# Patient Record
Sex: Female | Born: 1965 | Race: White | Hispanic: No | Marital: Married | Smoking: Never smoker
Health system: Southern US, Community
[De-identification: ages and names within clinical notes are randomized; demographics above are authoritative.]

## PROBLEM LIST (undated history)

## (undated) ENCOUNTER — Ambulatory Visit: Payer: PRIVATE HEALTH INSURANCE

## (undated) ENCOUNTER — Encounter

## (undated) ENCOUNTER — Ambulatory Visit

## (undated) ENCOUNTER — Ambulatory Visit: Payer: PRIVATE HEALTH INSURANCE | Attending: Ambulatory Care | Primary: Ambulatory Care

## (undated) ENCOUNTER — Ambulatory Visit: Payer: PRIVATE HEALTH INSURANCE | Attending: Family Medicine | Primary: Family Medicine

## (undated) ENCOUNTER — Ambulatory Visit: Payer: PRIVATE HEALTH INSURANCE | Attending: Registered" | Primary: Registered"

## (undated) ENCOUNTER — Encounter: Attending: Family | Primary: Family

## (undated) ENCOUNTER — Encounter: Attending: Family Medicine | Primary: Family Medicine

## (undated) ENCOUNTER — Ambulatory Visit: Payer: PRIVATE HEALTH INSURANCE | Attending: Gastroenterology | Primary: Gastroenterology

## (undated) ENCOUNTER — Encounter
Attending: Student in an Organized Health Care Education/Training Program | Primary: Student in an Organized Health Care Education/Training Program

## (undated) ENCOUNTER — Ambulatory Visit
Payer: PRIVATE HEALTH INSURANCE | Attending: Rehabilitative and Restorative Service Providers" | Primary: Rehabilitative and Restorative Service Providers"

## (undated) ENCOUNTER — Telehealth

## (undated) ENCOUNTER — Ambulatory Visit: Payer: PRIVATE HEALTH INSURANCE | Attending: Nurse Practitioner | Primary: Nurse Practitioner

## (undated) ENCOUNTER — Ambulatory Visit
Payer: PRIVATE HEALTH INSURANCE | Attending: Student in an Organized Health Care Education/Training Program | Primary: Student in an Organized Health Care Education/Training Program

## (undated) ENCOUNTER — Ambulatory Visit: Payer: MEDICARE

## (undated) ENCOUNTER — Ambulatory Visit: Payer: PRIVATE HEALTH INSURANCE | Attending: Family | Primary: Family

## (undated) ENCOUNTER — Ambulatory Visit: Attending: Pharmacist | Primary: Pharmacist

## (undated) DIAGNOSIS — E785 Hyperlipidemia, unspecified: Secondary | ICD-10-CM

## (undated) DIAGNOSIS — E119 Type 2 diabetes mellitus without complications: Secondary | ICD-10-CM

## (undated) DIAGNOSIS — N059 Unspecified nephritic syndrome with unspecified morphologic changes: Secondary | ICD-10-CM

## (undated) DIAGNOSIS — E039 Hypothyroidism, unspecified: Secondary | ICD-10-CM

## (undated) HISTORY — DX: Hypothyroidism, unspecified: E03.9

## (undated) HISTORY — PX: KNEE SURGERY: SHX244

## (undated) HISTORY — PX: ABDOMINAL HYSTERECTOMY: SHX81

## (undated) HISTORY — PX: CHOLECYSTECTOMY: SHX55

## (undated) HISTORY — PX: SINUS EXPLORATION: SHX5214

## (undated) HISTORY — DX: Hyperlipidemia, unspecified: E78.5

---

## 2018-07-29 ENCOUNTER — Encounter (HOSPITAL_COMMUNITY): Payer: Self-pay

## 2018-07-29 ENCOUNTER — Emergency Department (HOSPITAL_COMMUNITY)
Admission: EM | Admit: 2018-07-29 | Discharge: 2018-07-29 | Disposition: A | Discharge status: Home / Self Care | Payer: Self-pay | Attending: Emergency Medicine | Admitting: Emergency Medicine

## 2018-07-29 ENCOUNTER — Other Ambulatory Visit: Payer: Self-pay

## 2018-07-29 ENCOUNTER — Emergency Department (HOSPITAL_COMMUNITY): Payer: Self-pay

## 2018-07-29 DIAGNOSIS — N10 Acute tubulo-interstitial nephritis: Secondary | ICD-10-CM | POA: Insufficient documentation

## 2018-07-29 DIAGNOSIS — R519 Headache, unspecified: Secondary | ICD-10-CM

## 2018-07-29 DIAGNOSIS — E119 Type 2 diabetes mellitus without complications: Secondary | ICD-10-CM | POA: Insufficient documentation

## 2018-07-29 DIAGNOSIS — R42 Dizziness and giddiness: Secondary | ICD-10-CM | POA: Insufficient documentation

## 2018-07-29 DIAGNOSIS — N12 Tubulo-interstitial nephritis, not specified as acute or chronic: Secondary | ICD-10-CM

## 2018-07-29 HISTORY — DX: Unspecified nephritic syndrome with unspecified morphologic changes: N05.9

## 2018-07-29 HISTORY — DX: Type 2 diabetes mellitus without complications: E11.9

## 2018-07-29 LAB — URINALYSIS, ROUTINE W REFLEX MICROSCOPIC
Bilirubin Urine: NEGATIVE
Glucose, UA: >=500 mg/dL — AB
Hgb urine dipstick: NEGATIVE
Ketones, ur: 20 mg/dL — AB
Nitrite: POSITIVE — AB
Protein, ur: NEGATIVE mg/dL
Specific Gravity, Urine: 1.031 — ABNORMAL HIGH (ref 1.005–1.030)
WBC, UA: >50 WBC/hpf — ABNORMAL HIGH (ref 0–5)
pH: 5 (ref 5.0–8.0)

## 2018-07-29 LAB — CBC WITH DIFFERENTIAL/PLATELET
Abs Immature Granulocytes: 0.04 10*3/uL (ref 0.00–0.07)
Basophils Absolute: 0.1 10*3/uL (ref 0.0–0.1)
Basophils Relative: 1 %
Eosinophils Absolute: 0.4 10*3/uL (ref 0.0–0.5)
Eosinophils Relative: 5 %
HCT: 43.3 % (ref 36.0–46.0)
Hemoglobin: 14.8 g/dL (ref 12.0–15.0)
Immature Granulocytes: 1 %
Lymphocytes Relative: 26 %
Lymphs Abs: 1.9 10*3/uL (ref 0.7–4.0)
MCH: 29 pg (ref 26.0–34.0)
MCHC: 34.2 g/dL (ref 30.0–36.0)
MCV: 84.9 fL (ref 80.0–100.0)
Monocytes Absolute: 0.6 10*3/uL (ref 0.1–1.0)
Monocytes Relative: 8 %
Neutro Abs: 4.4 10*3/uL (ref 1.7–7.7)
Neutrophils Relative %: 59 %
Platelets: 225 10*3/uL (ref 150–400)
RBC: 5.1 MIL/uL (ref 3.87–5.11)
RDW: 13.2 % (ref 11.5–15.5)
WBC: 7.4 10*3/uL (ref 4.0–10.5)
nRBC: 0 % (ref 0.0–0.2)

## 2018-07-29 LAB — COMPREHENSIVE METABOLIC PANEL
ALT: 100 U/L — ABNORMAL HIGH (ref 0–44)
AST: 55 U/L — ABNORMAL HIGH (ref 15–41)
Albumin: 4.3 g/dL (ref 3.5–5.0)
Alkaline Phosphatase: 173 U/L — ABNORMAL HIGH (ref 38–126)
Anion gap: 11 (ref 5–15)
BUN: 13 mg/dL (ref 6–20)
CO2: 21 mmol/L — ABNORMAL LOW (ref 22–32)
Calcium: 9.4 mg/dL (ref 8.9–10.3)
Chloride: 103 mmol/L (ref 98–111)
Creatinine, Ser: 0.69 mg/dL (ref 0.44–1.00)
GFR calc Af Amer: >60 mL/min (ref >60)
GFR calc non Af Amer: >60 mL/min (ref >60)
Glucose, Bld: 264 mg/dL — ABNORMAL HIGH (ref 70–99)
Potassium: 3.8 mmol/L (ref 3.5–5.1)
Sodium: 135 mmol/L (ref 135–145)
Total Bilirubin: 1 mg/dL (ref 0.3–1.2)
Total Protein: 7.5 g/dL (ref 6.5–8.1)

## 2018-07-29 LAB — LIPASE, BLOOD: Lipase: 38 U/L (ref 11–51)

## 2018-07-29 MED ORDER — SODIUM CHLORIDE 0.9 % IV BOLUS
1000.0000 mL | Freq: Once | INTRAVENOUS | Status: AC
  Administered 2018-07-29: 1000 mL via INTRAVENOUS

## 2018-07-29 MED ORDER — CEPHALEXIN 500 MG PO CAPS: 500.0000 mg | ORAL_CAPSULE | Freq: Four times a day (QID) | ORAL | 0 refills | Status: DC

## 2018-07-29 MED ORDER — ONDANSETRON HCL 4 MG/2ML IJ SOLN
4.0000 mg | Freq: Once | INTRAMUSCULAR | Status: AC
  Administered 2018-07-29: 4 mg via INTRAVENOUS
  Filled 2018-07-29: qty 2

## 2018-07-29 MED ORDER — ONDANSETRON HCL 4 MG PO TABS: 4.0000 mg | ORAL_TABLET | Freq: Four times a day (QID) | ORAL | 0 refills | Status: DC

## 2018-07-29 NOTE — ED Provider Notes (Signed)
MSE was initiated and I personally evaluated the patient and placed orders (if any) at  2:41 PM on July 29, 2018.  The patient appears stable so that the remainder of the MSE may be completed by another provider.  Reports that she has had a headache and dizziness.  No fevers that she is aware of.  No visual changes.  Nausea without vomiting.  No focal weakness numbness tingling or gait incoordination.  He does not typically get headaches.  Patient is alert and appropriate.  Mental status clear.  All movements are coordinated purposeful symmetric without difficulty.  We will proceed with CT head for new headache with dizziness.  Further diagnostic evaluation per oncoming provider.   Charlesetta Shanks, MD 07/29/18 [phone]

## 2018-07-29 NOTE — ED Notes (Signed)
Patient verbalizes understanding of discharge instructions. Opportunity for questioning and answers were provided.  pt discharged from ED. Patient ambulatory by self.

## 2018-07-29 NOTE — ED Triage Notes (Signed)
Patient arrive with complaints of headache/dizziness x3 weeks. Getting worse the past few days. Patient currently made a move from [place] so she contributed it to stress.

## 2018-07-29 NOTE — ED Provider Notes (Signed)
[facility] Provider Note   CSN: [id number] Arrival date & time: 07/29/18  1305     History   Chief Complaint Chief Complaint  Patient presents with  . Dizziness  . Headache    HPI Whitney Thornton is a 53 y.o. female.     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  [place] proximally 2 to 3 weeks ago and states her symptoms have just been getting worse since her move.  She starts by saying that she is having a frontal headache that is pressure and throbbing but does not radiate.  The headache is present all day every day but some days is worse than others.  She also complains of feeling dizzy like things are spinning or out of balance but it can happen with turning over in bed or standing and walking.  She is also been a lot more congested since he may downgrade states she suffers from allergies and had symptoms like this in the past with sinus infections when she lived in [place] but they resolved after having sinus surgery.  She denies difficulty with her speech, visual changes, difficulty walking, unilateral numbness or weakness. Secondly patient is also having problems with pain in her right side of her back.  She is not completely sure when this started but states it is been over the last week or 2.  She denies any dysuria, frequency or urgency.  Approximately 3 weeks ago she had a yeast infection but states fluconazole so got appointment in the discharge away.  In the last 3 days she has had diarrhea, nausea and some abdominal discomfort.  Possible subjective fever but no cough or chest pain.  Patient denies any exposure to ticks and has had no significant medication changes.  She does say that she has not been eating well and is not sure  what her sugar is but she continues to take it every morning and glipizide.    The history is provided by the patient.  Dizziness Associated symptoms: headaches   Headache Associated symptoms: dizziness     Past Medical History:  Diagnosis Date  . Diabetes mellitus without complication (HCC)   . Nephritis     There are no active problems to display for this patient.      OB History   No obstetric history on file.      Home Medications    Prior to Admission medications   Not on File    Family History No family history on file.  Social History Social History   Tobacco Use  . Smoking status: Never Smoker  . Smokeless tobacco: Never Used  Substance Use Topics  . Alcohol use: Not Currently  . Drug use: Not Currently     Allergies   Celebrex [celecoxib]   Review of Systems Review of Systems  Neurological: Positive for dizziness and headaches.  All other systems reviewed and are negative.    Physical Exam Updated Vital Signs BP 121/74   Pulse 69   Temp 98.1 F (36.7 C) (Oral)   Resp 16   Ht 5\' 6"  (1.676 m)   Wt 86.2 kg   SpO2 93%   BMI 30.67 kg/m   Physical Exam Vitals signs and nursing note reviewed.  Constitutional:      General: She is not in acute  distress.    Appearance: She is well-developed.  HENT:     Head: Normocephalic and atraumatic.     Right Ear: A middle ear effusion is present. Tympanic membrane is not injected or perforated.     Left Ear: A middle ear effusion is present. Tympanic membrane is not injected or perforated.  Eyes:     Pupils: Pupils are equal, round, and reactive to light.  Cardiovascular:     Rate and Rhythm: Normal rate and regular rhythm.     Heart sounds: Normal heart sounds. No murmur. No friction rub.  Pulmonary:     Effort: Pulmonary effort is normal.     Breath sounds: Normal breath sounds. No wheezing or rales.  Abdominal:     General: Bowel sounds are normal. There is no distension.      Palpations: Abdomen is soft.     Tenderness: There is abdominal tenderness in the right upper quadrant and epigastric area. There is no guarding or rebound.    Musculoskeletal: Normal range of motion.        General: No tenderness.     Right lower leg: No edema.     Left lower leg: No edema.     Comments: No edema  Skin:    General: Skin is warm and dry.     Capillary Refill: Capillary refill takes less than 2 seconds.     Findings: No rash.  Neurological:     General: No focal deficit present.     Mental Status: She is alert and oriented to person, place, and time. Mental status is at baseline.     Cranial Nerves: No cranial nerve deficit.     Sensory: No sensory deficit.     Motor: No weakness.  Psychiatric:        Mood and Affect: Mood normal.        Behavior: Behavior normal.        Thought Content: Thought content normal.      ED Treatments / Results  Labs (all labs ordered are listed, but only abnormal results are displayed) Labs Reviewed  COMPREHENSIVE METABOLIC PANEL - Abnormal; Notable for the following components:      Result Value   CO2 21 (*)    Glucose, Bld 264 (*)    AST 55 (*)    ALT 100 (*)    Alkaline Phosphatase 173 (*)    All other components within normal limits  URINALYSIS, ROUTINE W REFLEX MICROSCOPIC - Abnormal; Notable for the following components:   APPearance HAZY (*)    Specific Gravity, Urine 1.031 (*)    Glucose, UA >=500 (*)    Ketones, ur 20 (*)    Nitrite POSITIVE (*)    Leukocytes,Ua SMALL (*)    WBC, UA >50 (*)    Bacteria, UA MANY (*)    All other components within normal limits  URINE CULTURE  CBC WITH DIFFERENTIAL/PLATELET  LIPASE, BLOOD    EKG EKG Interpretation  Date/Time:  Saturday July 29 2018 15:58:07 EDT Ventricular Rate:  65 PR Interval:    QRS Duration: 111 QT Interval:  460 QTC Calculation: 479 R Axis:   63 Text Interpretation:  Sinus rhythm Borderline T abnormalities, anterior leads No previous tracing  Confirmed by Blanchie Dessert [phone]) on 07/29/2018 4:08:27 PM   Radiology Ct Head Wo Contrast  Result Date: 07/29/2018 CLINICAL DATA:  Headache and dizziness.  Nausea for several days. EXAM: CT HEAD WITHOUT CONTRAST TECHNIQUE: Contiguous axial images were obtained from  the base of the skull through the vertex without intravenous contrast. COMPARISON:  None. FINDINGS: Brain: No subdural, epidural, or subarachnoid hemorrhage identified. Cerebellum, brainstem, and basal cisterns are normal. Ventricles and sulci are unremarkable. No acute cortical ischemia or infarct identified. Vascular: No hyperdense vessel or unexpected calcification. Skull: Normal. Negative for fracture or focal lesion. Sinuses/Orbits: Significant opacification is seen in several right ethmoid air cells in the right maxillary sinus. No air-fluid levels are seen within the paranasal sinuses. Mastoid air cells and middle ears are well aerated. Other: None. IMPRESSION: 1. Sinus disease as above. 2. No acute intracranial abnormalities are identified. Electronically Signed   By: Gerome Samavid  Williams III M.D   On: 07/29/2018 16:56    Procedures Procedures (including critical care time)  Medications Ordered in ED Medications  ondansetron (ZOFRAN) injection 4 mg (has no administration in time range)  sodium chloride 0.9 % bolus 1,000 mL (has no administration in time range)     Initial Impression / Assessment and Plan / ED Course  I have reviewed the triage vital signs and the nursing notes.  Pertinent labs & imaging results that were available during my care of the patient were reviewed by me and considered in my medical decision making (see chart for details).        53 year old female presenting with multiple vague complaints.  Patient's headache is new for her with the dizziness but states she used to get these when she had sinus infections.  [place] is recently [place] here and is complaining of congestion.  Suspect  most likely this is related to allergies and sinus congestion however will do a CT to ensure no other acute findings such as space-occupying lesion or elevated intracranial pressure.  Secondly patient is complaining of pain in her right thigh going into her back with nausea, diarrhea and subjective fevers.  Patient does have right upper quadrant pain on exam this status post cholecystectomy.  Possible pyelonephritis.  Versus hepatitis versus gastroenteritis versus no diabetic complication.  She has no evidence of DKA at this time and vital signs are within normal limits..  She is denying any upper respiratory symptoms return low suspicion for pneumonia.  Patient is not complaining of cardiac symptoms suggestive of ACS, dissection and low suspicion for PE.  Patient's history of prior chronic UTIs resulting in pyelonephritis and sepsis will evaluate to ensure this is not what is causing her to not feel well. Labs, Zofran, IV fluids given.    5:36 PM Patient CBC is within normal limits, CMP with mild elevated ALT of 100, normal bilirubin and blood sugar of 264 but normal anion gap is within normal limits.  UA with positive nitrites, leukocytes and greater than 50 white blood cells and concerned that she has pyelonephritis causing her pain.  Patient is tolerating p.o.'s and feel that she would be a candidate for oral antibiotics and will start her on Keflex.  Head CT shows sinus disease but otherwise negative.  Patient was instructed to use Claritin or Zyrtec as well as Flonase and saline spray.  She was encouraged to follow-up with her doctor by Wednesday for recheck or return if symptoms worsen.  Final Clinical Impressions(s) / ED Diagnoses   Final diagnoses:  Pyelonephritis  Sinus headache  Vertigo    ED Discharge Orders         Ordered    cephALEXin (KEFLEX) 500 MG capsule  4 times daily     07/29/18 1740    ondansetron (ZOFRAN)  4 MG tablet  Every 6 hours     07/29/18 1740            Gwyneth SproutPlunkett, Maritza Goldsborough, MD 07/29/18 1741

## 2018-07-31 LAB — URINE CULTURE: Culture: >=100000 — AB

## 2018-08-01 ENCOUNTER — Telehealth: Payer: Self-pay

## 2018-08-01 NOTE — Telephone Encounter (Signed)
Post ED Visit - Positive Culture Follow-up  Culture report reviewed by antimicrobial stewardship pharmacist: [place] Team []  Elenor Quinones, Pharm.D. []  Heide Guile, Pharm.D., BCPS AQ-ID [x]  Parks Neptune, Pharm.D., BCPS []  Alycia Rossetti, Pharm.D., BCPS []  [place], Pharm.D., BCPS, AAHIVP []  Legrand Como, Pharm.D., BCPS, AAHIVP []  Salome Arnt, PharmD, BCPS []  Johnnette Gourd, PharmD, BCPS []  Hughes Better, PharmD, BCPS []  Leeroy Cha, PharmD []  Laqueta Linden, PharmD, BCPS []  Albertina Parr, PharmD  [place] Team []  Leodis Sias, PharmD []  Lindell Spar, PharmD []  Royetta Asal, PharmD []  Graylin Shiver, Rph []  Rema Fendt) Glennon Mac, PharmD []  Arlyn Dunning, PharmD []  Netta Cedars, PharmD []  Dia Sitter, PharmD []  Leone Haven, PharmD []  Gretta Arab, PharmD []  Theodis Shove, PharmD []  Peggyann Juba, PharmD []  Reuel Boom, PharmD   Positive urine culture Treated with Cephalexin, organism sensitive to the same and no further patient follow-up is required at this time.  Genia Del 08/01/2018, 11:50 AM

## 2018-10-15 ENCOUNTER — Emergency Department (HOSPITAL_COMMUNITY): Payer: Self-pay

## 2018-10-15 ENCOUNTER — Emergency Department (HOSPITAL_COMMUNITY)
Admission: EM | Admit: 2018-10-15 | Discharge: 2018-10-15 | Disposition: A | Discharge status: Home / Self Care | Payer: Self-pay | Attending: Emergency Medicine | Admitting: Emergency Medicine

## 2018-10-15 ENCOUNTER — Other Ambulatory Visit: Payer: Self-pay

## 2018-10-15 ENCOUNTER — Encounter (HOSPITAL_COMMUNITY): Payer: Self-pay | Admitting: Emergency Medicine

## 2018-10-15 DIAGNOSIS — E119 Type 2 diabetes mellitus without complications: Secondary | ICD-10-CM | POA: Insufficient documentation

## 2018-10-15 DIAGNOSIS — Z79899 Other long term (current) drug therapy: Secondary | ICD-10-CM | POA: Insufficient documentation

## 2018-10-15 DIAGNOSIS — N3000 Acute cystitis without hematuria: Secondary | ICD-10-CM | POA: Insufficient documentation

## 2018-10-15 DIAGNOSIS — Z7982 Long term (current) use of aspirin: Secondary | ICD-10-CM | POA: Insufficient documentation

## 2018-10-15 DIAGNOSIS — Z7984 Long term (current) use of oral hypoglycemic drugs: Secondary | ICD-10-CM | POA: Insufficient documentation

## 2018-10-15 DIAGNOSIS — R0789 Other chest pain: Secondary | ICD-10-CM | POA: Insufficient documentation

## 2018-10-15 LAB — BASIC METABOLIC PANEL
Anion gap: 12 (ref 5–15)
BUN: 15 mg/dL (ref 6–20)
CO2: 19 mmol/L — ABNORMAL LOW (ref 22–32)
Calcium: 10.1 mg/dL (ref 8.9–10.3)
Chloride: 106 mmol/L (ref 98–111)
Creatinine, Ser: 0.75 mg/dL (ref 0.44–1.00)
GFR calc Af Amer: >60 mL/min (ref >60)
GFR calc non Af Amer: >60 mL/min (ref >60)
Glucose, Bld: 205 mg/dL — ABNORMAL HIGH (ref 70–99)
Potassium: 3.7 mmol/L (ref 3.5–5.1)
Sodium: 137 mmol/L (ref 135–145)

## 2018-10-15 LAB — I-STAT BETA HCG BLOOD, ED (MC, WL, AP ONLY): I-stat hCG, quantitative: <5 m[IU]/mL (ref <5)

## 2018-10-15 LAB — HEPATIC FUNCTION PANEL
ALT: 149 U/L — ABNORMAL HIGH (ref 0–44)
AST: 67 U/L — ABNORMAL HIGH (ref 15–41)
Albumin: 4.8 g/dL (ref 3.5–5.0)
Alkaline Phosphatase: 177 U/L — ABNORMAL HIGH (ref 38–126)
Bilirubin, Direct: 0.1 mg/dL (ref 0.0–0.2)
Indirect Bilirubin: 0.9 mg/dL (ref 0.3–0.9)
Total Bilirubin: 1 mg/dL (ref 0.3–1.2)
Total Protein: 8.1 g/dL (ref 6.5–8.1)

## 2018-10-15 LAB — URINALYSIS, ROUTINE W REFLEX MICROSCOPIC
Bilirubin Urine: NEGATIVE
Glucose, UA: >=500 mg/dL — AB
Hgb urine dipstick: NEGATIVE
Ketones, ur: NEGATIVE mg/dL
Nitrite: POSITIVE — AB
Protein, ur: NEGATIVE mg/dL
Specific Gravity, Urine: 1.029 (ref 1.005–1.030)
pH: 5 (ref 5.0–8.0)

## 2018-10-15 LAB — CBC
HCT: 46.2 % — ABNORMAL HIGH (ref 36.0–46.0)
Hemoglobin: 15.2 g/dL — ABNORMAL HIGH (ref 12.0–15.0)
MCH: 29.2 pg (ref 26.0–34.0)
MCHC: 32.9 g/dL (ref 30.0–36.0)
MCV: 88.8 fL (ref 80.0–100.0)
Platelets: 250 10*3/uL (ref 150–400)
RBC: 5.2 MIL/uL — ABNORMAL HIGH (ref 3.87–5.11)
RDW: 13.3 % (ref 11.5–15.5)
WBC: 7.5 10*3/uL (ref 4.0–10.5)
nRBC: 0 % (ref 0.0–0.2)

## 2018-10-15 LAB — TROPONIN I (HIGH SENSITIVITY)
Troponin I (High Sensitivity): 2 ng/L (ref <18)
Troponin I (High Sensitivity): 2 ng/L (ref <18)

## 2018-10-15 LAB — LACTIC ACID, PLASMA: Lactic Acid, Venous: 1.1 mmol/L (ref 0.5–1.9)

## 2018-10-15 MED ORDER — IOHEXOL 350 MG/ML SOLN
100.0000 mL | Freq: Once | INTRAVENOUS | Status: AC | PRN
  Administered 2018-10-15: 100 mL via INTRAVENOUS

## 2018-10-15 MED ORDER — SODIUM CHLORIDE (PF) 0.9 % IJ SOLN
INTRAMUSCULAR | Status: AC
  Filled 2018-10-15: qty 50

## 2018-10-15 MED ORDER — CEPHALEXIN 500 MG PO CAPS
500.0000 mg | ORAL_CAPSULE | Freq: Once | ORAL | Status: AC
  Administered 2018-10-15: 500 mg via ORAL
  Filled 2018-10-15: qty 1

## 2018-10-15 MED ORDER — ONDANSETRON HCL 4 MG/2ML IJ SOLN
4.0000 mg | Freq: Once | INTRAMUSCULAR | Status: AC
  Administered 2018-10-15: 4 mg via INTRAVENOUS
  Filled 2018-10-15: qty 2

## 2018-10-15 MED ORDER — CEPHALEXIN 500 MG PO CAPS: 500.0000 mg | ORAL_CAPSULE | Freq: Four times a day (QID) | ORAL | 0 refills | Status: DC

## 2018-10-15 NOTE — Discharge Instructions (Signed)
CAT scan today showed no sign of aortic dissection, blood clots and all your lab work shows no sign of heart attack.  No sign of kidney infection today.  Your liver tests continue to be mildly elevated and you will need to follow-up with your regular doctor about this.  You also have a urinary tract infection today and had a prescription sent to your pharmacy.

## 2018-10-15 NOTE — ED Provider Notes (Signed)
[place] DEPT Provider Note   CSN: [id number] Arrival date & time: 10/15/18  1341     History   Chief Complaint Chief Complaint  Patient presents with  . chest pain  . Back Pain  . Nausea    HPI Whitney Thornton is a 53 y.o. female.     Patient is a 53 year old female with a history of diabetes, ongoing nephritis and recurrent UTIs with pyelonephritis on trimethoprim as a suppressive agent who is presenting today with complaint of not feeling well for the last 3 days and worsening today.  Patient states over the last 2 days she is felt myalgias, chills, nausea and general malaise.  This morning when she woke up she had pain in the left side of her chest and feelings of shortness of breath.  When she takes a deep breath she feels slightly lightheaded but denies any syncope.  She has been able to ambulate without difficulty.  She is also complaining of back pain and in the last few days have also noticed urinary frequency, urgency and a foul smell.  Patient's blood sugar has been in the 160s she continues to take her metformin and glipizide.  She has a significant family history of multiple family members her dad, grandmother and brother all with aortic dissections and concern for possible connective tissue disorder in their family.  She does not smoke and has no known lung disease or heart disease.  Today she her blood pressure has been elevated which she says is very unusual for her.  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  He denies cough, congestion and has not had any abdominal pain or vomiting.  The history is provided by the patient.  Back Pain   Past Medical History:  Diagnosis Date  . Diabetes mellitus without complication ([place])   . Nephritis     There are no active problems to display for this patient.   Past Surgical History:  Procedure  Laterality Date  . ABDOMINAL HYSTERECTOMY    . CESAREAN SECTION    . CHOLECYSTECTOMY    . KNEE SURGERY Bilateral   . SINUS EXPLORATION     sinus surgery     OB History   No obstetric history on file.      Home Medications    Prior to Admission medications   Medication Sig Start Date End Date Taking? Authorizing Provider  aspirin EC 81 MG tablet Take 81 mg by mouth at bedtime.    [provider]  cephALEXin (KEFLEX) 500 MG capsule Take 1 capsule (500 mg total) by mouth 4 (four) times daily. 07/29/18   Blanchie Dessert, MD  diphenhydramine-acetaminophen (TYLENOL PM) 25-500 MG TABS tablet Take 1 tablet by mouth at bedtime as needed (sleep/pain).    [provider]  glipiZIDE (GLUCOTROL XL) 2.5 MG 24 hr tablet Take 2.5 mg by mouth 2 (two) times daily with a meal.    [provider]  metFORMIN (GLUCOPHAGE-XR) 500 MG 24 hr tablet Take 500 mg by mouth 2 (two) times daily with a meal.    [provider]  ondansetron (ZOFRAN) 4 MG tablet Take 1 tablet (4 mg total) by mouth every 6 (six) hours. 07/29/18   Blanchie Dessert, MD  simvastatin (ZOCOR) 40 MG tablet Take 40 mg by mouth at bedtime.    [provider]  trimethoprim (TRIMPEX) 100 MG tablet Take 100 mg  by mouth at bedtime.    [provider]    Family History No family history on file.  Social History Social History   Tobacco Use  . Smoking status: Never Smoker  . Smokeless tobacco: Never Used  Substance Use Topics  . Alcohol use: Not Currently  . Drug use: Not Currently     Allergies   Celebrex [celecoxib]   Review of Systems Review of Systems  Musculoskeletal: Positive for back pain.  All other systems reviewed and are negative.    Physical Exam Updated Vital Signs BP (!) 147/78 (BP Location: Left Arm)   Pulse 72   Temp 98 F (36.7 C) (Oral)   Resp 18   SpO2 96%   Physical Exam Vitals signs and nursing note reviewed.  Constitutional:      General:  She is not in acute distress.    Appearance: She is well-developed and normal weight.  HENT:     Head: Normocephalic and atraumatic.     Mouth/Throat:     Mouth: Mucous membranes are moist.  Eyes:     Pupils: Pupils are equal, round, and reactive to light.  Cardiovascular:     Rate and Rhythm: Normal rate and regular rhythm.     Heart sounds: Normal heart sounds. No murmur. No friction rub.  Pulmonary:     Effort: Pulmonary effort is normal.     Breath sounds: Normal breath sounds. No wheezing or rales.  Chest:     Chest wall: No tenderness.  Abdominal:     General: Bowel sounds are normal. There is no distension.     Palpations: Abdomen is soft.     Tenderness: There is no abdominal tenderness. There is right CVA tenderness and left CVA tenderness. There is no guarding or rebound.  Musculoskeletal: Normal range of motion.        General: No tenderness.     Right lower leg: No edema.     Left lower leg: No edema.     Comments: No edema  Skin:    General: Skin is warm and dry.     Findings: No rash.  Neurological:     General: No focal deficit present.     Mental Status: She is alert and oriented to person, place, and time. Mental status is at baseline.     Cranial Nerves: No cranial nerve deficit.  Psychiatric:        Mood and Affect: Mood normal.        Behavior: Behavior normal.        Thought Content: Thought content normal.      ED Treatments / Results  Labs (all labs ordered are listed, but only abnormal results are displayed) Labs Reviewed  BASIC METABOLIC PANEL - Abnormal; Notable for the following components:      Result Value   CO2 19 (*)    Glucose, Bld 205 (*)    All other components within normal limits  CBC - Abnormal; Notable for the following components:   RBC 5.20 (*)    Hemoglobin 15.2 (*)    HCT 46.2 (*)    All other components within normal limits  HEPATIC FUNCTION PANEL - Abnormal; Notable for the following components:   AST 67 (*)    ALT  149 (*)    Alkaline Phosphatase 177 (*)    All other components within normal limits  URINALYSIS, ROUTINE W REFLEX MICROSCOPIC - Abnormal; Notable for the following components:   Color, Urine AMBER (*)  APPearance CLOUDY (*)    Glucose, UA >=500 (*)    Nitrite POSITIVE (*)    Leukocytes,Ua TRACE (*)    Bacteria, UA MANY (*)    All other components within normal limits  URINE CULTURE  LACTIC ACID, PLASMA  I-STAT BETA HCG BLOOD, ED ([facility], [facility], [facility] ONLY)  TROPONIN I (HIGH SENSITIVITY)  TROPONIN I (HIGH SENSITIVITY)    EKG EKG Interpretation  Date/Time:  Sunday October 15 2018 13:50:27 EDT Ventricular Rate:  75 PR Interval:    QRS Duration: 111 QT Interval:  412 QTC Calculation: 461 R Axis:   31 Text Interpretation:  Sinus rhythm Borderline low voltage, extremity leads Baseline wander in lead(s) II III aVF No significant change since last tracing Confirmed by Gwyneth Sproutlunkett, Daneil Beem ([id number]) on 10/15/2018 5:49:37 PM   Radiology Dg Chest 2 View  Result Date: 10/15/2018 CLINICAL DATA:  Chest pain. EXAM: CHEST - 2 VIEW COMPARISON:  None. FINDINGS: The heart size and mediastinal contours are within normal limits. Both lungs are clear. The visualized skeletal structures are unremarkable. IMPRESSION: No active cardiopulmonary disease. Electronically Signed   By: Gerome Samavid  Williams III M.D   On: 10/15/2018 14:58   Ct Angio Chest/abd/pel For Dissection W And/or Wo Contrast  Result Date: 10/15/2018 CLINICAL DATA:  Chest pain.  Evaluate for dissection. EXAM: CT ANGIOGRAPHY CHEST, ABDOMEN AND PELVIS TECHNIQUE: Multidetector CT imaging through the chest, abdomen and pelvis was performed using the standard protocol during bolus administration of intravenous contrast. Multiplanar reconstructed images and MIPs were obtained and reviewed to evaluate the vascular anatomy. CONTRAST:  100mL OMNIPAQUE IOHEXOL 350 MG/ML SOLN COMPARISON:  None FINDINGS: CTA CHEST FINDINGS Cardiovascular: Preferential opacification  of the thoracic aorta. No evidence of thoracic aortic aneurysm or dissection. Normal heart size. No pericardial effusion. Mediastinum/Nodes: Asymmetric enlargement of the right lobe of thyroid gland. The trachea appears patent and is midline. Normal appearance of the esophagus. Lungs/Pleura: No pleural effusion. No airspace consolidation, atelectasis or pneumothorax. 2 mm nodule in the right middle lobe, image 70/5. Small calcified granuloma in the posterior right lung base. Musculoskeletal: There is a focal well-defined sclerotic lesion involving the lateral aspect of the 8 and sixth ribs. Review of the MIP images confirms the above findings. CTA ABDOMEN AND PELVIS FINDINGS VASCULAR Aorta: Normal caliber aorta without aneurysm, dissection, vasculitis or significant stenosis. Mild aortic atherosclerosis Celiac: Patent without evidence of aneurysm, dissection, vasculitis or significant stenosis. SMA: Patent without evidence of aneurysm, dissection, vasculitis or significant stenosis. Renals: Both renal arteries are patent without evidence of aneurysm, dissection, vasculitis, fibromuscular dysplasia or significant stenosis. IMA: Patent without evidence of aneurysm, dissection, vasculitis or significant stenosis. Inflow: Patent without evidence of aneurysm, dissection, vasculitis or significant stenosis. Veins: No obvious venous abnormality within the limitations of this arterial phase study. Review of the MIP images confirms the above findings. NON-VASCULAR Hepatobiliary: No focal liver abnormality is seen. Status post cholecystectomy. No biliary dilatation. Pancreas: Unremarkable. No pancreatic ductal dilatation or surrounding inflammatory changes. Spleen: Normal in size without focal abnormality. Adrenals/Urinary Tract: Adrenal glands are unremarkable. Kidneys are normal, without renal calculi, focal lesion, or hydronephrosis. Bladder is unremarkable. Stomach/Bowel: Stomach is within normal limits. Appendix appears  normal. No evidence of bowel wall thickening, distention, or inflammatory changes. Lymphatic: No significant vascular findings are present. No enlarged abdominal or pelvic lymph nodes. Reproductive: Status post hysterectomy. No adnexal masses. Other: No abdominal wall hernia or abnormality. No abdominopelvic ascites. Musculoskeletal: No acute or significant osseous findings. Review of the MIP images confirms  the above findings. IMPRESSION: 1. No evidence for aortic dissection. 2. No acute cardiopulmonary abnormalities and no acute findings within the abdomen or pelvis. 3. Status post cholecystectomy. 4. 2 mm right middle lobe lung nodule. No follow-up needed if patient is low-risk. Non-contrast chest CT can be considered in 12 months if patient is high-risk. This recommendation follows the consensus statement: Guidelines for Management of Incidental Pulmonary Nodules Detected on CT Images: From the Fleischner Society 2017; Radiology 2017; 284:228-243. Electronically Signed   By: Signa Kell M.D.   On: 10/15/2018 19:42    Procedures Procedures (including critical care time)  Medications Ordered in ED Medications  ondansetron (ZOFRAN) injection 4 mg (has no administration in time range)     Initial Impression / Assessment and Plan / ED Course  I have reviewed the triage vital signs and the nursing notes.  Pertinent labs & imaging results that were available during my care of the patient were reviewed by me and considered in my medical decision making (see chart for details).        53 year old female presenting today with multiple vague symptoms.  She does have a significant history for recurrent UTIs resulting sometimes even in urosepsis and pyelonephritis.  Patient does take a suppressant antibiotic which is trimethoprim but states she seen urology in the past and various other people over the last year and nobody seems to be able to tell her why she continues to get infections.  In the last  few days she has had urinary frequency urgency and a change in smell.  She is also had nausea but no vomiting.  Concern for possible pyelonephritis today.  Patient does not have criteria for sepsis at this time.  However secondly today she woke up and had sharp uncomfortable chest pain in the left side of her chest which has not gone away she feels like her breathing is just not normal.  Patient is mildly hypertensive here at 147/78 which she states is very unusual.  Satting 96% on room air and she is no acute distress at this time.  However patient has multiple family members who have had aortic dissection and it is unknown if there is a connective tissue disorder.  Nobody in her family with clots she does not take estrogens and has had no recent prolonged immobilization.  She has no leg pain or swelling.  Lower suspicion for PE.  Patient's EKG without acute findings, troponin is less than 2 and her symptoms have been ongoing all day long so feel that she is appropriately ruled out for ACS.  Concern for pyelonephritis, UTI, dissection.  CBC is within normal limits, BMP is within normal limits except for a blood sugar of 205 however LFTs were added.  UA is still pending.  Lactate pending.  CTA of the chest abdomen pelvis pending.  Chest x-ray within normal limits.  8:58 PM CT today shows no evidence of dissection or other acute abdominal process.  No evidence of pneumonia or PE.  Patient's LFTs are persistently elevated but only slightly above prior studies.  UA is concerning for potential UTI with 11-20 white blood cells many bacteria and nitrite positive.  Given patient's prior history of UTIs and symptoms will cover with antibiotics.  Patient's last culture was E. coli which was pansensitive.  Patient does not seem to have pyelonephritis at this time.  Will cover with Keflex.  Final Clinical Impressions(s) / ED Diagnoses   Final diagnoses:  Atypical chest pain  Acute cystitis  without hematuria    ED  Discharge Orders         Ordered    cephALEXin (KEFLEX) 500 MG capsule  4 times daily     10/15/18 2100           Gwyneth Sprout, MD 10/15/18 2100

## 2018-10-15 NOTE — ED Triage Notes (Signed)
Pt c/o chest pains that radiate to back with nausea that started earlier today.

## 2018-10-18 LAB — URINE CULTURE: Culture: >=100000 — AB

## 2018-10-19 ENCOUNTER — Telehealth: Payer: Self-pay

## 2018-10-19 NOTE — Telephone Encounter (Signed)
Post ED Visit - Positive Culture Follow-up  Culture report reviewed by antimicrobial stewardship pharmacist: [facility] [x]  Elenor Quinones, Pharm.D. []  Heide Guile, Pharm.D., BCPS AQ-ID []  Parks Neptune, Pharm.D., BCPS []  Alycia Rossetti, Pharm.D., BCPS []  [place], [place].D., BCPS, AAHIVP []  Legrand Como, Pharm.D., BCPS, AAHIVP []  Salome Arnt, PharmD, BCPS []  Johnnette Gourd, PharmD, BCPS []  Hughes Better, PharmD, BCPS []  Leeroy Cha, PharmD []  Laqueta Linden, PharmD, BCPS []  Albertina Parr, PharmD  [place] Team []  Leodis Sias, PharmD []  Lindell Spar, PharmD []  Royetta Asal, PharmD []  Graylin Shiver, Rph []  Rema Fendt) Glennon Mac, PharmD []  Arlyn Dunning, PharmD []  Netta Cedars, PharmD []  Dia Sitter, PharmD []  Leone Haven, PharmD []  Gretta Arab, PharmD []  Theodis Shove, PharmD []  Peggyann Juba, PharmD []  Reuel Boom, PharmD   Positive urine culture Treated with Cephalexin, organism sensitive to the same and no further patient follow-up is required at this time.  Genia Del 10/19/2018, 10:33 AM

## 2018-12-04 ENCOUNTER — Emergency Department (HOSPITAL_COMMUNITY): Payer: Self-pay

## 2018-12-04 ENCOUNTER — Emergency Department (HOSPITAL_COMMUNITY)
Admission: EM | Admit: 2018-12-04 | Discharge: 2018-12-04 | Disposition: A | Discharge status: Home / Self Care | Payer: Self-pay | Attending: Emergency Medicine | Admitting: Emergency Medicine

## 2018-12-04 ENCOUNTER — Other Ambulatory Visit: Payer: Self-pay

## 2018-12-04 DIAGNOSIS — E1165 Type 2 diabetes mellitus with hyperglycemia: Secondary | ICD-10-CM | POA: Insufficient documentation

## 2018-12-04 DIAGNOSIS — R42 Dizziness and giddiness: Secondary | ICD-10-CM | POA: Insufficient documentation

## 2018-12-04 DIAGNOSIS — Z7984 Long term (current) use of oral hypoglycemic drugs: Secondary | ICD-10-CM | POA: Insufficient documentation

## 2018-12-04 DIAGNOSIS — Y999 Unspecified external cause status: Secondary | ICD-10-CM | POA: Insufficient documentation

## 2018-12-04 DIAGNOSIS — W109XXA Fall (on) (from) unspecified stairs and steps, initial encounter: Secondary | ICD-10-CM | POA: Insufficient documentation

## 2018-12-04 DIAGNOSIS — Z7982 Long term (current) use of aspirin: Secondary | ICD-10-CM | POA: Insufficient documentation

## 2018-12-04 DIAGNOSIS — Z79899 Other long term (current) drug therapy: Secondary | ICD-10-CM | POA: Insufficient documentation

## 2018-12-04 DIAGNOSIS — Y939 Activity, unspecified: Secondary | ICD-10-CM | POA: Insufficient documentation

## 2018-12-04 DIAGNOSIS — Z20828 Contact with and (suspected) exposure to other viral communicable diseases: Secondary | ICD-10-CM | POA: Insufficient documentation

## 2018-12-04 DIAGNOSIS — Y929 Unspecified place or not applicable: Secondary | ICD-10-CM | POA: Insufficient documentation

## 2018-12-04 DIAGNOSIS — W19XXXA Unspecified fall, initial encounter: Secondary | ICD-10-CM

## 2018-12-04 DIAGNOSIS — J029 Acute pharyngitis, unspecified: Secondary | ICD-10-CM | POA: Insufficient documentation

## 2018-12-04 DIAGNOSIS — S99911A Unspecified injury of right ankle, initial encounter: Secondary | ICD-10-CM | POA: Insufficient documentation

## 2018-12-04 DIAGNOSIS — R739 Hyperglycemia, unspecified: Secondary | ICD-10-CM

## 2018-12-04 LAB — BASIC METABOLIC PANEL
Anion gap: 13 (ref 5–15)
BUN: 12 mg/dL (ref 6–20)
CO2: 22 mmol/L (ref 22–32)
Calcium: 9.3 mg/dL (ref 8.9–10.3)
Chloride: 100 mmol/L (ref 98–111)
Creatinine, Ser: 0.96 mg/dL (ref 0.44–1.00)
GFR calc Af Amer: >60 mL/min (ref >60)
GFR calc non Af Amer: >60 mL/min (ref >60)
Glucose, Bld: 328 mg/dL — ABNORMAL HIGH (ref 70–99)
Potassium: 4 mmol/L (ref 3.5–5.1)
Sodium: 135 mmol/L (ref 135–145)

## 2018-12-04 LAB — CBC
HCT: 44.2 % (ref 36.0–46.0)
Hemoglobin: 14.6 g/dL (ref 12.0–15.0)
MCH: 29.2 pg (ref 26.0–34.0)
MCHC: 33 g/dL (ref 30.0–36.0)
MCV: 88.4 fL (ref 80.0–100.0)
Platelets: 251 10*3/uL (ref 150–400)
RBC: 5 MIL/uL (ref 3.87–5.11)
RDW: 13.2 % (ref 11.5–15.5)
WBC: 7.8 10*3/uL (ref 4.0–10.5)
nRBC: 0 % (ref 0.0–0.2)

## 2018-12-04 LAB — CBG MONITORING, ED
Glucose-Capillary: 307 mg/dL — ABNORMAL HIGH (ref 70–99)
Glucose-Capillary: 225 mg/dL — ABNORMAL HIGH (ref 70–99)

## 2018-12-04 LAB — URINALYSIS, ROUTINE W REFLEX MICROSCOPIC
Bilirubin Urine: NEGATIVE
Glucose, UA: >=500 mg/dL — AB
Hgb urine dipstick: NEGATIVE
Ketones, ur: NEGATIVE mg/dL
Leukocytes,Ua: NEGATIVE
Nitrite: NEGATIVE
Protein, ur: NEGATIVE mg/dL
Specific Gravity, Urine: 1.027 (ref 1.005–1.030)
pH: 5 (ref 5.0–8.0)

## 2018-12-04 LAB — SARS CORONAVIRUS 2 (TAT 6-24 HRS): SARS Coronavirus 2: NEGATIVE

## 2018-12-04 LAB — GROUP A STREP BY PCR: Group A Strep by PCR: NOT DETECTED

## 2018-12-04 MED ORDER — GLIPIZIDE ER 2.5 MG PO TB24: 2.5000 mg | ORAL_TABLET | Freq: Two times a day (BID) | ORAL | 0 refills | Status: DC

## 2018-12-04 MED ORDER — METFORMIN HCL ER 500 MG PO TB24: 500.0000 mg | ORAL_TABLET | Freq: Two times a day (BID) | ORAL | 0 refills | Status: DC

## 2018-12-04 MED ORDER — ACETAMINOPHEN ER 650 MG PO TBCR: 650.0000 mg | EXTENDED_RELEASE_TABLET | Freq: Three times a day (TID) | ORAL | 0 refills | Status: DC | PRN

## 2018-12-04 MED ORDER — TRIMETHOPRIM 100 MG PO TABS: 100.0000 mg | ORAL_TABLET | Freq: Every day | ORAL | 0 refills | Status: DC

## 2018-12-04 MED ORDER — SODIUM CHLORIDE 0.9 % IV BOLUS
1000.0000 mL | Freq: Once | INTRAVENOUS | Status: AC
  Administered 2018-12-04: 1000 mL via INTRAVENOUS

## 2018-12-04 MED ORDER — ACETAMINOPHEN 325 MG PO TABS
650.0000 mg | ORAL_TABLET | Freq: Once | ORAL | Status: AC
  Administered 2018-12-04: 18:00:00 650 mg via ORAL
  Filled 2018-12-04: qty 2

## 2018-12-04 NOTE — ED Triage Notes (Signed)
Pt here for evaluation of R ankle pain, which she injured when walking down the steps. Pt missed a step and fell. Only other pain she endorses is chronic knee pain. Pt sts she also has a chronic kidney infection but over the last few days has had increased urination and burning/pain with urination. Also endorses sore throat but denies sick contacts.

## 2018-12-04 NOTE — Discharge Instructions (Addendum)
You were seen in the emergency department today after a fall.  Your x-ray did not show a fracture dislocation of the ankle.  Your labs were overall reassuring with the exception that your blood sugar was quite high in the 300s, this improved after fluids.  We are sending you home with an ankle brace to practice RICE protocol in terms of your ankle injury- please follow up with orthopedics within 1 week for re-evaluation.   We are sending you home with refills of your glipizide, Metformin, and trimethoprim for the next 30 days.  Please take these as prescribed and continue to monitor your blood sugar.  We tested you for Covid, we will call you if results are positive, if positive you will need to follow below quarantine instructions.  Please follow with primary care or [facility] within 1 week for reevaluation of your overall symptoms that brought you to the ER today.  Return to the ER for new or worsening symptoms or any other concerns.

## 2018-12-04 NOTE — Progress Notes (Signed)
Orthopedic Tech Progress Note Patient Details:  Whitney Thornton 09-02-1965 [id number]  Ortho Devices Type of Ortho Device: ASO Ortho Device/Splint Location: right ankle Ortho Device/Splint Interventions: Ordered, Application, Adjustment   Post Interventions Patient Tolerated: Well Instructions Provided: Adjustment of device, Care of device, Poper ambulation with device   Staci Righter 12/04/2018, 9:59 PM

## 2018-12-04 NOTE — ED Provider Notes (Addendum)
[facility]   CSN: [id number] Arrival date & time: 12/04/18  1542     History   Chief Complaint Chief Complaint  Patient presents with   Ankle Injury   Urinary Tract Infection    HPI Whitney Thornton is a 53 y.o. 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  Patient states that she was walking down her steps when she became a bit lightheaded and missed the last step resulting in a fall.  She did not have a syncopal episode.  She did not have any chest pain or shortness of breath prior to fall.  She states that she did not hit her head or have loss of consciousness.  She states that she injured the right lateral ankle. Relays the area is painful mostly with attempts to bear weight, but at rest it is not so bad, she is able to bearweight.  She denies any other areas of injury.  She states she has her baseline knee discomfort which is chronic.  She denies headache, neck pain, back pain, chest pain, or abdominal pain.  Denies numbness, tingling, or weakness.  She states she thinks she might have gotten lightheaded due to elevated blood pressure sugar as she has not had her Metformin or glipizide for the past 1 week.  She also took her last trimethoprim today, she takes these for chronic UTI prevention, she states that today she did have some dysuria and frequency that is similar to prior UTIs.  She is currently sexually active in a monogamous relationship and is not concerned for STDs.  She denies flank pain, vomiting, or fevers.Patient also mentions that she is had a sore throat for the past few days.  No alleviating or aggravating factors.  She is concerned for COVID-19.   No known exposures.  She does work for [employer].  Denies fever, chills, cough, or shortness of breath.  She needs a new PCP in the area to prescribe her medicines.      HPI  Past Medical History:  Diagnosis Date   Diabetes mellitus without complication (HCC)    Nephritis     There are no active problems to display for this patient.   Past Surgical History:  Procedure Laterality Date   ABDOMINAL HYSTERECTOMY     CESAREAN SECTION     CHOLECYSTECTOMY     KNEE SURGERY Bilateral    SINUS EXPLORATION     sinus surgery     OB History   No obstetric history on file.      Home Medications    Prior to Admission medications   Medication Sig Start Date End Date Taking? Authorizing Provider  aspirin EC 81 MG tablet Take 81 mg by mouth at bedtime.    [provider]  cephALEXin (KEFLEX) 500 MG capsule Take 1 capsule (500 mg total) by mouth 4 (four) times daily. 10/15/18   Gwyneth SproutPlunkett, Whitney, MD  diphenhydramine-acetaminophen (TYLENOL PM) 25-500 MG TABS tablet Take 1 tablet by mouth at bedtime as needed (sleep/pain).    [provider]  glipiZIDE (GLUCOTROL XL) 2.5 MG 24 hr tablet Take 2.5 mg by mouth 2 (two) times daily with a meal.    [provider]  metFORMIN (GLUCOPHAGE-XR) 500 MG  24 hr tablet Take 500 mg by mouth 2 (two) times daily with a meal.    [provider]  ondansetron (ZOFRAN) 4 MG tablet Take 1 tablet (4 mg total) by mouth every 6 (six) hours. 07/29/18   Blanchie Dessert, MD  simvastatin (ZOCOR) 40 MG tablet Take 40 mg by mouth at bedtime.    [provider]  trimethoprim (TRIMPEX) 100 MG tablet Take 100 mg by mouth at bedtime.    [provider]    Family History No family history on file.  Social History Social History   Tobacco Use   Smoking status: Never Smoker   Smokeless tobacco: Never Used  Substance Use Topics   Alcohol use: Not Currently   Drug use: Not Currently     Allergies     Celebrex [celecoxib]   Review of Systems Review of Systems  Constitutional: Negative for chills and fever.  HENT: Positive for congestion and sore throat. Negative for ear pain.   Respiratory: Negative for cough and shortness of breath.   Cardiovascular: Negative for chest pain.  Gastrointestinal: Negative for abdominal pain, constipation, diarrhea and vomiting.  Genitourinary: Positive for dysuria and frequency. Negative for pelvic pain, vaginal bleeding and vaginal discharge.  Musculoskeletal: Positive for arthralgias.  Neurological: Positive for light-headedness (Resolved at present.). Negative for syncope, weakness and numbness.  All other systems reviewed and are negative.    Physical Exam Updated Vital Signs BP (!) 145/79 (BP Location: Right Arm)    Pulse 68    Temp 98.6 F (37 C) (Oral)    Resp 16    SpO2 98%   Physical Exam Vitals signs and nursing note reviewed.  Constitutional:      General: She is not in acute distress.    Appearance: She is well-developed.  HENT:     Head: Normocephalic and atraumatic.     Right Ear: Ear canal normal. Tympanic membrane is not perforated, erythematous, retracted or bulging.     Left Ear: Ear canal normal. Tympanic membrane is not perforated, erythematous, retracted or bulging.     Ears:     Comments: No mastoid erythema/swelling/tenderness.     Nose:     Right Sinus: No maxillary sinus tenderness or frontal sinus tenderness.     Left Sinus: No maxillary sinus tenderness or frontal sinus tenderness.     Mouth/Throat:     Pharynx: Uvula midline. No oropharyngeal exudate or posterior oropharyngeal erythema.     Comments: Posterior oropharynx is symmetric appearing. Patient tolerating own secretions without difficulty. No trismus. No drooling. No hot potato voice. No swelling beneath the tongue, submandibular compartment is soft.  Eyes:     General:        Right eye: No discharge.        Left eye: No discharge.      Conjunctiva/sclera: Conjunctivae normal.     Pupils: Pupils are equal, round, and reactive to light.  Neck:     Musculoskeletal: Normal range of motion and neck supple. No edema or neck rigidity.  Cardiovascular:     Rate and Rhythm: Normal rate and regular rhythm.     Heart sounds: No murmur.     Comments: 2+ symmetric radial and DP pulses. Pulmonary:     Effort: Pulmonary effort is normal. No respiratory distress.     Breath sounds: Normal breath sounds. No wheezing, rhonchi or rales.  Abdominal:     General: There is no distension.     Palpations: Abdomen is soft.  Tenderness: There is no abdominal tenderness. There is no right CVA tenderness, left CVA tenderness, guarding or rebound.  Musculoskeletal:     Comments: Upper extremities: Intact active range of motion without point/focal bony tenderness Back: No midline tenderness Lower extremities: Intact active range of motion throughout.  Tenderness palpation to the right lateral ankle including the lateral ankle ligaments and malleolus.  Lower extremities are otherwise without significant tenderness.  No tenderness at the base the fifth for the fibular head.  Lymphadenopathy:     Cervical: No cervical adenopathy.  Skin:    General: Skin is warm and dry.     Findings: No rash.  Neurological:     Mental Status: She is alert.     Comments: Alert clear speech.  CN III through XII grossly intact.  Station grossly intact bilateral upper and lower extremities.  5 out of 5 symmetric grip strength.  5 out 5 strength with plantar & dorsiflexion bilaterally.  Psychiatric:        Behavior: Behavior normal.    ED Treatments / Results  Labs (all labs ordered are listed, but only abnormal results are displayed) Labs Reviewed  URINALYSIS, ROUTINE W REFLEX MICROSCOPIC - Abnormal; Notable for the following components:      Result Value   APPearance HAZY (*)    Glucose, UA >=500 (*)    Bacteria, UA RARE (*)    All other components within  normal limits  BASIC METABOLIC PANEL - Abnormal; Notable for the following components:   Glucose, Bld 328 (*)    All other components within normal limits  CBG MONITORING, ED - Abnormal; Notable for the following components:   Glucose-Capillary 307 (*)    All other components within normal limits  CBG MONITORING, ED - Abnormal; Notable for the following components:   Glucose-Capillary 225 (*)    All other components within normal limits  GROUP A STREP BY PCR  URINE CULTURE  SARS CORONAVIRUS 2 (TAT 6-24 HRS)  CBC    EKG EKG Interpretation  Date/Time:  Monday December 04 2018 17:49:39 EST Ventricular Rate:  69 PR Interval:    QRS Duration: 110 QT Interval:  433 QTC Calculation: 464 R Axis:   76 Text Interpretation: Sinus rhythm Baseline wander in lead(s) I II aVR No significant change since 10/15/2018 Confirmed by Geoffery Lyons ([id number]) on 12/04/2018 9:33:58 PM   Radiology Dg Ankle Complete Right  Result Date: 12/04/2018 CLINICAL DATA:  Right ankle pain secondary to a fall with swelling and bruising. EXAM: RIGHT ANKLE - COMPLETE 3+ VIEW COMPARISON:  None. FINDINGS: There is no evidence of fracture, dislocation, or joint effusion. There is no evidence of arthropathy or other focal bone abnormality. There is soft tissue swelling at the tip of the lateral malleolus. IMPRESSION: Soft tissue swelling. Otherwise, normal exam. Electronically Signed   By: Francene Boyers M.D.   On: 12/04/2018 17:18    Procedures Procedures (including critical care time)  Medications Ordered in ED Medications - No data to display  Initial Impression / Assessment and Plan / ED Course  I have reviewed the triage vital signs and the nursing notes.  Pertinent labs & imaging results that were available during my care of the patient were reviewed by me and considered in my medical decision making (see chart for details).   Patient presents to the emergency department status post fall related to some  lightheadedness and mis-stepping with complaints of right ankle pain, she is concerned her blood sugar is high as  she has ran out of her diabetes medicines, she also mentions a sore throat with concern for Covid as well as some dysuria/frequency that feels like her prior UTIs.    CBC: No leukocytosis or anemia BMP: Hyperglycemia without acidosis or anion gap elevation. Urinalysis: Glucosuria, no ketonuria, rare bacteria, not overly convincing for UTI. Strep test: Negative Covid test: Pending EKG: No significant change since last tracing Right ankle x-ray soft tissue swelling without fracture/dislocation.  Regarding lightheadedness-resolved at present, overall reassuring work-up.  No anemia, no electrolyte derangement, no concerning EKG changes.  Hyperglycemia improved status post fluids, labs not consistent with DKA.  Regarding dysuria/frequency-urinalysis not consistent with UTI, given her history will send urine culture, recommended/offered STD testing which she declined.  Her frequency may be somewhat related to her hyperglycemia.  Regarding sore throat-strep test negative.  Exam not consistent with RPA/PTA.  Covid test sent and pending, discussed quarantine and hand hygiene.  Regarding fall-no signs of serious head, neck, or back injury, do not feel that CT head/neck is necessary per [place] trauma rules.  No midline spinal tenderness or focal neurologic deficits to suggest head bleed or spinal fracture.  No chest or abdominal tenderness to indicate acute intrathoracic/abdominal injury.  Seems to have isolated injury to the right ankle, lateral malleolus/ligament tenderness to palpation, x-ray without fracture dislocation, neurovascularly intact distally, placed in ASO, able to bear weight.  Overall reassuring work-up in the emergency department.  Patient appears appropriate for discharge home.  Tylenol for pain related to ankle discomfort/sore throat.  Will provide 30-day refill of her  diabetes and UTI prevention medicine. I discussed results, treatment plan, need for follow-up, and return precautions with the patient. Provided opportunity for questions, patient confirmed understanding and is in agreement with plan.   Whitney Thornton was evaluated in Emergency Department on 12/04/2018 for the symptoms described in the history of present illness. He/she was evaluated in the context of the global COVID-19 pandemic, which necessitated consideration that the patient might be at risk for infection with the SARS-CoV-2 virus that causes COVID-19. Institutional protocols and algorithms that pertain to the evaluation of patients at risk for COVID-19 are in a state of rapid change based on information released by regulatory bodies including the CDC and federal and state organizations. These policies and algorithms were followed during the patient's care in the ED.  Final Clinical Impressions(s) / ED Diagnoses   Final diagnoses:  Fall, initial encounter  Hyperglycemia  Injury of right ankle, initial encounter  Lightheadedness  Sore throat    ED Discharge Orders         Ordered    acetaminophen (TYLENOL 8 HOUR) 650 MG CR tablet  Every 8 hours PRN     12/04/18 2142    trimethoprim (TRIMPEX) 100 MG tablet  Daily at bedtime     12/04/18 2142    metFORMIN (GLUCOPHAGE-XR) 500 MG 24 hr tablet  2 times daily with meals     12/04/18 2142    glipiZIDE (GLUCOTROL XL) 2.5 MG 24 hr tablet  2 times daily with meals     12/04/18 2142           Trinidy Masterson, [place] R, PA-C 12/04/18 2144    Cherly Anderson, PA-C 12/04/18 2144    Geoffery Lyons, MD 12/04/18 2323

## 2018-12-05 LAB — URINE CULTURE

## 2018-12-07 ENCOUNTER — Telehealth: Payer: Self-pay | Admitting: General Practice

## 2018-12-07 NOTE — Telephone Encounter (Signed)
Negative COVID results given. Patient results "NOT Detected." Caller expressed understanding. ° °

## 2019-01-24 ENCOUNTER — Emergency Department (HOSPITAL_COMMUNITY)
Admission: EM | Admit: 2019-01-24 | Discharge: 2019-01-25 | Disposition: A | Discharge status: Home / Self Care | Payer: Self-pay | Attending: Emergency Medicine | Admitting: Emergency Medicine

## 2019-01-24 ENCOUNTER — Other Ambulatory Visit: Payer: Self-pay

## 2019-01-24 DIAGNOSIS — Z7984 Long term (current) use of oral hypoglycemic drugs: Secondary | ICD-10-CM | POA: Insufficient documentation

## 2019-01-24 DIAGNOSIS — R0602 Shortness of breath: Secondary | ICD-10-CM | POA: Insufficient documentation

## 2019-01-24 DIAGNOSIS — Z7982 Long term (current) use of aspirin: Secondary | ICD-10-CM | POA: Insufficient documentation

## 2019-01-24 DIAGNOSIS — Z79899 Other long term (current) drug therapy: Secondary | ICD-10-CM | POA: Insufficient documentation

## 2019-01-24 DIAGNOSIS — R1031 Right lower quadrant pain: Secondary | ICD-10-CM | POA: Insufficient documentation

## 2019-01-24 DIAGNOSIS — E1165 Type 2 diabetes mellitus with hyperglycemia: Secondary | ICD-10-CM | POA: Insufficient documentation

## 2019-01-24 DIAGNOSIS — Z20822 Contact with and (suspected) exposure to covid-19: Secondary | ICD-10-CM | POA: Insufficient documentation

## 2019-01-24 DIAGNOSIS — R109 Unspecified abdominal pain: Secondary | ICD-10-CM

## 2019-01-24 DIAGNOSIS — R739 Hyperglycemia, unspecified: Secondary | ICD-10-CM

## 2019-01-24 LAB — BASIC METABOLIC PANEL
Anion gap: 12 (ref 5–15)
BUN: 13 mg/dL (ref 6–20)
CO2: 22 mmol/L (ref 22–32)
Calcium: 10.1 mg/dL (ref 8.9–10.3)
Chloride: 101 mmol/L (ref 98–111)
Creatinine, Ser: 0.66 mg/dL (ref 0.44–1.00)
GFR calc Af Amer: >60 mL/min (ref >60)
GFR calc non Af Amer: >60 mL/min (ref >60)
Glucose, Bld: 335 mg/dL — ABNORMAL HIGH (ref 70–99)
Potassium: 4.3 mmol/L (ref 3.5–5.1)
Sodium: 135 mmol/L (ref 135–145)

## 2019-01-24 LAB — CBC
HCT: 44.8 % (ref 36.0–46.0)
Hemoglobin: 15 g/dL (ref 12.0–15.0)
MCH: 29.6 pg (ref 26.0–34.0)
MCHC: 33.5 g/dL (ref 30.0–36.0)
MCV: 88.5 fL (ref 80.0–100.0)
Platelets: 264 10*3/uL (ref 150–400)
RBC: 5.06 MIL/uL (ref 3.87–5.11)
RDW: 13.5 % (ref 11.5–15.5)
WBC: 9.6 10*3/uL (ref 4.0–10.5)
nRBC: 0 % (ref 0.0–0.2)

## 2019-01-24 LAB — URINALYSIS, ROUTINE W REFLEX MICROSCOPIC
Bilirubin Urine: NEGATIVE
Glucose, UA: >=500 mg/dL — AB
Hgb urine dipstick: NEGATIVE
Ketones, ur: NEGATIVE mg/dL
Nitrite: NEGATIVE
Protein, ur: NEGATIVE mg/dL
Specific Gravity, Urine: 1.035 — ABNORMAL HIGH (ref 1.005–1.030)
pH: 5 (ref 5.0–8.0)

## 2019-01-24 LAB — I-STAT BETA HCG BLOOD, ED (MC, WL, AP ONLY): I-stat hCG, quantitative: <5 m[IU]/mL (ref <5)

## 2019-01-24 NOTE — ED Triage Notes (Signed)
Pt here for R sided flank pain and burning with urination x 2 days. Pt on maintenance antibiotics for kidney infections.

## 2019-01-25 ENCOUNTER — Emergency Department (HOSPITAL_COMMUNITY): Payer: Self-pay

## 2019-01-25 LAB — CBG MONITORING, ED: Glucose-Capillary: 275 mg/dL — ABNORMAL HIGH (ref 70–99)

## 2019-01-25 LAB — SARS CORONAVIRUS 2 (TAT 6-24 HRS): SARS Coronavirus 2: NEGATIVE

## 2019-01-25 LAB — POC SARS CORONAVIRUS 2 AG -  ED: SARS Coronavirus 2 Ag: NEGATIVE

## 2019-01-25 LAB — D-DIMER, QUANTITATIVE: D-Dimer, Quant: <0.27 ug/mL-FEU (ref 0.00–0.50)

## 2019-01-25 MED ORDER — METFORMIN HCL ER 500 MG PO TB24: 500.0000 mg | ORAL_TABLET | Freq: Two times a day (BID) | ORAL | 0 refills | Status: DC

## 2019-01-25 MED ORDER — KETOROLAC TROMETHAMINE 30 MG/ML IJ SOLN
60.0000 mg | Freq: Once | INTRAMUSCULAR | Status: AC
  Administered 2019-01-25: 60 mg via INTRAMUSCULAR
  Filled 2019-01-25: qty 2

## 2019-01-25 MED ORDER — FLUCONAZOLE 150 MG PO TABS
150.0000 mg | ORAL_TABLET | Freq: Once | ORAL | Status: AC
  Administered 2019-01-25: 150 mg via ORAL
  Filled 2019-01-25: qty 1

## 2019-01-25 MED ORDER — GLIPIZIDE ER 2.5 MG PO TB24: 2.5000 mg | ORAL_TABLET | Freq: Two times a day (BID) | ORAL | 0 refills | Status: DC

## 2019-01-25 MED ORDER — TRIMETHOPRIM 100 MG PO TABS: 100.0000 mg | ORAL_TABLET | Freq: Every day | ORAL | 0 refills | Status: DC

## 2019-01-25 NOTE — Discharge Instructions (Addendum)
You may alternate Tylenol 1000 mg every 6 hours as needed for pain, fever and Ibuprofen 800 mg every 8 hours as needed for pain, fever.  Please take Ibuprofen with food.  Do not take more than 4000 mg of Tylenol (acetaminophen) in a 24 hour period.  Please follow-up with your primary care physician if symptoms continue.  There is no sign of pneumonia or pulmonary embolus today.  Your rapid Covid test was negative.  We are sending out a more accurate Covid test that will take 24 hours to result.  You may follow-up on these results through MyChart.  You will be contacted if your test is positive.  Please continue to quarantine until you have a negative test result.  If your test result is positive, you will need to quarantine for 10 days after the onset of symptoms and be fever free for at least 3 full days without using Tylenol or ibuprofen before coming out of quarantine.  You may use over-the-counter Imodium as needed for diarrhea.  Your urine showed no abnormality other than a small amount of yeast which we have given you Diflucan for.  Your CT scan showed no sign of kidney stone or kidney infection today.  Please continue to take your Metformin and glipizide as prescribed.  Your blood glucose today was elevated.  Steps to find a Primary Care Provider (PCP):  Call [phone] or [phone] to access "[place] Find a Doctor Service."  2.  You may also go on the [facility] website at [email or web address]  3.  [place] [facility] also frequently accepts new patients.  [facility]  [address] [place] [address] [phone]  4.  There are also multiple [facility], [facility] and [facility] practices throughout the [place] that are frequently accepting new patients. You may find a clinic that is close to your home and contact them.  [facility] [email or web address] [phone]  [facility] [email or web address]  [email or web address] [phone]  [facility] [email or web address] [phone]  5.  Local Health Departments also can provide primary care services.  [facility]  [address] [place] [address] [phone]  [facility] Department [address] [place] [address] [phone]  [facility] [address] [place] [address]  [place] [address] [phone]

## 2019-01-25 NOTE — ED Provider Notes (Addendum)
TIME SEEN: 3:06 AM  CHIEF COMPLAINT: Bilateral flank pain worse on the right  HPI: Patient is a 54 year old female with history of diabetes, previous pyelonephritis who presents emergency department bilateral flank pain for the past day worse on the right side.  States this feels similar to when she has had pneumonia.  She denies any cough but has had shortness of breath.  No history of PE, DVT, exogenous estrogen use, recent fractures, surgery, trauma, hospitalization or prolonged travel. No lower extremity swelling or pain. No calf tenderness.  No fever that she is aware of.  No nausea or vomiting but has had diarrhea.  No loss of taste or smell.  No known Covid exposures but states she works for [employer].  States this does not feel like her previous kidney infections.  Takes trimethoprim 100 mg daily.  ROS: See HPI Constitutional: no fever  Eyes: no drainage  ENT: no runny nose   Cardiovascular:  no chest pain  Resp:  SOB  GI: no vomiting: + Diarrhea GU: no dysuria Integumentary: no rash  Allergy: no hives  Musculoskeletal: no leg swelling  Neurological: no slurred speech ROS otherwise negative  PAST MEDICAL HISTORY/PAST SURGICAL HISTORY:  Past Medical History:  Diagnosis Date  . Diabetes mellitus without complication ([place])   . Nephritis     MEDICATIONS:  Prior to Admission medications   Medication Sig Start Date End Date Taking? Authorizing Provider  acetaminophen (TYLENOL 8 HOUR) 650 MG CR tablet Take 1 tablet (650 mg total) by mouth every 8 (eight) hours as needed for pain. 12/04/18   Petrucelli, Glynda Jaeger, PA-C  aspirin EC 81 MG tablet Take 81 mg by mouth at bedtime.    [provider]  cephALEXin (KEFLEX) 500 MG capsule Take 1 capsule (500 mg total) by mouth 4 (four) times daily. 10/15/18   Blanchie Dessert, MD  diphenhydramine-acetaminophen (TYLENOL PM) 25-500 MG TABS tablet Take 1 tablet by mouth at bedtime as needed (sleep/pain).    [provider]  glipiZIDE (GLUCOTROL XL) 2.5 MG 24 hr tablet Take 1 tablet (2.5 mg total) by mouth 2 (two) times daily with a meal. 12/04/18   Petrucelli, Samantha R, PA-C  metFORMIN (GLUCOPHAGE-XR) 500 MG 24 hr tablet Take 1 tablet (500 mg total) by mouth 2 (two) times daily with a meal. 12/04/18   Petrucelli, Samantha R, PA-C  ondansetron (ZOFRAN) 4 MG tablet Take 1 tablet (4 mg total) by mouth every 6 (six) hours. 07/29/18   Blanchie Dessert, MD  simvastatin (ZOCOR) 40 MG tablet Take 40 mg by mouth at bedtime.    [provider]  trimethoprim (TRIMPEX) 100 MG tablet Take 1 tablet (100 mg total) by mouth at bedtime. 12/04/18   Petrucelli, Samantha R, PA-C    ALLERGIES:  Allergies  Allergen Reactions  . Celebrex [Celecoxib] Other (See Comments)    Patients reports weakness/ signs of stroke when she takes this.    SOCIAL HISTORY:  Social History   Tobacco Use  . Smoking status: Never Smoker  . Smokeless tobacco: Never Used  Substance Use Topics  . Alcohol use: Not Currently    FAMILY HISTORY: No family history on file.  EXAM: BP 95/78 (BP Location: Right Arm)   Pulse 62   Temp 98.4 F (36.9 C) (Oral)   Resp 16   SpO2 100%  CONSTITUTIONAL: Alert and oriented and responds appropriately to questions. Well-appearing; well-nourished HEAD: Normocephalic EYES: Conjunctivae clear, pupils appear equal, EOM appear intact ENT:  normal nose; moist mucous membranes NECK: Supple, normal ROM CARD: RRR; S1 and S2 appreciated; no murmurs, no clicks, no rubs, no gallops RESP: Normal chest excursion without splinting or tachypnea; breath sounds clear and equal bilaterally; no wheezes, no rhonchi, no rales, no hypoxia or respiratory distress, speaking full sentences ABD/GI: Normal bowel sounds; non-distended; soft, non-tender, no rebound, no guarding, no peritoneal signs, no hepatosplenomegaly BACK:  The back appears normal, no CVA tenderness, no midline spinal tenderness or step-off  or deformity, no redness or warmth, no ecchymosis or swelling EXT: Normal ROM in all joints; no deformity noted, no edema; no cyanosis, no calf tenderness or calf swelling SKIN: Normal color for age and race; warm; no rash on exposed skin NEURO: Moves all extremities equally PSYCH: The patient's mood and manner are appropriate.   MEDICAL DECISION MAKING: Patient here with bilateral flank pain worse on the right side that she states feels more concerning for pneumonia.  She has associated shortness of breath.  Also having diarrhea and states she would like COVID-19 testing today.  Will give Toradol for discomfort.  Labs, urine and CT of abdomen pelvis obtained in triage showed no acute abnormality.  Will obtain chest x-ray to evaluate for pneumonia as well as D-dimer to rule out PE.  She has no risk factors for pulmonary embolus other than age.  Patient agrees to this plan.  ED PROGRESS: D-dimer negative.  Chest x-ray clear.  Rapid Covid negative.  Will send outpatient 6 to 24-hour Covid test to confirm.  She will follow-up on the results through MyChart have recommended quarantine until she has a negative test result.  Her blood glucose was elevated initially but is coming down slightly.  She is asking that we refill her glipizide and Metformin.  She does have some yeast in her urine.  I do not think she has cystitis secondary to Candida. Will discharge with a dose of Diflucan.  Likely secondary to her hyperglycemia.  Recommended alternating Tylenol and Motrin for pain.  Recommended follow-up with her PCP if symptoms continued.  Of note, patient has had some slightly low blood pressures documented but has no dizziness and she is not tachycardic or significantly bradycardic.  Blood pressure is improved with repositioning.  She is also requested refill of her trimethoprim which she takes chronically.  At this time, I do not feel there is any life-threatening condition present. I have reviewed,  interpreted and discussed all results (EKG, imaging, lab, urine as appropriate) and exam findings with patient/family. I have reviewed nursing notes and appropriate previous records.  I feel the patient is safe to be discharged home without further emergent workup and can continue workup as an outpatient as needed. Discussed usual and customary return precautions. Patient/family verbalize understanding and are comfortable with this plan.  Outpatient follow-up has been provided as needed. All questions have been answered.    Whitney Thornton was evaluated in Emergency Department on 01/25/2019 for the symptoms described in the history of present illness. She was evaluated in the context of the global COVID-19 pandemic, which necessitated consideration that the patient might be at risk for infection with the SARS-CoV-2 virus that causes COVID-19. Institutional protocols and algorithms that pertain to the evaluation of patients at risk for COVID-19 are in a state of rapid change based on information released by regulatory bodies including the CDC and federal and state organizations. These policies and algorithms were followed during the patient's care in the ED.  Patient was seen wearing N95,  face shield, gloves.    Whitney Thornton, Layla Maw, DO 01/25/19 0553    Whitney Thornton, Layla Maw, DO 01/25/19 [phone]

## 2019-01-27 LAB — URINE CULTURE: Culture: >=100000 — AB

## 2019-01-29 ENCOUNTER — Telehealth: Payer: Self-pay | Admitting: Emergency Medicine

## 2019-01-29 NOTE — Telephone Encounter (Signed)
Post ED Visit - Positive Culture Follow-up: Successful Patient Follow-Up  Culture assessed and recommendations reviewed by:  []  , Pharm.D. []  Enzo Bi, .D., BCPS AQ-ID []  Celedonio Miyamoto, Pharm.D., BCPS []  [address], Pharm.D., BCPS []  [place], Garvin Fila.D., BCPS, AAHIVP []  , Pharm.D., BCPS, AAHIVP []  Georgina Pillion, PharmD, BCPS []  , PharmD, BCPS [x]  [place], PharmD, BCPS []  [address], PharmD  Positive urine culture  []  Patient discharged without antimicrobial prescription and treatment is now indicated [x]  Organism is resistant to prescribed ED discharge antimicrobial []  Patient with positive blood cultures  Changes discussed with ED provider: PA New antibiotic prescription symptom check, if + start Macrobid 100mg  po bid x 5 days [place] to [place]  Contacted patient   01/29/2019, 11:10 AM

## 2019-03-15 ENCOUNTER — Other Ambulatory Visit: Payer: Self-pay

## 2019-03-15 ENCOUNTER — Ambulatory Visit (INDEPENDENT_AMBULATORY_CARE_PROVIDER_SITE_OTHER): Payer: Managed Care, Other (non HMO) | Admitting: Primary Care

## 2019-03-15 VITALS — BP 131/80 | HR 75 | Temp 97.2°F | Ht 66.0 in | Wt 187.8 lb

## 2019-03-15 DIAGNOSIS — R300 Dysuria: Secondary | ICD-10-CM | POA: Diagnosis not present

## 2019-03-15 DIAGNOSIS — Z114 Encounter for screening for human immunodeficiency virus [HIV]: Secondary | ICD-10-CM | POA: Diagnosis not present

## 2019-03-15 DIAGNOSIS — E119 Type 2 diabetes mellitus without complications: Secondary | ICD-10-CM

## 2019-03-15 LAB — POCT URINALYSIS DIPSTICK
Bilirubin, UA: NEGATIVE
Blood, UA: NEGATIVE
Glucose, UA: POSITIVE — AB
Ketones, UA: NEGATIVE
Leukocytes, UA: NEGATIVE
Nitrite, UA: NEGATIVE
Protein, UA: NEGATIVE
Spec Grav, UA: 1.025 (ref 1.010–1.025)
Urobilinogen, UA: 0.2 E.U./dL
pH, UA: 5.5 (ref 5.0–8.0)

## 2019-03-15 LAB — POCT GLYCOSYLATED HEMOGLOBIN (HGB A1C): Hemoglobin A1C: 10.5 % — AB (ref 4.0–5.6)

## 2019-03-15 MED ORDER — CYCLOBENZAPRINE HCL 10 MG PO TABS: 10.0000 mg | ORAL_TABLET | Freq: Three times a day (TID) | ORAL | 3 refills | Status: DC | PRN

## 2019-03-15 MED ORDER — BASAGLAR KWIKPEN 100 UNIT/ML ~~LOC~~ SOPN: 15.0000 [IU] | PEN_INJECTOR | Freq: Every day | SUBCUTANEOUS | 1 refills | Status: DC

## 2019-03-15 MED ORDER — LISINOPRIL 2.5 MG PO TABS: 5.0000 mg | ORAL_TABLET | Freq: Every day | ORAL | 1 refills | Status: DC

## 2019-03-15 MED ORDER — METFORMIN HCL ER (MOD) 500 MG PO TB24: 1000.0000 mg | ORAL_TABLET | Freq: Two times a day (BID) | ORAL | 1 refills | Status: DC

## 2019-03-15 MED ORDER — GLIPIZIDE ER 10 MG PO TB24: 2.5000 mg | ORAL_TABLET | Freq: Two times a day (BID) | ORAL | 1 refills | Status: DC

## 2019-03-15 NOTE — Progress Notes (Signed)
New Patient Office Visit  Subjective:  Patient ID: Whitney Thornton, female    DOB: [birth year]  Age: 54 y.o. MRN: [id number]  CC: No chief complaint on file.   HPI Whitney Thornton presents for establishment of care. Has a history of uncontrolled type 2 diabetes and has several bouts of  UTI. She has previously lived in [place] and medical records for care remain there will have patient sign release for for records.  Past Medical History:  Diagnosis Date  . Diabetes mellitus without complication ([place])   . Nephritis     Past Surgical History:  Procedure Laterality Date  . ABDOMINAL HYSTERECTOMY    . CESAREAN SECTION    . CHOLECYSTECTOMY    . KNEE SURGERY Bilateral   . SINUS EXPLORATION     sinus surgery    No family history on file.  Social History   Socioeconomic History  . Marital status: Married    Spouse name: Not on file  . Number of children: Not on file  . Years of education: Not on file  . Highest education level: Not on file  Occupational History  . Not on file  Tobacco Use  . Smoking status: Never Smoker  . Smokeless tobacco: Never Used  Substance and Sexual Activity  . Alcohol use: Not Currently  . Drug use: Not Currently  . Sexual activity: Not on file  Other Topics Concern  . Not on file  Social History Narrative  . Not on file   Social Determinants of Health   Financial Resource Strain:   . Difficulty of Paying Living Expenses: Not on file  Food Insecurity:   . Worried About Charity fundraiser in the Last Year: Not on file  . Ran Out of Food in the Last Year: Not on file  Transportation Needs:   . Lack of Transportation (Medical): Not on file  . Lack of Transportation (Non-Medical): Not on file  Physical Activity:   . Days of Exercise per Week: Not on file  . Minutes of Exercise per Session: Not on file  Stress:   . Feeling of Stress : Not on file  Social Connections:   . Frequency of Communication with Friends and  Family: Not on file  . Frequency of Social Gatherings with Friends and Family: Not on file  . Attends Religious Services: Not on file  . Active Member of Clubs or Organizations: Not on file  . Attends Archivist Meetings: Not on file  . Marital Status: Not on file  Intimate Partner Violence:   . Fear of Current or Ex-Partner: Not on file  . Emotionally Abused: Not on file  . Physically Abused: Not on file  . Sexually Abused: Not on file    ROS Review of Systems  Endocrine: Positive for polydipsia, polyphagia and polyuria.  Genitourinary: Positive for dysuria, flank pain and frequency.  All other systems reviewed and are negative.   Objective:   Today's Vitals: BP 131/80 (BP Location: Left Arm, Patient Position: Sitting, Cuff Size: Large)   Pulse 75   Temp (!) 97.2 F (36.2 C) (Temporal)   Ht '5\' 6"'$  (1.676 m)   Wt 187 lb 12.8 oz (85.2 kg)   SpO2 92%   BMI 30.31 kg/m   Physical Exam Vitals reviewed.  Constitutional:      Appearance: She is obese.     Comments: morbid  HENT:     Head: Normocephalic.     Right Ear: Tympanic membrane  normal.     Left Ear: Tympanic membrane normal.     Nose: Nose normal.  Eyes:     Extraocular Movements: Extraocular movements intact.     Pupils: Pupils are equal, round, and reactive to light.  Cardiovascular:     Rate and Rhythm: Normal rate and regular rhythm.     Heart sounds: Normal heart sounds.  Pulmonary:     Effort: Pulmonary effort is normal.     Breath sounds: Normal breath sounds.  Abdominal:     General: Bowel sounds are normal.  Musculoskeletal:        General: Normal range of motion.  Skin:    General: Skin is warm and dry.  Neurological:     Mental Status: She is alert and oriented to person, place, and time.  Psychiatric:        Mood and Affect: Mood normal.        Behavior: Behavior normal.        Thought Content: Thought content normal.        Judgment: Judgment normal.     Assessment & Plan:   Diagnoses and all orders for this visit:  Type 2 diabetes mellitus without complication, without long-term current use of insulin (HCC) Uncontrolled with A1C 10.5 . Discussed increased risk for diabetic retinopathy, loss of sensation , risk for ulcer , prolong healing from sores, risk of renal failure  -     HgB A1c 10.5  -     Microalbumin/Creatinine Ratio, Urine -     glipiZIDE (GLUCOTROL XL) 10 MG 24 hr tablet; Take 1 tablet (10 mg total) by mouth 2 (two) times daily after a meal. -     metFORMIN (GLUMETZA) 500 MG (MOD) 24 hr tablet; Take 2 tablets (1,000 mg total) by mouth 2 (two) times daily with a meal. -     CBC with Differential -     Lipid Panel -     CMP14+EGFR -     lisinopril (ZESTRIL) 2.5 MG tablet; Take 2 tablets (5 mg total) by mouth daily. -     Insulin Glargine (BASAGLAR KWIKPEN) 100 UNIT/ML SOPN; Inject 0.15 mLs (15 Units total) into the skin at bedtime.  Encounter for screening for HIV Health maintenance and quality metrics -     HIV antibody (with reflex)  Dysuria Patient is adamant about being culture  Although U/A negative UTI in the past  -     Urine Culture -     Urinalysis Dipstick  Other orders -     cyclobenzaprine (FLEXERIL) 10 MG tablet; Take 1 tablet (10 mg total) by mouth 3 (three) times daily as needed for muscle spasms.     Outpatient Encounter Medications as of 03/15/2019  Medication Sig  . aspirin EC 81 MG tablet Take 81 mg by mouth at bedtime.  Marland Kitchen glipiZIDE (GLUCOTROL XL) 10 MG 24 hr tablet Take 1 tablet (10 mg total) by mouth 2 (two) times daily after a meal.  . metFORMIN (GLUMETZA) 500 MG (MOD) 24 hr tablet Take 2 tablets (1,000 mg total) by mouth 2 (two) times daily with a meal.  . [DISCONTINUED] glipiZIDE (GLUCOTROL XL) 2.5 MG 24 hr tablet Take 1 tablet (2.5 mg total) by mouth 2 (two) times daily with a meal.  . [DISCONTINUED] metFORMIN (GLUCOPHAGE-XR) 500 MG 24 hr tablet Take 1 tablet (500 mg total) by mouth 2 (two) times daily with a meal.   . acetaminophen (TYLENOL 8 HOUR) 650 MG CR tablet Take 1  tablet (650 mg total) by mouth every 8 (eight) hours as needed for pain.  . cyclobenzaprine (FLEXERIL) 10 MG tablet Take 1 tablet (10 mg total) by mouth 3 (three) times daily as needed for muscle spasms.  . diphenhydramine-acetaminophen (TYLENOL PM) 25-500 MG TABS tablet Take 1 tablet by mouth at bedtime as needed (sleep/pain).  . Insulin Glargine (BASAGLAR KWIKPEN) 100 UNIT/ML SOPN Inject 0.15 mLs (15 Units total) into the skin at bedtime.  Marland Kitchen lisinopril (ZESTRIL) 2.5 MG tablet Take 2 tablets (5 mg total) by mouth daily.  . [DISCONTINUED] simvastatin (ZOCOR) 40 MG tablet Take 40 mg by mouth at bedtime.  . [DISCONTINUED] trimethoprim (TRIMPEX) 100 MG tablet Take 1 tablet (100 mg total) by mouth at bedtime.   No facility-administered encounter medications on file as of 03/15/2019.    Follow-up: Return in about 3 months (around 06/12/2019) for DM/ HTN (schedule for pap/mam).   Kerin Perna, NP

## 2019-03-15 NOTE — Patient Instructions (Signed)

## 2019-03-16 ENCOUNTER — Other Ambulatory Visit (INDEPENDENT_AMBULATORY_CARE_PROVIDER_SITE_OTHER): Payer: Self-pay | Admitting: Primary Care

## 2019-03-16 LAB — CMP14+EGFR
ALT: 38 IU/L — ABNORMAL HIGH (ref 0–32)
AST: 18 IU/L (ref 0–40)
Albumin/Globulin Ratio: 2.2 (ref 1.2–2.2)
Albumin: 4.4 g/dL (ref 3.8–4.9)
Alkaline Phosphatase: 111 IU/L (ref 39–117)
BUN/Creatinine Ratio: 16 (ref 9–23)
BUN: 11 mg/dL (ref 6–24)
Bilirubin Total: 0.4 mg/dL (ref 0.0–1.2)
CO2: 20 mmol/L (ref 20–29)
Calcium: 9.4 mg/dL (ref 8.7–10.2)
Chloride: 100 mmol/L (ref 96–106)
Creatinine, Ser: 0.68 mg/dL (ref 0.57–1.00)
GFR calc Af Amer: 116 mL/min/{1.73_m2} (ref >59)
GFR calc non Af Amer: 101 mL/min/{1.73_m2} (ref >59)
Globulin, Total: 2 g/dL (ref 1.5–4.5)
Glucose: 258 mg/dL — ABNORMAL HIGH (ref 65–99)
Potassium: 4.1 mmol/L (ref 3.5–5.2)
Sodium: 136 mmol/L (ref 134–144)
Total Protein: 6.4 g/dL (ref 6.0–8.5)

## 2019-03-16 LAB — LIPID PANEL
Chol/HDL Ratio: 6.4 ratio — ABNORMAL HIGH (ref 0.0–4.4)
Cholesterol, Total: 238 mg/dL — ABNORMAL HIGH (ref 100–199)
HDL: 37 mg/dL — ABNORMAL LOW (ref >39)
LDL Chol Calc (NIH): 110 mg/dL — ABNORMAL HIGH (ref 0–99)
Triglycerides: 525 mg/dL — ABNORMAL HIGH (ref 0–149)
VLDL Cholesterol Cal: 91 mg/dL — ABNORMAL HIGH (ref 5–40)

## 2019-03-16 LAB — CBC WITH DIFFERENTIAL/PLATELET
Basophils Absolute: 0.1 10*3/uL (ref 0.0–0.2)
Basos: 1 %
EOS (ABSOLUTE): 0.3 10*3/uL (ref 0.0–0.4)
Eos: 4 %
Hematocrit: 44.7 % (ref 34.0–46.6)
Hemoglobin: 15.3 g/dL (ref 11.1–15.9)
Immature Grans (Abs): 0.1 10*3/uL (ref 0.0–0.1)
Immature Granulocytes: 1 %
Lymphocytes Absolute: 1.9 10*3/uL (ref 0.7–3.1)
Lymphs: 23 %
MCH: 30.2 pg (ref 26.6–33.0)
MCHC: 34.2 g/dL (ref 31.5–35.7)
MCV: 88 fL (ref 79–97)
Monocytes Absolute: 0.6 10*3/uL (ref 0.1–0.9)
Monocytes: 7 %
Neutrophils Absolute: 5.6 10*3/uL (ref 1.4–7.0)
Neutrophils: 64 %
Platelets: 250 10*3/uL (ref 150–450)
RBC: 5.06 x10E6/uL (ref 3.77–5.28)
RDW: 12.6 % (ref 11.7–15.4)
WBC: 8.5 10*3/uL (ref 3.4–10.8)

## 2019-03-16 LAB — MICROALBUMIN / CREATININE URINE RATIO
Creatinine, Urine: 134.8 mg/dL
Microalb/Creat Ratio: 4 mg/g creat (ref 0–29)
Microalbumin, Urine: 6 ug/mL

## 2019-03-16 LAB — HIV ANTIBODY (ROUTINE TESTING W REFLEX): HIV Screen 4th Generation wRfx: NONREACTIVE

## 2019-03-16 MED ORDER — EZETIMIBE 10 MG PO TABS: 10.0000 mg | ORAL_TABLET | Freq: Every day | ORAL | 1 refills | Status: DC

## 2019-03-16 MED ORDER — SIMVASTATIN 40 MG PO TABS: 40.0000 mg | ORAL_TABLET | Freq: Every day | ORAL | 1 refills | Status: DC

## 2019-03-17 LAB — URINE CULTURE

## 2019-03-19 ENCOUNTER — Encounter (INDEPENDENT_AMBULATORY_CARE_PROVIDER_SITE_OTHER): Payer: Self-pay | Admitting: Primary Care

## 2019-03-19 DIAGNOSIS — E119 Type 2 diabetes mellitus without complications: Secondary | ICD-10-CM

## 2019-03-20 MED ORDER — METFORMIN HCL ER 500 MG PO TB24: 1000.0000 mg | ORAL_TABLET | Freq: Two times a day (BID) | ORAL | 1 refills | Status: DC

## 2019-03-23 ENCOUNTER — Encounter (INDEPENDENT_AMBULATORY_CARE_PROVIDER_SITE_OTHER): Payer: Self-pay | Admitting: Primary Care

## 2019-03-26 ENCOUNTER — Encounter (INDEPENDENT_AMBULATORY_CARE_PROVIDER_SITE_OTHER): Payer: Self-pay | Admitting: Primary Care

## 2019-03-26 ENCOUNTER — Other Ambulatory Visit: Payer: Self-pay

## 2019-03-26 ENCOUNTER — Ambulatory Visit (INDEPENDENT_AMBULATORY_CARE_PROVIDER_SITE_OTHER): Payer: Managed Care, Other (non HMO) | Admitting: Primary Care

## 2019-03-26 ENCOUNTER — Other Ambulatory Visit (HOSPITAL_COMMUNITY)
Admission: RE | Admit: 2019-03-26 | Discharge: 2019-03-26 | Disposition: A | Discharge status: Home / Self Care | Payer: Managed Care, Other (non HMO) | Source: Ambulatory Visit | Attending: Primary Care | Admitting: Primary Care

## 2019-03-26 VITALS — BP 111/70 | HR 81 | Temp 97.3°F | Ht 66.0 in | Wt 191.0 lb

## 2019-03-26 DIAGNOSIS — E119 Type 2 diabetes mellitus without complications: Secondary | ICD-10-CM

## 2019-03-26 DIAGNOSIS — M25562 Pain in left knee: Secondary | ICD-10-CM

## 2019-03-26 DIAGNOSIS — B9689 Other specified bacterial agents as the cause of diseases classified elsewhere: Secondary | ICD-10-CM | POA: Insufficient documentation

## 2019-03-26 DIAGNOSIS — G8929 Other chronic pain: Secondary | ICD-10-CM

## 2019-03-26 DIAGNOSIS — Z124 Encounter for screening for malignant neoplasm of cervix: Secondary | ICD-10-CM | POA: Insufficient documentation

## 2019-03-26 DIAGNOSIS — Z113 Encounter for screening for infections with a predominantly sexual mode of transmission: Secondary | ICD-10-CM | POA: Insufficient documentation

## 2019-03-26 DIAGNOSIS — N760 Acute vaginitis: Secondary | ICD-10-CM | POA: Diagnosis not present

## 2019-03-26 DIAGNOSIS — Z1231 Encounter for screening mammogram for malignant neoplasm of breast: Secondary | ICD-10-CM

## 2019-03-26 DIAGNOSIS — E66812 Obesity, class 2: Secondary | ICD-10-CM

## 2019-03-26 DIAGNOSIS — M25561 Pain in right knee: Secondary | ICD-10-CM

## 2019-03-26 DIAGNOSIS — Z1211 Encounter for screening for malignant neoplasm of colon: Secondary | ICD-10-CM

## 2019-03-26 LAB — GLUCOSE, POCT (MANUAL RESULT ENTRY): POC Glucose: 145 mg/dl — AB (ref 70–99)

## 2019-03-26 NOTE — Patient Instructions (Signed)
Health Maintenance, Female Adopting a healthy lifestyle and getting preventive care are important in promoting health and wellness. Ask your health care provider about:  The right schedule for you to have regular tests and exams.  Things you can do on your own to prevent diseases and keep yourself healthy. What should I know about diet, weight, and exercise? Eat a healthy diet   Eat a diet that includes plenty of vegetables, fruits, low-fat dairy products, and lean protein.  Do not eat a lot of foods that are high in solid fats, added sugars, or sodium. Maintain a healthy weight Body mass index (BMI) is used to identify weight problems. It estimates body fat based on height and weight. Your health care provider can help determine your BMI and help you achieve or maintain a healthy weight. Get regular exercise Get regular exercise. This is one of the most important things you can do for your health. Most adults should:  Exercise for at least 150 minutes each week. The exercise should increase your heart rate and make you sweat (moderate-intensity exercise).  Do strengthening exercises at least twice a week. This is in addition to the moderate-intensity exercise.  Spend less time sitting. Even light physical activity can be beneficial. Watch cholesterol and blood lipids Have your blood tested for lipids and cholesterol at 53 years of age, then have this test every 5 years. Have your cholesterol levels checked more often if:  Your lipid or cholesterol levels are high.  You are older than 53 years of age.  You are at high risk for heart disease. What should I know about cancer screening? Depending on your health history and family history, you may need to have cancer screening at various ages. This may include screening for:  Breast cancer.  Cervical cancer.  Colorectal cancer.  Skin cancer.  Lung cancer. What should I know about heart disease, diabetes, and high blood  pressure? Blood pressure and heart disease  High blood pressure causes heart disease and increases the risk of stroke. This is more likely to develop in people who have high blood pressure readings, are of African descent, or are overweight.  Have your blood pressure checked: ? Every 3-5 years if you are 18-39 years of age. ? Every year if you are 40 years old or older. Diabetes Have regular diabetes screenings. This checks your fasting blood sugar level. Have the screening done:  Once every three years after age 40 if you are at a normal weight and have a low risk for diabetes.  More often and at a younger age if you are overweight or have a high risk for diabetes. What should I know about preventing infection? Hepatitis B If you have a higher risk for hepatitis B, you should be screened for this virus. Talk with your health care provider to find out if you are at risk for hepatitis B infection. Hepatitis C Testing is recommended for:  Everyone born from 1945 through 1965.  Anyone with known risk factors for hepatitis C. Sexually transmitted infections (STIs)  Get screened for STIs, including gonorrhea and chlamydia, if: ? You are sexually active and are younger than 53 years of age. ? You are older than 53 years of age and your health care provider tells you that you are at risk for this type of infection. ? Your sexual activity has changed since you were last screened, and you are at increased risk for chlamydia or gonorrhea. Ask your health care provider if   you are at risk.  Ask your health care provider about whether you are at high risk for HIV. Your health care provider may recommend a prescription medicine to help prevent HIV infection. If you choose to take medicine to prevent HIV, you should first get tested for HIV. You should then be tested every 3 months for as long as you are taking the medicine. Pregnancy  If you are about to stop having your period (premenopausal) and  you may become pregnant, seek counseling before you get pregnant.  Take 400 to 800 micrograms (mcg) of folic acid every day if you become pregnant.  Ask for birth control (contraception) if you want to prevent pregnancy. Osteoporosis and menopause Osteoporosis is a disease in which the bones lose minerals and strength with aging. This can result in bone fractures. If you are 65 years old or older, or if you are at risk for osteoporosis and fractures, ask your health care provider if you should:  Be screened for bone loss.  Take a calcium or vitamin D supplement to lower your risk of fractures.  Be given hormone replacement therapy (HRT) to treat symptoms of menopause. Follow these instructions at home: Lifestyle  Do not use any products that contain nicotine or tobacco, such as cigarettes, e-cigarettes, and chewing tobacco. If you need help quitting, ask your health care provider.  Do not use street drugs.  Do not share needles.  Ask your health care provider for help if you need support or information about quitting drugs. Alcohol use  Do not drink alcohol if: ? Your health care provider tells you not to drink. ? You are pregnant, may be pregnant, or are planning to become pregnant.  If you drink alcohol: ? Limit how much you use to 0-1 drink a day. ? Limit intake if you are breastfeeding.  Be aware of how much alcohol is in your drink. In the [place] one drink equals one 12 oz bottle of beer (355 mL), one 5 oz glass of wine (148 mL), or one 1 oz glass of hard liquor (44 mL). General instructions  Schedule regular health, dental, and eye exams.  Stay current with your vaccines.  Tell your health care provider if: ? You often feel depressed. ? You have ever been abused or do not feel safe at home. Summary  Adopting a healthy lifestyle and getting preventive care are important in promoting health and wellness.  Follow your health care provider's instructions about healthy  diet, exercising, and getting tested or screened for diseases.  Follow your health care provider's instructions on monitoring your cholesterol and blood pressure. This information is not intended to replace advice given to you by your health care provider. Make sure you discuss any questions you have with your health care provider. Document Revised: 12/28/2017 Document Reviewed: 12/28/2017 Elsevier Patient Education  2020 [employer] Inc[employer]  

## 2019-03-26 NOTE — Progress Notes (Signed)
New Patient Office Visit  Subjective:  Patient ID: Whitney Thornton, female    DOB: 08/07/1965  Age: 54 y.o. MRN: [id number]  CC:  Chief Complaint  Patient presents with  . Gynecologic Exam    HPI [place] presents for well woman exam- gynecologic. She had an complete hysterectomy 20 years ago unknown if she has had 3 normal paps since than  Past Medical History:  Diagnosis Date  . Diabetes mellitus without complication ([place])   . Nephritis     Past Surgical History:  Procedure Laterality Date  . ABDOMINAL HYSTERECTOMY    . CESAREAN SECTION    . CHOLECYSTECTOMY    . KNEE SURGERY Bilateral   . SINUS EXPLORATION     sinus surgery    History reviewed. No pertinent family history.  Social History   Socioeconomic History  . Marital status: Married    Spouse name: Not on file  . Number of children: Not on file  . Years of education: Not on file  . Highest education level: Not on file  Occupational History  . Not on file  Tobacco Use  . Smoking status: Never Smoker  . Smokeless tobacco: Never Used  Substance and Sexual Activity  . Alcohol use: Not Currently  . Drug use: Not Currently  . Sexual activity: Not on file  Other Topics Concern  . Not on file  Social History Narrative  . Not on file   Social Determinants of Health   Financial Resource Strain:   . Difficulty of Paying Living Expenses: Not on file  Food Insecurity:   . Worried About Charity fundraiser in the Last Year: Not on file  . Ran Out of Food in the Last Year: Not on file  Transportation Needs:   . Lack of Transportation (Medical): Not on file  . Lack of Transportation (Non-Medical): Not on file  Physical Activity:   . Days of Exercise per Week: Not on file  . Minutes of Exercise per Session: Not on file  Stress:   . Feeling of Stress : Not on file  Social Connections:   . Frequency of Communication with Friends and Family: Not on file  . Frequency of Social  Gatherings with Friends and Family: Not on file  . Attends Religious Services: Not on file  . Active Member of Clubs or Organizations: Not on file  . Attends Archivist Meetings: Not on file  . Marital Status: Not on file  Intimate Partner Violence:   . Fear of Current or Ex-Partner: Not on file  . Emotionally Abused: Not on file  . Physically Abused: Not on file  . Sexually Abused: Not on file    ROS Review of Systems  All other systems reviewed and are negative.   Objective:   Today's Vitals: BP 111/70 (BP Location: Right Arm, Patient Position: Sitting, Cuff Size: Normal)   Pulse 81   Temp (!) 97.3 F (36.3 C) (Temporal)   Ht 5\' 6"  (1.676 m)   Wt 191 lb (86.6 kg)   SpO2 94%   BMI 30.83 kg/m   Physical Exam CONSTITUTIONAL: Well-developed, well-nourished female in no acute distress.  HENT:  Normocephalic, atraumatic, External right and left ear normal.  EYES: Conjunctivae and EOM are normal. Pupils are equal, round, and reactive to light. No scleral icterus.  NECK: Normal range of motion, supple, no masses.  Normal thyroid.  SKIN: Skin is warm and dry. No rash noted. Not diaphoretic. No erythema.  No pallor. NEUROLGIC: Alert and oriented to person, place, and time. Normal reflexes, muscle tone coordination. No cranial nerve deficit noted. PSYCHIATRIC: Normal mood and affect. Normal behavior. Normal judgment and thought content. CARDIOVASCULAR: Normal heart rate noted, regular rhythm RESPIRATORY: Clear to auscultation bilaterally. Effort and breath sounds normal, no problems with respiration noted. BREASTS:Patient demonstrated SBE done perfect starting with the tail of Spence . ABDOMEN: Soft, normal bowel sounds, no distention noted.  No tenderness, rebound or guarding.  PELVIC: Normal appearing external genitalia; normal appearing vaginal mucosa no cervix visualized.  No abnormal discharge noted.  Pap smear obtained.  Normal uterine size, no other palpable masses, no  uterine or adnexal tenderness. MUSCULOSKELETAL: Normal range of motion. No tenderness.  No cyanosis, clubbing, or edema.  Assessment & Plan:  [place] was seen today for gynecologic exam.  Diagnoses and all orders for this visit:  Type 2 diabetes mellitus without complication, without long-term current use of insulin (HCC) Goal of therapy: Less than 6.5 hemoglobin A1c.Decrease foods that are high in carbohydrates are the following rice, potatoes, breads, sugars, and pastas.  Reduction in the intake (eating) will assist in lowering your blood sugars. Patient is consciously watching what she eats we agreed on Pasta she is [place] and may eat this in moderation.  -     Glucose (CBG)  Cervical cancer screening The USPSTF recommendations screening of cervical cancer every 3 years with cervical cytology.  -     Cytology - PAP([place])  Encounter for screening examination for sexually transmitted disease -     Cervicovaginal ancillary only  Encounter for screening mammogram for malignant neoplasm of breast Recommended yearly screening and SBE monthly -     MM Digital Screening; Future  Colon cancer screening Normal colon cancer screening.  CDC recommends colorectal screening from ages 77-75.(USPSTF) -     Ambulatory referral to Gastroenterology  Chronic pain of both knees Work on losing weight to help reduce joint pain. May alternate with heat and ice application for pain relief. May also alternate with acetaminophen and Ibuprofen as prescribed pain relief. Other alternatives include massage, acupuncture and water aerobics.  You must stay active and avoid a sedentary lifestyle.  Class 2 severe obesity due to excess calories with serious comorbidity in adult, unspecified BMI (HCC) Whitney Thornton was seen today for gynecologic exam.   Chronic pain of both knees Work on losing weight to help reduce joint pain. May alternate with heat and ice application for pain relief. May also alternate with  acetaminophen and Ibuprofen as prescribed pain relief. Other alternatives include massage, acupuncture and water aerobics.  You must stay active and avoid a sedentary lifestyle. -     Ambulatory referral to Orthopedic Surgery  Class 2 severe obesity due to excess calories with serious comorbidity in adult, unspecified BMI (HCC) Obesity is 30-39 indicating an excess in caloric intake or underlining conditions. This may lead to other co-morbidities. Lifestyle modifications of diet and exercise may reduce obesity.    Outpatient Encounter Medications as of 03/26/2019  Medication Sig  . aspirin EC 81 MG tablet Take 81 mg by mouth at bedtime.  . cyclobenzaprine (FLEXERIL) 10 MG tablet Take 1 tablet (10 mg total) by mouth 3 (three) times daily as needed for muscle spasms.  . diphenhydramine-acetaminophen (TYLENOL PM) 25-500 MG TABS tablet Take 1 tablet by mouth at bedtime as needed (sleep/pain).  Marland Kitchen ezetimibe (ZETIA) 10 MG tablet Take 1 tablet (10 mg total) by mouth daily.  Marland Kitchen glipiZIDE (GLUCOTROL XL)  10 MG 24 hr tablet Take 1 tablet (10 mg total) by mouth 2 (two) times daily after a meal.  . Insulin Glargine (BASAGLAR KWIKPEN) 100 UNIT/ML SOPN Inject 0.15 mLs (15 Units total) into the skin at bedtime.  Marland Kitchen lisinopril (ZESTRIL) 2.5 MG tablet Take 2 tablets (5 mg total) by mouth daily.  . metFORMIN (GLUCOPHAGE-XR) 500 MG 24 hr tablet Take 2 tablets (1,000 mg total) by mouth 2 (two) times daily with a meal.  . simvastatin (ZOCOR) 40 MG tablet Take 1 tablet (40 mg total) by mouth at bedtime.  . [DISCONTINUED] acetaminophen (TYLENOL 8 HOUR) 650 MG CR tablet Take 1 tablet (650 mg total) by mouth every 8 (eight) hours as needed for pain.   No facility-administered encounter medications on file as of 03/26/2019.    Follow-up: Return if symptoms worsen or fail to improve.   Grayce Sessions, NP

## 2019-03-26 NOTE — Progress Notes (Signed)
Pt needs referral for ortho due to chronic knee pain She also complains of back pain  Would like referral to nutrition

## 2019-03-29 LAB — CERVICOVAGINAL ANCILLARY ONLY
Bacterial Vaginitis (gardnerella): POSITIVE — AB
Candida Glabrata: POSITIVE — AB
Candida Vaginitis: NEGATIVE
Chlamydia: NEGATIVE
Comment: NEGATIVE
Comment: NEGATIVE
Comment: NEGATIVE
Comment: NEGATIVE
Comment: NEGATIVE
Comment: NORMAL
Neisseria Gonorrhea: NEGATIVE
Trichomonas: NEGATIVE

## 2019-03-29 LAB — CYTOLOGY - PAP: Diagnosis: NEGATIVE

## 2019-04-02 ENCOUNTER — Other Ambulatory Visit (INDEPENDENT_AMBULATORY_CARE_PROVIDER_SITE_OTHER): Payer: Self-pay | Admitting: Primary Care

## 2019-04-02 ENCOUNTER — Encounter (INDEPENDENT_AMBULATORY_CARE_PROVIDER_SITE_OTHER): Payer: Self-pay | Admitting: Primary Care

## 2019-04-02 MED ORDER — METRONIDAZOLE 500 MG PO TABS: 500.0000 mg | ORAL_TABLET | Freq: Two times a day (BID) | ORAL | 0 refills | Status: DC

## 2019-04-02 MED ORDER — FLUCONAZOLE 150 MG PO TABS: 150.0000 mg | ORAL_TABLET | Freq: Once | ORAL | 0 refills | Status: AC

## 2019-04-04 ENCOUNTER — Ambulatory Visit: Payer: Managed Care, Other (non HMO) | Admitting: Orthopedic Surgery

## 2019-04-06 ENCOUNTER — Other Ambulatory Visit: Payer: Self-pay | Admitting: Primary Care

## 2019-04-06 DIAGNOSIS — Z1231 Encounter for screening mammogram for malignant neoplasm of breast: Secondary | ICD-10-CM

## 2019-04-09 ENCOUNTER — Encounter (INDEPENDENT_AMBULATORY_CARE_PROVIDER_SITE_OTHER): Payer: Self-pay | Admitting: Primary Care

## 2019-04-12 ENCOUNTER — Encounter: Payer: Self-pay | Admitting: Orthopedic Surgery

## 2019-04-12 ENCOUNTER — Telehealth: Payer: Self-pay

## 2019-04-12 ENCOUNTER — Ambulatory Visit
Admission: RE | Admit: 2019-04-12 | Discharge: 2019-04-12 | Disposition: A | Discharge status: Home / Self Care | Payer: Managed Care, Other (non HMO) | Source: Ambulatory Visit | Attending: Primary Care | Admitting: Primary Care

## 2019-04-12 ENCOUNTER — Ambulatory Visit: Payer: Self-pay

## 2019-04-12 ENCOUNTER — Other Ambulatory Visit: Payer: Self-pay

## 2019-04-12 ENCOUNTER — Ambulatory Visit (INDEPENDENT_AMBULATORY_CARE_PROVIDER_SITE_OTHER): Payer: Managed Care, Other (non HMO)

## 2019-04-12 ENCOUNTER — Ambulatory Visit (INDEPENDENT_AMBULATORY_CARE_PROVIDER_SITE_OTHER): Payer: Managed Care, Other (non HMO) | Admitting: Orthopedic Surgery

## 2019-04-12 VITALS — Ht 66.0 in | Wt 190.0 lb

## 2019-04-12 DIAGNOSIS — M170 Bilateral primary osteoarthritis of knee: Secondary | ICD-10-CM | POA: Diagnosis not present

## 2019-04-12 DIAGNOSIS — Z1231 Encounter for screening mammogram for malignant neoplasm of breast: Secondary | ICD-10-CM

## 2019-04-12 DIAGNOSIS — M25561 Pain in right knee: Secondary | ICD-10-CM

## 2019-04-12 DIAGNOSIS — M25562 Pain in left knee: Secondary | ICD-10-CM

## 2019-04-12 MED ORDER — MELOXICAM 15 MG PO TABS: 15.0000 mg | ORAL_TABLET | Freq: Every day | ORAL | 0 refills | Status: DC

## 2019-04-12 NOTE — Telephone Encounter (Signed)
Can we please get patient approved for bilateral gel injections? 

## 2019-04-12 NOTE — Telephone Encounter (Signed)
Submitted for VOB for Monov[facility]-Bilateral knee  

## 2019-04-13 ENCOUNTER — Encounter: Payer: Self-pay | Admitting: Orthopedic Surgery

## 2019-04-13 ENCOUNTER — Encounter (INDEPENDENT_AMBULATORY_CARE_PROVIDER_SITE_OTHER): Payer: Self-pay | Admitting: Primary Care

## 2019-04-13 NOTE — Progress Notes (Signed)
Office Visit Note   Patient: Whitney Thornton           Date of Birth: 06-27-1965           MRN: [id number] Visit Date: 04/12/2019 Requested by: Grayce Sessions, NP [address] [place],  [place] [address] PCP: Grayce Sessions, NP  Subjective: Chief Complaint  Patient presents with  . Right Knee - Pain  . Left Knee - Pain    HPI: Whitney Thornton is a 54 year old patient with bilateral knee pain.  She is reticent to get cortisone injections.  Denies any discrete injury but does report pain which has been worsening over the past several months.  She tried some over-the-counter medication without much relief.  Tried activity modification also without relief.  Denies much in the way of mechanical symptoms.  Denies any radicular pain from the back              ROS: All systems reviewed are negative as they relate to the chief complaint within the history of present illness.  Patient denies  fevers or chills.   Assessment & Plan: Visit Diagnoses:  1. Pain in both knees, unspecified chronicity     Plan: Impression is bilateral knee pain with radiographs which show only mild degenerative changes.  Collateral crucial ligaments are stable.  Hard to say exactly what is going on with her knees but it does not really look much like a surgical problem based on absence of effusion or mechanical symptoms.  I would favor gel injection first since she is not too keen on cortisone.  We will see if that works and if not could consider further imaging to delineate any potentially arthroscopically treatable pathology.  Follow-Up Instructions: No follow-ups on file.   Orders:  Orders Placed This Encounter  Procedures  . XR Knee 1-2 Views Right  . XR KNEE 3 VIEW LEFT   Meds ordered this encounter  Medications  . meloxicam (MOBIC) 15 MG tablet    Sig: Take 1 tablet (15 mg total) by mouth daily.    Dispense:  30 tablet    Refill:  0      Procedures: No procedures  performed   Clinical Data: No additional findings.  Objective: Vital Signs: Ht 5\' 6"  (1.676 m)   Wt 190 lb (86.2 kg)   BMI 30.67 kg/m   Physical Exam:   Constitutional: Patient appears well-developed HEENT:  Head: Normocephalic Eyes:EOM are normal Neck: Normal range of motion Cardiovascular: Normal rate Pulmonary/chest: Effort normal Neurologic: Patient is alert Skin: Skin is warm Psychiatric: Patient has normal mood and affect    Ortho Exam: Ortho exam demonstrates normal gait alignment.  No groin pain with internal X external rotation leg.  No effusion in either knee.  Collateral cruciate ligaments are stable.  Mild varus alignment present.  Extensor mechanism is intact.  Pedal pulses palpable.  No other masses lymphadenopathy or skin changes noted in that left knee or right knee region.  Range of motion essentially is full.  No focal joint line tenderness medially or laterally on either knee.  Specialty Comments:  No specialty comments available.  Imaging: No results found.   PMFS History: There are no problems to display for this patient.  Past Medical History:  Diagnosis Date  . Diabetes mellitus without complication (HCC)   . Nephritis     History reviewed. No pertinent family history.  Past Surgical History:  Procedure Laterality Date  . ABDOMINAL HYSTERECTOMY    .  CESAREAN SECTION    . CHOLECYSTECTOMY    . KNEE SURGERY Bilateral   . SINUS EXPLORATION     sinus surgery   Social History   Occupational History  . Not on file  Tobacco Use  . Smoking status: Never Smoker  . Smokeless tobacco: Never Used  Substance and Sexual Activity  . Alcohol use: Not Currently  . Drug use: Not Currently  . Sexual activity: Not on file

## 2019-04-16 ENCOUNTER — Encounter (INDEPENDENT_AMBULATORY_CARE_PROVIDER_SITE_OTHER): Payer: Self-pay | Admitting: Primary Care

## 2019-04-19 ENCOUNTER — Telehealth: Payer: Self-pay

## 2019-04-19 NOTE — Telephone Encounter (Signed)
Pending dictation  

## 2019-04-19 NOTE — Telephone Encounter (Signed)
Patients insurance requires PA for gel injections. Please advise once note is finished to submit the PA. Thank you

## 2019-04-20 ENCOUNTER — Encounter (INDEPENDENT_AMBULATORY_CARE_PROVIDER_SITE_OTHER): Payer: Self-pay | Admitting: Primary Care

## 2019-04-23 NOTE — Telephone Encounter (Signed)
Done. thanks

## 2019-04-23 NOTE — Telephone Encounter (Signed)
Can either of you please dictate this so we can get patient approved for injection?

## 2019-04-24 ENCOUNTER — Telehealth: Payer: Self-pay

## 2019-04-24 NOTE — Telephone Encounter (Signed)
Approved for Monovisc-Bilateral knee Dr. Myrtie Cruise and Annette Stable No copay 15% OOP-Must meet deductible first Prior auth required with [employer] # [id number] Dates: 04/24/19-05/22/19

## 2019-04-24 NOTE — Telephone Encounter (Signed)
Dictated

## 2019-04-24 NOTE — Telephone Encounter (Signed)
Faxed note and PA form to [place]

## 2019-04-25 NOTE — Telephone Encounter (Signed)
Pt called and informed and stated understanding. Appointment was made

## 2019-04-26 ENCOUNTER — Ambulatory Visit: Payer: Managed Care, Other (non HMO) | Admitting: Registered"

## 2019-05-07 ENCOUNTER — Ambulatory Visit (INDEPENDENT_AMBULATORY_CARE_PROVIDER_SITE_OTHER): Payer: Managed Care, Other (non HMO) | Admitting: Orthopedic Surgery

## 2019-05-07 ENCOUNTER — Encounter: Payer: Self-pay | Admitting: Orthopedic Surgery

## 2019-05-07 ENCOUNTER — Other Ambulatory Visit: Payer: Self-pay

## 2019-05-07 DIAGNOSIS — M170 Bilateral primary osteoarthritis of knee: Secondary | ICD-10-CM

## 2019-05-07 MED ORDER — METHYLPREDNISOLONE ACETATE 40 MG/ML IJ SUSP
40.0000 mg | INTRAMUSCULAR | Status: DC | PRN
  Administered 2019-05-07: 40 mg via INTRA_ARTICULAR

## 2019-05-07 MED ORDER — LIDOCAINE HCL 1 % IJ SOLN
5.0000 mL | INTRAMUSCULAR | Status: AC | PRN
  Administered 2019-05-07: 5 mL

## 2019-05-07 NOTE — Progress Notes (Addendum)
   Procedure Note  Patient: Whitney Thornton             Date of Birth: 02-12-1965           MRN: [id number]             Visit Date: 05/07/2019  Procedures: Visit Diagnoses:  1. Primary osteoarthritis of both knees     Large Joint Inj: bilateral knee on 05/07/2019 9:25 PM Indications: pain, joint swelling and diagnostic evaluation Details: 18 G 1.5 in needle, superolateral approach  Arthrogram: No  Medications (Right): 5 mL lidocaine 1 %; 88 mg Hyaluronan 88 MG/4ML Medications (Left): 5 mL lidocaine 1 %; 88 mg Hyaluronan 88 MG/4ML Outcome: tolerated well, no immediate complications Procedure, treatment alternatives, risks and benefits explained, specific risks discussed. Consent was given by the patient. Immediately prior to procedure a time out was called to verify the correct patient, procedure, equipment, support staff and site/side marked as required. Patient was prepped and draped in the usual sterile fashion.

## 2019-05-28 MED ORDER — HYALURONAN 88 MG/4ML IX SOSY
88.0000 mg | PREFILLED_SYRINGE | INTRA_ARTICULAR | Status: AC | PRN
  Administered 2019-05-07: 88 mg via INTRA_ARTICULAR

## 2019-06-12 ENCOUNTER — Ambulatory Visit (INDEPENDENT_AMBULATORY_CARE_PROVIDER_SITE_OTHER): Payer: Managed Care, Other (non HMO) | Admitting: Primary Care

## 2019-06-12 ENCOUNTER — Other Ambulatory Visit: Payer: Self-pay

## 2019-06-12 ENCOUNTER — Encounter (INDEPENDENT_AMBULATORY_CARE_PROVIDER_SITE_OTHER): Payer: Self-pay | Admitting: Primary Care

## 2019-06-12 VITALS — BP 115/82 | HR 103 | Temp 97.6°F | Ht 66.0 in | Wt 181.4 lb

## 2019-06-12 DIAGNOSIS — R300 Dysuria: Secondary | ICD-10-CM | POA: Diagnosis not present

## 2019-06-12 DIAGNOSIS — E119 Type 2 diabetes mellitus without complications: Secondary | ICD-10-CM | POA: Diagnosis not present

## 2019-06-12 DIAGNOSIS — Z0000 Encounter for general adult medical examination without abnormal findings: Secondary | ICD-10-CM | POA: Diagnosis not present

## 2019-06-12 DIAGNOSIS — N3000 Acute cystitis without hematuria: Secondary | ICD-10-CM | POA: Diagnosis not present

## 2019-06-12 LAB — POCT GLYCOSYLATED HEMOGLOBIN (HGB A1C): Hemoglobin A1C: 7.5 % — AB (ref 4.0–5.6)

## 2019-06-12 LAB — POCT URINALYSIS DIP (CLINITEK)
Blood, UA: NEGATIVE
Glucose, UA: 100 mg/dL — AB
Leukocytes, UA: NEGATIVE
Nitrite, UA: POSITIVE — AB
POC PROTEIN,UA: 100 — AB
Spec Grav, UA: >=1.03 — AB (ref 1.010–1.025)
Urobilinogen, UA: 1 E.U./dL
pH, UA: 5.5 (ref 5.0–8.0)

## 2019-06-12 LAB — POCT CBG (FASTING - GLUCOSE)-MANUAL ENTRY: Glucose Fasting, POC: 279 mg/dL — AB (ref 70–99)

## 2019-06-12 MED ORDER — PHENAZOPYRIDINE HCL 100 MG PO TABS: 100.0000 mg | ORAL_TABLET | Freq: Three times a day (TID) | ORAL | 0 refills | Status: DC | PRN

## 2019-06-12 NOTE — Progress Notes (Signed)
[facility]   Whitney Thornton is a 54 y.o. female who presents for an follow up evaluation of Type 2 diabetes mellitus.  Current symptoms/problems include hyperglycemia, nausea and polyuria, lightheaded and dizziness and have been worsening. Symptoms have been present for 4 days.  Current diabetic medications include oral agents (dual therapy):metformin 500mg  XR (1000mg  twice daily) glipizide GITS (Glucotrol-XL) and insulin injections: Basaglar.   The patient was initially diagnosed with Type 2 diabetes mellitus based on the following criteria: ADA- A1C 7.5  Current monitoring regimen: home blood tests - daily Home blood sugar records: fasting range: 150-200 Any episodes of hypoglycemia? no  Known diabetic complications: cardiovascular disease Cardiovascular risk factors: diabetes mellitus and dyslipidemia Eye exam current (within one year): no Weight trend: stable Prior visit with CDE: no Current diet: in general, a "healthy" diet   Current exercise: aerobics and walking Medication Compliance?  Yes   Is She on ACE inhibitor or angiotensin II receptor blocker?  Yes  lisinopril (Prinivil)    Review of Systems  Gastrointestinal: Positive for nausea.  Genitourinary: Positive for frequency and urgency.  All other systems reviewed and are negative.   Objective:    BP 115/82 (BP Location: Right Arm, Patient Position: Sitting, Cuff Size: Normal)   Pulse (!) 103   Temp 97.6 F (36.4 C) (Temporal)   Ht 5\' 6"  (1.676 m)   Wt 181 lb 6.4 oz (82.3 kg)   SpO2 94%   BMI 29.28 kg/m   Physical Exam Vitals reviewed.  Constitutional:      Appearance: Normal appearance.  HENT:     Head: Normocephalic.  Cardiovascular:     Rate and Rhythm: Normal rate and regular rhythm.  Abdominal:     General: Bowel sounds are normal.  Musculoskeletal:        General: Normal range of motion.     Cervical back: Normal range of motion.  Skin:    General: Skin is warm  and dry.  Neurological:     Mental Status: She is alert and oriented to person, place, and time.  Psychiatric:        Mood and Affect: Mood normal.        Behavior: Behavior normal.        Thought Content: Thought content normal.      Lab Review Glucose (mg/dL)  Date Value  258 (H)   Glucose, Bld (mg/dL)  Date Value  335 (H)  12/04/2018 328 (H)  10/15/2018 205 (H)   CO2 (mmol/L)  Date Value  03/15/2019 20  01/24/2019 22  12/04/2018 22   BUN (mg/dL)  Date Value  03/17/2019 11  01/24/2019 13  12/04/2018 12  10/15/2018 15   Creatinine, Ser (mg/dL)  Date Value  03/24/2019 0.68  01/24/2019 0.66  12/04/2018 0.96      Assessment:  Whitney Thornton was seen today for diabetes. Dysuria, and health maintenance   Diagnoses and all orders for this visit:   Plan:    Type 2 diabetes mellitus without complication, without long-term current use of insulin (HCC   HgB A1c -     Glucose (CBG), Fasting 1.  Rx changes: none 2.  Education: Reviewed 'ABCs' of diabetes management (respective goals in parentheses):  A1C (<7), blood pressure (<130/80), and cholesterol (LDL <100). 3. Discussed pathophysiology of DM; difference between type 1 and type 2 DM. 4. CHO counting diet discussed.  Reviewed CHO amount in various foods and how to read nutrition labels.  Discussed recommended serving  sizes.  5.  Recommend check BG 3  times a day 6.  Recommended increase physical activity - goal is 150 minutes per week 7. Follow up: 3 months   Dysuria -     POCT URINALYSIS DIP (CLINITEK) -     phenazopyridine (PYRIDIUM) 100 MG tablet; Take 1 tablet (100 mg total) by mouth 3 (three) times daily as needed for pain.  Healthcare maintenance -     TSH + free T4; Future -     TSH + free T4  Acute cystitis without hematuria -     Urine Culture -     phenazopyridine (PYRIDIUM) 100 MG tablet; Take 1 tablet (100 mg total) by mouth 3 (three) times daily as needed for pain.

## 2019-06-12 NOTE — Patient Instructions (Signed)

## 2019-06-12 NOTE — Progress Notes (Signed)
Pt would like to have thyroid checked  Pt believes she has a kidney infection again

## 2019-06-13 LAB — TSH+FREE T4
Free T4: 0.74 ng/dL — ABNORMAL LOW (ref 0.82–1.77)
TSH: 19.3 u[IU]/mL — ABNORMAL HIGH (ref 0.450–4.500)

## 2019-06-14 ENCOUNTER — Encounter (INDEPENDENT_AMBULATORY_CARE_PROVIDER_SITE_OTHER): Payer: Self-pay | Admitting: Primary Care

## 2019-06-15 ENCOUNTER — Other Ambulatory Visit (INDEPENDENT_AMBULATORY_CARE_PROVIDER_SITE_OTHER): Payer: Self-pay | Admitting: Primary Care

## 2019-06-15 ENCOUNTER — Encounter: Payer: Self-pay | Admitting: Orthopedic Surgery

## 2019-06-15 ENCOUNTER — Encounter (INDEPENDENT_AMBULATORY_CARE_PROVIDER_SITE_OTHER): Payer: Self-pay | Admitting: Primary Care

## 2019-06-15 DIAGNOSIS — E039 Hypothyroidism, unspecified: Secondary | ICD-10-CM

## 2019-06-15 MED ORDER — LEVOTHYROXINE SODIUM 100 MCG PO TABS: 100.0000 ug | ORAL_TABLET | Freq: Every day | ORAL | 3 refills | Status: DC

## 2019-06-15 NOTE — Telephone Encounter (Signed)
Needs MRI scan bilateral knees.  Thanks

## 2019-06-17 LAB — URINE CULTURE

## 2019-06-19 ENCOUNTER — Other Ambulatory Visit: Payer: Self-pay

## 2019-06-19 DIAGNOSIS — M1712 Unilateral primary osteoarthritis, left knee: Secondary | ICD-10-CM

## 2019-06-19 DIAGNOSIS — M1711 Unilateral primary osteoarthritis, right knee: Secondary | ICD-10-CM

## 2019-06-21 ENCOUNTER — Other Ambulatory Visit (INDEPENDENT_AMBULATORY_CARE_PROVIDER_SITE_OTHER): Payer: Self-pay | Admitting: Primary Care

## 2019-06-21 ENCOUNTER — Telehealth (INDEPENDENT_AMBULATORY_CARE_PROVIDER_SITE_OTHER): Payer: Self-pay

## 2019-06-21 DIAGNOSIS — N3000 Acute cystitis without hematuria: Secondary | ICD-10-CM

## 2019-06-21 MED ORDER — AMOXICILLIN-POT CLAVULANATE 875-125 MG PO TABS: 1.0000 | ORAL_TABLET | Freq: Two times a day (BID) | ORAL | 0 refills | Status: DC

## 2019-06-21 NOTE — Telephone Encounter (Signed)
Patient called about urinary infection. She would like antibiotic and something for pain sent to [facility] pharmacy on [address]. Maryjean Morn, CMA e

## 2019-06-28 ENCOUNTER — Encounter (INDEPENDENT_AMBULATORY_CARE_PROVIDER_SITE_OTHER): Payer: Self-pay | Admitting: Primary Care

## 2019-06-29 ENCOUNTER — Telehealth (INDEPENDENT_AMBULATORY_CARE_PROVIDER_SITE_OTHER): Payer: Self-pay

## 2019-06-29 NOTE — Telephone Encounter (Signed)
Patient called requesting medication for pain due to her UTI. She also sent a request via mychart on 06/28/19. She also states she has been trying to reach PCP to discuss lab results. States she received call from PCP but missed the call. There is no documentation of such in her chart. Please send medication if appropriate and contact patient about lab results concern. Maryjean Morn, CMA

## 2019-06-30 NOTE — Telephone Encounter (Signed)
Call for a nurse visit for urinalysis.

## 2019-07-02 ENCOUNTER — Other Ambulatory Visit (INDEPENDENT_AMBULATORY_CARE_PROVIDER_SITE_OTHER): Payer: Self-pay | Admitting: Primary Care

## 2019-07-02 DIAGNOSIS — E119 Type 2 diabetes mellitus without complications: Secondary | ICD-10-CM

## 2019-07-02 MED ORDER — LISINOPRIL 2.5 MG PO TABS: 5.0000 mg | ORAL_TABLET | Freq: Every day | ORAL | 1 refills | Status: DC

## 2019-07-02 MED ORDER — METFORMIN HCL ER 500 MG PO TB24: 1000.0000 mg | ORAL_TABLET | Freq: Two times a day (BID) | ORAL | 1 refills | Status: DC

## 2019-07-03 ENCOUNTER — Other Ambulatory Visit: Payer: Self-pay

## 2019-07-03 ENCOUNTER — Ambulatory Visit (INDEPENDENT_AMBULATORY_CARE_PROVIDER_SITE_OTHER): Payer: Managed Care, Other (non HMO)

## 2019-07-03 DIAGNOSIS — R300 Dysuria: Secondary | ICD-10-CM | POA: Diagnosis not present

## 2019-07-03 LAB — POCT URINALYSIS DIP (CLINITEK)
Bilirubin, UA: NEGATIVE
Blood, UA: NEGATIVE
Glucose, UA: 100 mg/dL — AB
Ketones, POC UA: NEGATIVE mg/dL
Leukocytes, UA: NEGATIVE
Nitrite, UA: NEGATIVE
POC PROTEIN,UA: NEGATIVE
Spec Grav, UA: >=1.03 — AB (ref 1.010–1.025)
Urobilinogen, UA: 1 E.U./dL
pH, UA: 6 (ref 5.0–8.0)

## 2019-07-04 ENCOUNTER — Other Ambulatory Visit (INDEPENDENT_AMBULATORY_CARE_PROVIDER_SITE_OTHER): Payer: Self-pay | Admitting: Primary Care

## 2019-07-04 DIAGNOSIS — E039 Hypothyroidism, unspecified: Secondary | ICD-10-CM

## 2019-07-04 DIAGNOSIS — E119 Type 2 diabetes mellitus without complications: Secondary | ICD-10-CM

## 2019-07-04 MED ORDER — BASAGLAR KWIKPEN 100 UNIT/ML ~~LOC~~ SOPN: 15.0000 [IU] | PEN_INJECTOR | Freq: Every day | SUBCUTANEOUS | 1 refills | Status: DC

## 2019-07-04 MED ORDER — SIMVASTATIN 40 MG PO TABS: 40.0000 mg | ORAL_TABLET | Freq: Every day | ORAL | 1 refills | Status: DC

## 2019-07-04 MED ORDER — EZETIMIBE 10 MG PO TABS: 10.0000 mg | ORAL_TABLET | Freq: Every day | ORAL | 1 refills | Status: DC

## 2019-07-04 MED ORDER — LEVOTHYROXINE SODIUM 100 MCG PO TABS: 100.0000 ug | ORAL_TABLET | Freq: Every day | ORAL | 0 refills | Status: DC

## 2019-07-04 MED ORDER — GLIPIZIDE ER 10 MG PO TB24: 10.0000 mg | ORAL_TABLET | Freq: Two times a day (BID) | ORAL | 1 refills | Status: DC

## 2019-07-05 ENCOUNTER — Encounter (INDEPENDENT_AMBULATORY_CARE_PROVIDER_SITE_OTHER): Payer: Self-pay | Admitting: Primary Care

## 2019-07-05 LAB — URINE CULTURE

## 2019-07-06 ENCOUNTER — Encounter (INDEPENDENT_AMBULATORY_CARE_PROVIDER_SITE_OTHER): Payer: Self-pay | Admitting: Primary Care

## 2019-07-12 ENCOUNTER — Encounter (INDEPENDENT_AMBULATORY_CARE_PROVIDER_SITE_OTHER): Payer: Self-pay | Admitting: Primary Care

## 2019-07-13 ENCOUNTER — Encounter (HOSPITAL_COMMUNITY): Payer: Self-pay | Admitting: Emergency Medicine

## 2019-07-13 ENCOUNTER — Emergency Department (HOSPITAL_COMMUNITY)
Admission: EM | Admit: 2019-07-13 | Discharge: 2019-07-14 | Disposition: A | Discharge status: Home / Self Care | Payer: Managed Care, Other (non HMO) | Attending: Emergency Medicine | Admitting: Emergency Medicine

## 2019-07-13 DIAGNOSIS — N3000 Acute cystitis without hematuria: Secondary | ICD-10-CM

## 2019-07-13 DIAGNOSIS — E119 Type 2 diabetes mellitus without complications: Secondary | ICD-10-CM | POA: Insufficient documentation

## 2019-07-13 DIAGNOSIS — B3731 Acute candidiasis of vulva and vagina: Secondary | ICD-10-CM

## 2019-07-13 DIAGNOSIS — Z79899 Other long term (current) drug therapy: Secondary | ICD-10-CM | POA: Diagnosis not present

## 2019-07-13 DIAGNOSIS — Z794 Long term (current) use of insulin: Secondary | ICD-10-CM | POA: Diagnosis not present

## 2019-07-13 DIAGNOSIS — R350 Frequency of micturition: Secondary | ICD-10-CM | POA: Diagnosis present

## 2019-07-13 DIAGNOSIS — Z7982 Long term (current) use of aspirin: Secondary | ICD-10-CM | POA: Diagnosis not present

## 2019-07-13 DIAGNOSIS — B373 Candidiasis of vulva and vagina: Secondary | ICD-10-CM | POA: Diagnosis not present

## 2019-07-13 LAB — BASIC METABOLIC PANEL
Anion gap: 13 (ref 5–15)
BUN: 9 mg/dL (ref 6–20)
CO2: 21 mmol/L — ABNORMAL LOW (ref 22–32)
Calcium: 9.8 mg/dL (ref 8.9–10.3)
Chloride: 103 mmol/L (ref 98–111)
Creatinine, Ser: 0.92 mg/dL (ref 0.44–1.00)
GFR calc Af Amer: >60 mL/min (ref >60)
GFR calc non Af Amer: >60 mL/min (ref >60)
Glucose, Bld: 352 mg/dL — ABNORMAL HIGH (ref 70–99)
Potassium: 4.4 mmol/L (ref 3.5–5.1)
Sodium: 137 mmol/L (ref 135–145)

## 2019-07-13 LAB — URINALYSIS, ROUTINE W REFLEX MICROSCOPIC
Bilirubin Urine: NEGATIVE
Glucose, UA: >=500 mg/dL — AB
Hgb urine dipstick: NEGATIVE
Ketones, ur: NEGATIVE mg/dL
Nitrite: POSITIVE — AB
Protein, ur: NEGATIVE mg/dL
Specific Gravity, Urine: 1.03 (ref 1.005–1.030)
pH: 5 (ref 5.0–8.0)

## 2019-07-13 LAB — CBC
HCT: 44 % (ref 36.0–46.0)
Hemoglobin: 14.2 g/dL (ref 12.0–15.0)
MCH: 29 pg (ref 26.0–34.0)
MCHC: 32.3 g/dL (ref 30.0–36.0)
MCV: 89.8 fL (ref 80.0–100.0)
Platelets: 266 10*3/uL (ref 150–400)
RBC: 4.9 MIL/uL (ref 3.87–5.11)
RDW: 13.4 % (ref 11.5–15.5)
WBC: 8.5 10*3/uL (ref 4.0–10.5)
nRBC: 0 % (ref 0.0–0.2)

## 2019-07-13 LAB — I-STAT BETA HCG BLOOD, ED (MC, WL, AP ONLY): I-stat hCG, quantitative: <5 m[IU]/mL (ref <5)

## 2019-07-13 NOTE — ED Triage Notes (Signed)
Pt reports she had labs done for life insurance and had abnormal liver tests. States she talked to her PCP who advised that her labs were fine, pt has screen shots of blood work. AST and ALT are elevated. Pt also endorses recurring UTI, on recent abx 1.5-2 weeks ago, feels like she has another uti now.

## 2019-07-13 NOTE — ED Provider Notes (Signed)
[facility] EMERGENCY DEPARTMENT Provider Note   CSN: [id number] Arrival date & time: 07/13/19  1421     History Chief Complaint  Patient presents with  . Urinary Tract Infection  . Abnormal Lab    Whitney Thornton is a 54 y.o. female.  Patient to ED with symptoms of urinary frequency, dysuria that have been persistent since previous diagnosis of UTI 06/12/19. She reports she started a 10-day course of antibiotics on 06/21/19, delay due to ability to obtain medications. She went back to her doctor 07/03/19 and was found to have a clear urine and negative cultures at that time. She reports her symptoms never went away and she is now having additional symptoms of a white vaginal discharge and vaginal itching. No fever, nausea, vomiting. She also reports she recently had labs done for a life insurance policy and was found to have "abnormal liver functions, abnormal kidney functions and abnormal thyroid levels."   The history is provided by the patient. No language interpreter was used.  Urinary Tract Infection Associated symptoms: vaginal discharge   Associated symptoms: no abdominal pain and no fever   Abnormal Lab      Past Medical History:  Diagnosis Date  . Diabetes mellitus without complication ([place])   . Nephritis     There are no problems to display for this patient.   Past Surgical History:  Procedure Laterality Date  . ABDOMINAL HYSTERECTOMY    . CESAREAN SECTION    . CHOLECYSTECTOMY    . KNEE SURGERY Bilateral   . SINUS EXPLORATION     sinus surgery     OB History   No obstetric history on file.     No family history on file.  Social History   Tobacco Use  . Smoking status: Never Smoker  . Smokeless tobacco: Never Used  Substance Use Topics  . Alcohol use: Not Currently  . Drug use: Not Currently    Home Medications Prior to Admission medications   Medication Sig Start Date End Date Taking? Authorizing Provider    amoxicillin-clavulanate (AUGMENTIN) 875-125 MG tablet Take 1 tablet by mouth 2 (two) times daily. 06/21/19   Kerin Perna, NP  aspirin EC 81 MG tablet Take 81 mg by mouth at bedtime.    [provider]  cyclobenzaprine (FLEXERIL) 10 MG tablet Take 1 tablet (10 mg total) by mouth 3 (three) times daily as needed for muscle spasms. 03/15/19   Kerin Perna, NP  diphenhydramine-acetaminophen (TYLENOL PM) 25-500 MG TABS tablet Take 1 tablet by mouth at bedtime as needed (sleep/pain).    [provider]  ezetimibe (ZETIA) 10 MG tablet Take 1 tablet (10 mg total) by mouth daily. 07/04/19   Kerin Perna, NP  glipiZIDE (GLUCOTROL XL) 10 MG 24 hr tablet Take 1 tablet (10 mg total) by mouth 2 (two) times daily after a meal. 07/04/19   Kerin Perna, NP  Insulin Glargine (BASAGLAR KWIKPEN) 100 UNIT/ML Inject 0.15 mLs (15 Units total) into the skin at bedtime. 07/04/19   Kerin Perna, NP  levothyroxine (SYNTHROID) 100 MCG tablet Take 1 tablet (100 mcg total) by mouth daily. 07/04/19   Kerin Perna, NP  lisinopril (ZESTRIL) 2.5 MG tablet Take 2 tablets (5 mg total) by mouth daily. 07/02/19   Kerin Perna, NP  metFORMIN (GLUCOPHAGE-XR) 500 MG 24 hr tablet Take 2 tablets (1,000 mg total) by mouth 2 (two) times daily with a meal. 07/02/19   Juluis Mire  P, NP  phenazopyridine (PYRIDIUM) 100 MG tablet Take 1 tablet (100 mg total) by mouth 3 (three) times daily as needed for pain. 06/12/19   Grayce Sessions, NP  simvastatin (ZOCOR) 40 MG tablet Take 1 tablet (40 mg total) by mouth at bedtime. 07/04/19   Grayce Sessions, NP    Allergies    Celebrex [celecoxib]  Review of Systems   Review of Systems  Constitutional: Negative for chills and fever.  HENT: Negative.   Respiratory: Negative.   Cardiovascular: Negative.   Gastrointestinal: Negative.  Negative for abdominal pain.  Genitourinary: Positive for dysuria, frequency and vaginal discharge.   Musculoskeletal: Negative.   Skin: Negative.   Neurological: Negative.     Physical Exam Updated Vital Signs BP 123/69 (BP Location: Right Arm)   Pulse 77   Temp 98.2 F (36.8 C) (Oral)   Resp 16   SpO2 99%   Physical Exam Constitutional:      Appearance: She is well-developed.  Pulmonary:     Effort: Pulmonary effort is normal.  Abdominal:     General: There is no distension.     Palpations: Abdomen is soft.     Tenderness: There is no abdominal tenderness. There is no right CVA tenderness or left CVA tenderness.  Musculoskeletal:     Cervical back: Normal range of motion.  Skin:    General: Skin is warm and dry.  Neurological:     Mental Status: She is alert and oriented to person, place, and time.     ED Results / Procedures / Treatments   Labs (all labs ordered are listed, but only abnormal results are displayed) Labs Reviewed  URINALYSIS, ROUTINE W REFLEX MICROSCOPIC - Abnormal; Notable for the following components:      Result Value   APPearance HAZY (*)    Glucose, UA >=500 (*)    Nitrite POSITIVE (*)    Leukocytes,Ua TRACE (*)    Bacteria, UA MANY (*)    All other components within normal limits  BASIC METABOLIC PANEL - Abnormal; Notable for the following components:   CO2 21 (*)    Glucose, Bld 352 (*)    All other components within normal limits  URINE CULTURE  WET PREP, GENITAL  CBC  I-STAT BETA HCG BLOOD, ED ([facility], [facility], [facility] ONLY)  GC/CHLAMYDIA PROBE AMP ([place]) NOT AT [facility]    EKG None  Radiology No results found.  Procedures Procedures (including critical care time)  Medications Ordered in ED Medications - No data to display  ED Course  I have reviewed the triage vital signs and the nursing notes.  Pertinent labs & imaging results that were available during my care of the patient were reviewed by me and considered in my medical decision making (see chart for details).    MDM Rules/Calculators/A&P                           Patient to ED with symptoms of urinary tract infection of frequency and burning. Symptoms constant since last diagnosis earlier in the month. No fever, abdominal pain, vomiting. She also c/o vaginal itching and white discharge. H/O hysterectomy.   She is well appearing. VSS, afebrile. UA c/w UTI. Culture added given multiple/recurrent infections. Chart reviewed. Labs confirm UTI on 5/25, Klebsiella positive culture that was essentially pansensitive. Subsequent UA and culture on 6/15 negative.   UA c/w recurrent infection. Vaginal sxs and exam supports yeast infection.   CBG  high at 352 without evidence to suggest DKA. She reports she just got her medications via mail service and is now on a regular regimen.   She voices discontent with her current primary care clinic. Recommended she consider endocrinology with DM and recent abnormal thyroid studies.   Will start on Keflex. Recommend Monostat for symptomatic relief until she finishes abx, then fill and take Rx for Diflucan.    Final Clinical Impression(s) / ED Diagnoses Final diagnoses:  None   1. Recurrent UTI 2. Vaginal yeast infection 3. Hyperglycemia  Rx / DC Orders ED Discharge Orders    None       Danne Harbor 07/14/19 2209    Derwood Kaplan, MD 07/14/19 2317

## 2019-07-14 LAB — WET PREP, GENITAL
Clue Cells Wet Prep HPF POC: NONE SEEN
Sperm: NONE SEEN
Trich, Wet Prep: NONE SEEN
Yeast Wet Prep HPF POC: NONE SEEN

## 2019-07-14 MED ORDER — KETOROLAC TROMETHAMINE 30 MG/ML IJ SOLN
30.0000 mg | Freq: Once | INTRAMUSCULAR | Status: AC
  Administered 2019-07-14: 30 mg via INTRAMUSCULAR

## 2019-07-14 MED ORDER — CEPHALEXIN 250 MG PO CAPS
500.0000 mg | ORAL_CAPSULE | Freq: Once | ORAL | Status: AC
  Administered 2019-07-14: 500 mg via ORAL
  Filled 2019-07-14: qty 2

## 2019-07-14 MED ORDER — CEPHALEXIN 500 MG PO CAPS
500.0000 mg | ORAL_CAPSULE | Freq: Four times a day (QID) | ORAL | 0 refills | Status: DC
Start: 2019-07-14 — End: 2019-08-24

## 2019-07-14 MED ORDER — KETOROLAC TROMETHAMINE 60 MG/2ML IM SOLN
15.0000 mg | Freq: Once | INTRAMUSCULAR | Status: DC
  Filled 2019-07-14: qty 2

## 2019-07-14 MED ORDER — FLUCONAZOLE 150 MG PO TABS
150.0000 mg | ORAL_TABLET | Freq: Every day | ORAL | 0 refills | Status: DC
Start: 2019-07-14 — End: 2019-08-28

## 2019-07-14 MED ORDER — KETOROLAC TROMETHAMINE 10 MG PO TABS: 10.0000 mg | ORAL_TABLET | Freq: Four times a day (QID) | ORAL | 0 refills | Status: DC | PRN

## 2019-07-14 NOTE — ED Notes (Signed)
Patient transported to X-ray 

## 2019-07-14 NOTE — ED Notes (Signed)
Patient verbalizes understanding of discharge instructions. Opportunity for questioning and answers were provided. Armband removed by staff, pt discharged from ED ambulatory to home.  

## 2019-07-14 NOTE — Discharge Instructions (Signed)
Take the medications until completed. Take the Diflucan after completing the antibiotic prescription.   Follow up with your primary care physician or your choice of alternative primary care provider (we discussed [facility] or [facility] groups) for further management of persistent/recurrent urinary tract infection and management of your chronic conditions.

## 2019-07-15 LAB — URINE CULTURE: Culture: >=100000 — AB

## 2019-07-16 ENCOUNTER — Telehealth: Payer: Self-pay

## 2019-07-16 NOTE — Telephone Encounter (Signed)
Post ED Visit - Positive Culture Follow-up  Culture report reviewed by antimicrobial stewardship pharmacist: [facility] []  , Pharm.D. []  Enzo Bi, .D., BCPS AQ-ID []  Celedonio Miyamoto, Pharm.D., BCPS []  [address], Pharm.D., BCPS []  [place], Garvin Fila.D., BCPS, AAHIVP []  , Pharm.D., BCPS, AAHIVP []  Georgina Pillion, PharmD, BCPS []  , PharmD, BCPS []  [place], PharmD, BCPS []  [address], PharmD []  , PharmD, BCPS []  Estella Husk, PharmD Pharm [facility] []  , PharmD []  Phillips Climes, PharmD []  , PharmD []  Agapito Games, Rph []  ) Verlan Friends, PharmD []  , PharmD []  Mervyn Gay, PharmD []  , PharmD []  Vinnie Level, PharmD []  Corinna Gab, PharmD []  Autumn Patty, PharmD []  , PharmD []  Len Childs, PharmD   Positive urine culture Treated with Cephalexin, organism sensitive to the same and no further patient follow-up is required at this time.  07/16/2019, 12:13 PM

## 2019-07-19 ENCOUNTER — Ambulatory Visit
Admission: RE | Admit: 2019-07-19 | Discharge: 2019-07-19 | Disposition: A | Discharge status: Home / Self Care | Payer: Managed Care, Other (non HMO) | Source: Ambulatory Visit | Attending: Orthopedic Surgery | Admitting: Orthopedic Surgery

## 2019-07-19 DIAGNOSIS — M1711 Unilateral primary osteoarthritis, right knee: Secondary | ICD-10-CM

## 2019-07-19 DIAGNOSIS — M1712 Unilateral primary osteoarthritis, left knee: Secondary | ICD-10-CM

## 2019-07-26 ENCOUNTER — Encounter (INDEPENDENT_AMBULATORY_CARE_PROVIDER_SITE_OTHER): Payer: Self-pay | Admitting: Primary Care

## 2019-07-26 ENCOUNTER — Ambulatory Visit (INDEPENDENT_AMBULATORY_CARE_PROVIDER_SITE_OTHER): Payer: Managed Care, Other (non HMO) | Admitting: Orthopedic Surgery

## 2019-07-26 DIAGNOSIS — M1712 Unilateral primary osteoarthritis, left knee: Secondary | ICD-10-CM

## 2019-07-26 DIAGNOSIS — M170 Bilateral primary osteoarthritis of knee: Secondary | ICD-10-CM

## 2019-07-26 DIAGNOSIS — M1711 Unilateral primary osteoarthritis, right knee: Secondary | ICD-10-CM | POA: Diagnosis not present

## 2019-07-27 ENCOUNTER — Encounter (INDEPENDENT_AMBULATORY_CARE_PROVIDER_SITE_OTHER): Payer: Self-pay

## 2019-07-28 ENCOUNTER — Encounter: Payer: Self-pay | Admitting: Orthopedic Surgery

## 2019-07-28 DIAGNOSIS — M1711 Unilateral primary osteoarthritis, right knee: Secondary | ICD-10-CM

## 2019-07-28 MED ORDER — LIDOCAINE HCL 1 % IJ SOLN
5.0000 mL | INTRAMUSCULAR | Status: AC | PRN
  Administered 2019-07-28: 5 mL

## 2019-07-28 MED ORDER — METHYLPREDNISOLONE ACETATE 40 MG/ML IJ SUSP
40.0000 mg | INTRAMUSCULAR | Status: AC | PRN
  Administered 2019-07-28: 40 mg via INTRA_ARTICULAR

## 2019-07-28 MED ORDER — BUPIVACAINE HCL 0.25 % IJ SOLN
4.0000 mL | INTRAMUSCULAR | Status: AC | PRN
  Administered 2019-07-28: 4 mL via INTRA_ARTICULAR

## 2019-07-28 NOTE — Progress Notes (Signed)
Office Visit Note   Patient: Whitney Thornton           Date of Birth: 1965/07/31           MRN: [id number] Visit Date: 07/26/2019 Requested by: Grayce Sessions, NP [address] [place],  [place] [address] PCP: Grayce Sessions, NP  Subjective: Chief Complaint  Patient presents with  . Follow-up    HPI: Whitney Thornton is a 54 y.o. female who presents to the office for bilateral knee MRI reviews.  MRI of the right knee revealed degeneration of the posterior horn the medial meniscus with possible tear as well as partial thickness cartilage loss of all 3 compartments.  MRI of the left knee revealed partial-thickness cartilage loss of all 3 compartments as well as degeneration of the posterior horn and body of the medial meniscus without tear and mild tendinosis of the patellar tendon.  She notes that gel shots made her pain worse.  Right knee bothers her as much as her left knee.  She has no history of cortisone injections due to history of diabetes.  She has recently gotten her diabetes under control with A1c dropping down from 7.3-11.5 in 3 months.  She denies any groin pain.  She wakes with pain.  I Profen Tylenol provided significant relief.  She localizes most of the pain to the medial aspect of bilateral knees.              ROS:  All systems reviewed are negative as they relate to the chief complaint within the history of present illness.  Patient denies fevers or chills.  Assessment & Plan: Visit Diagnoses:  1. Primary osteoarthritis of both knees     Plan:  Patient is a 54 year old female presents complaint of bilateral knee pain.  MRIs of the bilateral knees revealed arthritis with some the meniscal degeneration but no tear that would be amenable to operative treatment.  She had no significant relief with the gel injections and they in fact made her pain worse.  Plan to try right knee cortisone injection today as she has never had one before.  Her  A1c is down to 7.3.  Informed patient that her blood glucose level may rise over the next several days and she should keep an eye on it.  She understands.  If she has good relief from this injection, she may follow-up in 1 week for left knee injection.  Patient agreed with plan.  She tolerated the right knee cortisone injection well.  Follow-up in 1 week for scheduled left knee injection.  Follow-Up Instructions: No follow-ups on file.   Orders:  No orders of the defined types were placed in this encounter.  No orders of the defined types were placed in this encounter.     Procedures: Large Joint Inj: R knee on 07/28/2019 9:25 PM Indications: diagnostic evaluation, joint swelling and pain Details: 18 G 1.5 in needle, superolateral approach  Arthrogram: No  Medications: 5 mL lidocaine 1 %; 40 mg methylPREDNISolone acetate 40 MG/ML; 4 mL bupivacaine 0.25 % Outcome: tolerated well, no immediate complications Procedure, treatment alternatives, risks and benefits explained, specific risks discussed. Consent was given by the patient. Immediately prior to procedure a time out was called to verify the correct patient, procedure, equipment, support staff and site/side marked as required. Patient was prepped and draped in the usual sterile fashion.       Clinical Data: No additional findings.  Objective: Vital Signs: There were no  vitals taken for this visit.  Physical Exam:  Constitutional: Patient appears well-developed HEENT:  Head: Normocephalic Eyes:EOM are normal Neck: Normal range of motion Cardiovascular: Normal rate Pulmonary/chest: Effort normal Neurologic: Patient is alert Skin: Skin is warm Psychiatric: Patient has normal mood and affect  Ortho Exam:  Ortho exam demonstrates tenderness to palpation throughout the bilateral knees over the medial joint lines.  No significant tenderness to palpation over the lateral joint lines, quadriceps tendon, patella, pes anserinus,  fibular head.  She does have some mild tenderness over the patellar tendon of the left knee.  No effusions.  Range of motion is preserved. Extensor mechanisms intact.  No ligamentous laxity on exam. No groin pain elicited with ROM of the hips.    Specialty Comments:  No specialty comments available.  Imaging: No results found.   PMFS History: There are no problems to display for this patient.  Past Medical History:  Diagnosis Date  . Diabetes mellitus without complication (HCC)   . Nephritis     No family history on file.  Past Surgical History:  Procedure Laterality Date  . ABDOMINAL HYSTERECTOMY    . CESAREAN SECTION    . CHOLECYSTECTOMY    . KNEE SURGERY Bilateral   . SINUS EXPLORATION     sinus surgery   Social History   Occupational History  . Not on file  Tobacco Use  . Smoking status: Never Smoker  . Smokeless tobacco: Never Used  Substance and Sexual Activity  . Alcohol use: Not Currently  . Drug use: Not Currently  . Sexual activity: Not on file

## 2019-07-30 ENCOUNTER — Other Ambulatory Visit: Payer: Self-pay

## 2019-07-30 ENCOUNTER — Other Ambulatory Visit (INDEPENDENT_AMBULATORY_CARE_PROVIDER_SITE_OTHER): Payer: Managed Care, Other (non HMO)

## 2019-07-30 DIAGNOSIS — E039 Hypothyroidism, unspecified: Secondary | ICD-10-CM

## 2019-07-30 DIAGNOSIS — E119 Type 2 diabetes mellitus without complications: Secondary | ICD-10-CM

## 2019-07-31 LAB — COMPREHENSIVE METABOLIC PANEL
ALT: 50 IU/L — ABNORMAL HIGH (ref 0–32)
AST: 29 IU/L (ref 0–40)
Albumin/Globulin Ratio: 2.2 (ref 1.2–2.2)
Albumin: 4.2 g/dL (ref 3.8–4.9)
Alkaline Phosphatase: 113 IU/L (ref 48–121)
BUN/Creatinine Ratio: 27 — ABNORMAL HIGH (ref 9–23)
BUN: 18 mg/dL (ref 6–24)
Bilirubin Total: 0.3 mg/dL (ref 0.0–1.2)
CO2: 21 mmol/L (ref 20–29)
Calcium: 9.5 mg/dL (ref 8.7–10.2)
Chloride: 105 mmol/L (ref 96–106)
Creatinine, Ser: 0.66 mg/dL (ref 0.57–1.00)
GFR calc Af Amer: 117 mL/min/{1.73_m2} (ref >59)
GFR calc non Af Amer: 101 mL/min/{1.73_m2} (ref >59)
Globulin, Total: 1.9 g/dL (ref 1.5–4.5)
Glucose: 147 mg/dL — ABNORMAL HIGH (ref 65–99)
Potassium: 4.8 mmol/L (ref 3.5–5.2)
Sodium: 141 mmol/L (ref 134–144)
Total Protein: 6.1 g/dL (ref 6.0–8.5)

## 2019-07-31 LAB — TSH+FREE T4
Free T4: 1.71 ng/dL (ref 0.82–1.77)
TSH: 0.238 u[IU]/mL — ABNORMAL LOW (ref 0.450–4.500)

## 2019-08-01 ENCOUNTER — Other Ambulatory Visit (INDEPENDENT_AMBULATORY_CARE_PROVIDER_SITE_OTHER): Payer: Self-pay | Admitting: Primary Care

## 2019-08-01 DIAGNOSIS — E039 Hypothyroidism, unspecified: Secondary | ICD-10-CM

## 2019-08-02 ENCOUNTER — Encounter (INDEPENDENT_AMBULATORY_CARE_PROVIDER_SITE_OTHER): Payer: Self-pay | Admitting: Primary Care

## 2019-08-02 ENCOUNTER — Encounter: Payer: Self-pay | Admitting: Orthopedic Surgery

## 2019-08-03 NOTE — Telephone Encounter (Signed)
Ok to come in to talk about next step may need scope hard to say

## 2019-08-24 ENCOUNTER — Ambulatory Visit (INDEPENDENT_AMBULATORY_CARE_PROVIDER_SITE_OTHER): Payer: Managed Care, Other (non HMO) | Admitting: Primary Care

## 2019-08-24 ENCOUNTER — Other Ambulatory Visit: Payer: Self-pay

## 2019-08-24 ENCOUNTER — Encounter (INDEPENDENT_AMBULATORY_CARE_PROVIDER_SITE_OTHER): Payer: Self-pay | Admitting: Primary Care

## 2019-08-24 VITALS — BP 107/66 | HR 81 | Temp 97.7°F | Ht 66.0 in | Wt 184.8 lb

## 2019-08-24 DIAGNOSIS — N3000 Acute cystitis without hematuria: Secondary | ICD-10-CM | POA: Diagnosis not present

## 2019-08-24 DIAGNOSIS — R053 Chronic cough: Secondary | ICD-10-CM

## 2019-08-24 DIAGNOSIS — R300 Dysuria: Secondary | ICD-10-CM | POA: Diagnosis not present

## 2019-08-24 DIAGNOSIS — R05 Cough: Secondary | ICD-10-CM | POA: Diagnosis not present

## 2019-08-24 LAB — POCT URINALYSIS DIP (CLINITEK)
Glucose, UA: NEGATIVE mg/dL
Nitrite, UA: NEGATIVE
POC PROTEIN,UA: 30 — AB
Spec Grav, UA: 1.02 (ref 1.010–1.025)
Urobilinogen, UA: 1 E.U./dL
pH, UA: 7 (ref 5.0–8.0)

## 2019-08-24 MED ORDER — PHENAZOPYRIDINE HCL 100 MG PO TABS: 100.0000 mg | ORAL_TABLET | Freq: Three times a day (TID) | ORAL | 0 refills | Status: DC | PRN

## 2019-08-24 MED ORDER — ALBUTEROL SULFATE HFA 108 (90 BASE) MCG/ACT IN AERS: 1.0000 | INHALATION_SPRAY | Freq: Four times a day (QID) | RESPIRATORY_TRACT | 1 refills | Status: DC | PRN

## 2019-08-24 NOTE — Patient Instructions (Signed)
Diphenhydramine oral syrup or elixir What is this medicine? DIPHENHYDRAMINE (dye fen HYE dra meen) is an histamine blocker. It is used to treat the symptoms of an allergic reaction. This medicine may be used for other purposes; ask your health care provider or pharmacist if you have questions. COMMON BRAND NAME(S): Altaryl, Banophen, Benadryl, Benadryl Allergy, Benadryl Allergy Children's, Benadryl Children's Allergy, Benadryl Children's Perfect Measure, DIPHEN, Diphen AF, Diphenhist, ElixSure Allergy, Genahist, Geri-Dryl, Hydramine, M-Dryl, PediaCare Children's Allergy, PediaCare Nighttime Cough, PediaClear Children's Cough, Q-Dryl, Quenalin, Siladryl Allergy, Silphen, Tusstat, Vanamine PD What should I tell my health care provider before I take this medicine? They need to know if you have any of these conditions:  glaucoma  high blood pressure or heart disease  liver disease  lung or breathing disease, like asthma  pain or trouble passing urine  prostate trouble  ulcers or other stomach problems  an unusual or allergic reaction to diphenhydramine, other medicines foods, dyes, or preservatives such as sulfites  pregnant or trying to get pregnant  breast-feeding How should I use this medicine? Take this medicine by mouth with a full glass of water. Follow the directions on the prescription label. Use a specially marked spoon or container to measure your medicine. Household spoons are not accurate. Take your medicine at regular intervals. Do not take it more often than directed. Talk to your pediatrician regarding the use of this medicine in children. Special care may be needed. Patients over 65 years old may have a stronger reaction and need a smaller dose. Overdosage: If you think you have taken too much of this medicine contact a poison control center or emergency room at once. NOTE: This medicine is only for you. Do not share this medicine with others. What if I miss a dose? If  you miss a dose, take it as soon as you can. If it is almost time for your next dose, take only that dose. Do not take double or extra doses. What may interact with this medicine? Do not take this medicine with any of the following medications:  MAOIs like Carbex, Eldepryl, Marplan, Nardil, and Parnate This medicine may also interact with the following medications:  alcohol  barbiturates like phenobarbital  medicines for bladder spasm like oxybutynin, tolterodine  medicines for blood pressure  medicines for depression, anxiety, or psychotic disturbances  medicines for movement abnormalities or Parkinson's disease  medicines for sleep  other medicines for cold, cough, or allergy  some medicines for the stomach like chlordiazepoxide, dicyclomine This list may not describe all possible interactions. Give your health care provider a list of all the medicines, herbs, non-prescription drugs, or dietary supplements you use. Also tell them if you smoke, drink alcohol, or use illegal drugs. Some items may interact with your medicine. What should I watch for while using this medicine? Visit your doctor or health care professional for regular check ups. Tell your doctor or health care professional if your symptoms do not start to get better or if they get worse. If you are diabetic use a sugar-free form of this medicine. Your mouth may get dry. Chewing sugarless gum or sucking hard candy, and drinking plenty of water may help. Contact your doctor if the problem does not go away or is severe. This medicine may cause dry eyes and blurred vision. If you wear contact lenses you may feel some discomfort. Lubricating drops may help. See your eye doctor if the problem does not go away or is severe. You may get   drowsy or dizzy. Do not drive, use machinery, or do anything that needs mental alertness until you know how this medicine affects you. Do not stand or sit up quickly, especially if you are an older  patient. This reduces the risk of dizzy or fainting spells. Alcohol may interfere with the effect of this medicine. Avoid alcoholic drinks. What side effects may I notice from receiving this medicine? Side effects that you should report to your doctor or health care professional as soon as possible:  allergic reactions like skin rash, itching or hives, swelling of the face, lips, or tongue  changes in vision  confused, agitated, or nervous  fast, irregular heartbeat  tremor  trouble passing urine or change in the amount of urine  unusual bleeding or bruising  unusually weak or tired Side effects that usually do not require medical attention (report to your doctor or health care professional if they continue or are bothersome):  constipation, diarrhea  drowsy  headache  loss of appetite  stomach upset, vomiting  thick mucus This list may not describe all possible side effects. Call your doctor for medical advice about side effects. You may report side effects to FDA at 1-800-FDA-1088. Where should I keep my medicine? Keep out of the reach of children. This medicine can be abused. Keep your medicine in a safe place. Store at room temperature, between 15 and 30 degrees C (59 and 86 degrees F). Do not freeze. Protect from light and moisture. Keep container tightly closed. Throw away any unused medicine after the expiration date. NOTE: This sheet is a summary. It may not cover all possible information. If you have questions about this medicine, talk to your doctor, pharmacist, or health care provider.  2020 Elsevier/Gold Standard (2018-10-13 12:25:03)  

## 2019-08-24 NOTE — Progress Notes (Signed)
Acute Office Visit  Subjective:    Patient ID: Whitney Thornton, female    DOB: 1965-09-25, 54 y.o.   MRN: [id number]  Chief Complaint  Patient presents with  . Pain    in ribs  . Urinary Tract Infection  . no energy     HPI  Whitney Thornton is a 54 year old female who presents today for burning with urination and fatigue and cough.. Continue to have  Pain in her  Ribs.   Past Medical History:  Diagnosis Date  . Diabetes mellitus without complication (HCC)   . Nephritis     Past Surgical History:  Procedure Laterality Date  . ABDOMINAL HYSTERECTOMY    . CESAREAN SECTION    . CHOLECYSTECTOMY    . KNEE SURGERY Bilateral   . SINUS EXPLORATION     sinus surgery    History reviewed. No pertinent family history.  Social History   Socioeconomic History  . Marital status: Married    Spouse name: Not on file  . Number of children: Not on file  . Years of education: Not on file  . Highest education level: Not on file  Occupational History  . Not on file  Tobacco Use  . Smoking status: Never Smoker  . Smokeless tobacco: Never Used  Substance and Sexual Activity  . Alcohol use: Not Currently  . Drug use: Not Currently  . Sexual activity: Not on file  Other Topics Concern  . Not on file  Social History Narrative  . Not on file   Social Determinants of Health   Financial Resource Strain:   . Difficulty of Paying Living Expenses:   Food Insecurity:   . Worried About Programme researcher, broadcasting/film/video in the Last Year:   . Barista in the Last Year:   Transportation Needs:   . Freight forwarder (Medical):   Marland Kitchen Lack of Transportation (Non-Medical):   Physical Activity:   . Days of Exercise per Week:   . Minutes of Exercise per Session:   Stress:   . Feeling of Stress :   Social Connections:   . Frequency of Communication with Friends and Family:   . Frequency of Social Gatherings with Friends and Family:   . Attends Religious Services:   .  Active Member of Clubs or Organizations:   . Attends Banker Meetings:   Marland Kitchen Marital Status:   Intimate Partner Violence:   . Fear of Current or Ex-Partner:   . Emotionally Abused:   Marland Kitchen Physically Abused:   . Sexually Abused:     Outpatient Medications Prior to Visit  Medication Sig Dispense Refill  . amoxicillin-clavulanate (AUGMENTIN) 875-125 MG tablet Take 1 tablet by mouth 2 (two) times daily. 20 tablet 0  . aspirin EC 81 MG tablet Take 81 mg by mouth at bedtime.    . cyclobenzaprine (FLEXERIL) 10 MG tablet Take 1 tablet (10 mg total) by mouth 3 (three) times daily as needed for muscle spasms. 60 tablet 3  . diphenhydramine-acetaminophen (TYLENOL PM) 25-500 MG TABS tablet Take 1 tablet by mouth at bedtime as needed (sleep/pain).    Marland Kitchen ezetimibe (ZETIA) 10 MG tablet Take 1 tablet (10 mg total) by mouth daily. 90 tablet 1  . glipiZIDE (GLUCOTROL XL) 10 MG 24 hr tablet Take 1 tablet (10 mg total) by mouth 2 (two) times daily after a meal. 180 tablet 1  . Insulin Glargine (BASAGLAR KWIKPEN) 100 UNIT/ML Inject 0.15 mLs (15  Units total) into the skin at bedtime. 6 pen 1  . ketorolac (TORADOL) 10 MG tablet Take 1 tablet (10 mg total) by mouth every 6 (six) hours as needed. Maximum dose of 40 mg in a 24 hour period 20 tablet 0  . levothyroxine (SYNTHROID) 100 MCG tablet Take 1 tablet (100 mcg total) by mouth daily. 90 tablet 0  . metFORMIN (GLUCOPHAGE-XR) 500 MG 24 hr tablet Take 2 tablets (1,000 mg total) by mouth 2 (two) times daily with a meal. 180 tablet 1  . simvastatin (ZOCOR) 40 MG tablet Take 1 tablet (40 mg total) by mouth at bedtime. 90 tablet 1  . cephALEXin (KEFLEX) 500 MG capsule Take 1 capsule (500 mg total) by mouth 4 (four) times daily. 20 capsule 0  . fluconazole (DIFLUCAN) 150 MG tablet Take 1 tablet (150 mg total) by mouth daily. Take after your complete your antibiotic prescription. 1 tablet 0  . lisinopril (ZESTRIL) 2.5 MG tablet Take 2 tablets (5 mg total) by mouth  daily. 90 tablet 1  . phenazopyridine (PYRIDIUM) 100 MG tablet Take 1 tablet (100 mg total) by mouth 3 (three) times daily as needed for pain. 10 tablet 0   No facility-administered medications prior to visit.    Allergies  Allergen Reactions  . Celebrex [Celecoxib] Other (See Comments)    Patients reports weakness/ signs of stroke when she takes this.    Review of Systems  All other systems reviewed and are negative.      Objective:    Physical Exam Vitals reviewed.  HENT:     Head: Normocephalic.     Right Ear: Tympanic membrane normal.     Left Ear: Tympanic membrane normal.  Eyes:     Extraocular Movements: Extraocular movements intact.     Pupils: Pupils are equal, round, and reactive to light.  Cardiovascular:     Rate and Rhythm: Normal rate and regular rhythm.     Heart sounds: Normal heart sounds.  Pulmonary:     Effort: Pulmonary effort is normal.     Breath sounds: Normal breath sounds.  Abdominal:     General: Bowel sounds are normal.  Skin:    General: Skin is warm and dry.  Neurological:     Mental Status: She is alert and oriented to person, place, and time.  Psychiatric:        Mood and Affect: Mood normal.        Behavior: Behavior normal.        Thought Content: Thought content normal.        Judgment: Judgment normal.     BP 107/66 (BP Location: Left Arm, Patient Position: Sitting, Cuff Size: Normal)   Pulse 81   Temp 97.7 F (36.5 C) (Oral)   Ht 5\' 6"  (1.676 m)   Wt 184 lb 12.8 oz (83.8 kg)   SpO2 98%   BMI 29.83 kg/m  Wt Readings from Last 3 Encounters:  08/24/19 184 lb 12.8 oz (83.8 kg)  06/12/19 181 lb 6.4 oz (82.3 kg)  04/12/19 190 lb (86.2 kg)    Health Maintenance Due  Topic Date Due  . Hepatitis C Screening  Never done  . COVID-19 Vaccine (1) Never done  . INFLUENZA VACCINE  08/19/2019    There are no preventive care reminders to display for this patient.   Lab Results  Component Value Date   TSH 0.238 (L)  07/30/2019   Lab Results  Component Value Date   WBC 8.5 07/13/2019  HGB 14.2 07/13/2019   HCT 44.0 07/13/2019   MCV 89.8 07/13/2019   PLT 266 07/13/2019   Lab Results  Component Value Date   NA 141 07/30/2019   K 4.8 07/30/2019   CO2 21 07/30/2019   GLUCOSE 147 (H) 07/30/2019   BUN 18 07/30/2019   CREATININE 0.66 07/30/2019   BILITOT 0.3 07/30/2019   ALKPHOS 113 07/30/2019   AST 29 07/30/2019   ALT 50 (H) 07/30/2019   PROT 6.1 07/30/2019   ALBUMIN 4.2 07/30/2019   CALCIUM 9.5 07/30/2019   ANIONGAP 13 07/13/2019   Lab Results  Component Value Date   CHOL 238 (H) 03/15/2019   Lab Results  Component Value Date   HDL 37 (L) 03/15/2019   Lab Results  Component Value Date   LDLCALC 110 (H) 03/15/2019   Lab Results  Component Value Date   TRIG 525 (H) 03/15/2019   Lab Results  Component Value Date   CHOLHDL 6.4 (H) 03/15/2019   Lab Results  Component Value Date   HGBA1C 7.5 (A) 06/12/2019       Assessment & Plan:  Whitney Thornton was seen today for pain, urinary tract infection and no energy .  Diagnoses and all orders for this visit:  Dysuria -     POCT URINALYSIS DIP (CLINITEK)  Persistent dry cough Stop lisinopril for cough a side effect medication  was for renal protection   Acute cystitis without hematuria -     Urine Culture -     phenazopyridine (PYRIDIUM) 100 MG tablet; Take 1 tablet (100 mg total) by mouth 3 (three) times daily as needed for pain.  Other orders -     albuterol (VENTOLIN HFA) 108 (90 Base) MCG/ACT inhaler; Inhale 1 puff into the lungs every 6 (six) hours as needed for wheezing or shortness of breath.      Meds ordered this encounter  Medications  . albuterol (VENTOLIN HFA) 108 (90 Base) MCG/ACT inhaler    Sig: Inhale 1 puff into the lungs every 6 (six) hours as needed for wheezing or shortness of breath.    Dispense:  18 g    Refill:  1  . phenazopyridine (PYRIDIUM) 100 MG tablet    Sig: Take 1 tablet (100 mg total) by  mouth 3 (three) times daily as needed for pain.    Dispense:  10 tablet    Refill:  0     Grayce Sessions, NP

## 2019-08-27 ENCOUNTER — Encounter (INDEPENDENT_AMBULATORY_CARE_PROVIDER_SITE_OTHER): Payer: Self-pay | Admitting: Primary Care

## 2019-08-27 LAB — URINE CULTURE

## 2019-08-28 ENCOUNTER — Other Ambulatory Visit (INDEPENDENT_AMBULATORY_CARE_PROVIDER_SITE_OTHER): Payer: Self-pay | Admitting: Primary Care

## 2019-08-28 DIAGNOSIS — N390 Urinary tract infection, site not specified: Secondary | ICD-10-CM

## 2019-08-28 MED ORDER — FLUCONAZOLE 150 MG PO TABS: 150.0000 mg | ORAL_TABLET | Freq: Every day | ORAL | 0 refills | Status: DC

## 2019-08-28 MED ORDER — CIPROFLOXACIN HCL 500 MG PO TABS: 500.0000 mg | ORAL_TABLET | Freq: Two times a day (BID) | ORAL | 0 refills | Status: DC

## 2019-08-29 ENCOUNTER — Telehealth (INDEPENDENT_AMBULATORY_CARE_PROVIDER_SITE_OTHER): Payer: Self-pay | Admitting: Primary Care

## 2019-08-29 ENCOUNTER — Other Ambulatory Visit (INDEPENDENT_AMBULATORY_CARE_PROVIDER_SITE_OTHER): Payer: Self-pay | Admitting: Primary Care

## 2019-08-29 DIAGNOSIS — N390 Urinary tract infection, site not specified: Secondary | ICD-10-CM

## 2019-08-29 MED ORDER — CIPROFLOXACIN HCL 500 MG PO TABS: 500.0000 mg | ORAL_TABLET | Freq: Two times a day (BID) | ORAL | 0 refills | Status: DC

## 2019-08-29 NOTE — Telephone Encounter (Signed)
Patient calling about this medication. She inquired if 3 tablets was the correct amount.    ciprofloxacin (CIPRO) 500 MG tablet [

## 2019-09-07 ENCOUNTER — Ambulatory Visit (INDEPENDENT_AMBULATORY_CARE_PROVIDER_SITE_OTHER): Payer: Managed Care, Other (non HMO) | Admitting: Surgical

## 2019-09-07 DIAGNOSIS — M1712 Unilateral primary osteoarthritis, left knee: Secondary | ICD-10-CM | POA: Diagnosis not present

## 2019-09-07 DIAGNOSIS — M1711 Unilateral primary osteoarthritis, right knee: Secondary | ICD-10-CM

## 2019-09-14 ENCOUNTER — Ambulatory Visit (INDEPENDENT_AMBULATORY_CARE_PROVIDER_SITE_OTHER): Payer: Managed Care, Other (non HMO) | Admitting: Surgical

## 2019-09-14 ENCOUNTER — Encounter (INDEPENDENT_AMBULATORY_CARE_PROVIDER_SITE_OTHER): Payer: Self-pay | Admitting: Primary Care

## 2019-09-14 ENCOUNTER — Ambulatory Visit (INDEPENDENT_AMBULATORY_CARE_PROVIDER_SITE_OTHER): Payer: Managed Care, Other (non HMO) | Admitting: Primary Care

## 2019-09-14 ENCOUNTER — Other Ambulatory Visit: Payer: Self-pay

## 2019-09-14 ENCOUNTER — Encounter: Payer: Self-pay | Admitting: Surgical

## 2019-09-14 VITALS — BP 124/81 | HR 89 | Wt 185.2 lb

## 2019-09-14 DIAGNOSIS — R05 Cough: Secondary | ICD-10-CM

## 2019-09-14 DIAGNOSIS — N390 Urinary tract infection, site not specified: Secondary | ICD-10-CM

## 2019-09-14 DIAGNOSIS — M1711 Unilateral primary osteoarthritis, right knee: Secondary | ICD-10-CM

## 2019-09-14 DIAGNOSIS — R053 Chronic cough: Secondary | ICD-10-CM

## 2019-09-14 DIAGNOSIS — M1712 Unilateral primary osteoarthritis, left knee: Secondary | ICD-10-CM

## 2019-09-14 DIAGNOSIS — M170 Bilateral primary osteoarthritis of knee: Secondary | ICD-10-CM

## 2019-09-14 LAB — POCT URINALYSIS DIP (CLINITEK)
Bilirubin, UA: NEGATIVE
Blood, UA: NEGATIVE
Glucose, UA: 500 mg/dL — AB
Ketones, POC UA: NEGATIVE mg/dL
Leukocytes, UA: NEGATIVE
Nitrite, UA: NEGATIVE
POC PROTEIN,UA: NEGATIVE
Spec Grav, UA: >=1.03 — AB (ref 1.010–1.025)
Urobilinogen, UA: 2 E.U./dL — AB
pH, UA: 6 (ref 5.0–8.0)

## 2019-09-14 LAB — GLUCOSE, POCT (MANUAL RESULT ENTRY): POC Glucose: 263 mg/dl — AB (ref 70–99)

## 2019-09-14 MED ORDER — LIDOCAINE HCL 1 % IJ SOLN
5.0000 mL | INTRAMUSCULAR | Status: AC | PRN
  Administered 2019-09-14: 5 mL

## 2019-09-14 NOTE — Progress Notes (Signed)
Acute Office Visit  Subjective:    Patient ID: Whitney Thornton, female    DOB: March 21, 1965, 54 y.o.   MRN: [id number]  Chief Complaint  Patient presents with   Urinary Tract Infection   Cough    HPI Whitney Thornton is a 54 year old female in today with symptoms of low back pain , frequent urination , thirst and nausea. Negative for UTI.  Past Medical History:  Diagnosis Date   Diabetes mellitus without complication (HCC)    Nephritis     Past Surgical History:  Procedure Laterality Date   ABDOMINAL HYSTERECTOMY     CESAREAN SECTION     CHOLECYSTECTOMY     KNEE SURGERY Bilateral    SINUS EXPLORATION     sinus surgery    No family history on file.  Social History   Socioeconomic History   Marital status: Married    Spouse name: Not on file   Number of children: Not on file   Years of education: Not on file   Highest education level: Not on file  Occupational History   Not on file  Tobacco Use   Smoking status: Never Smoker   Smokeless tobacco: Never Used  Substance and Sexual Activity   Alcohol use: Not Currently   Drug use: Not Currently   Sexual activity: Not on file  Other Topics Concern   Not on file  Social History Narrative   Not on file   Social Determinants of Health   Financial Resource Strain:    Difficulty of Paying Living Expenses: Not on file  Food Insecurity:    Worried About Running Out of Food in the Last Year: Not on file   Ran Out of Food in the Last Year: Not on file  Transportation Needs:    Lack of Transportation (Medical): Not on file   Lack of Transportation (Non-Medical): Not on file  Physical Activity:    Days of Exercise per Week: Not on file   Minutes of Exercise per Session: Not on file  Stress:    Feeling of Stress : Not on file  Social Connections:    Frequency of Communication with Friends and Family: Not on file   Frequency of Social Gatherings with Friends  and Family: Not on file   Attends Religious Services: Not on file   Active Member of Clubs or Organizations: Not on file   Attends Banker Meetings: Not on file   Marital Status: Not on file  Intimate Partner Violence:    Fear of Current or Ex-Partner: Not on file   Emotionally Abused: Not on file   Physically Abused: Not on file   Sexually Abused: Not on file    Outpatient Medications Prior to Visit  Medication Sig Dispense Refill   albuterol (VENTOLIN HFA) 108 (90 Base) MCG/ACT inhaler Inhale 1 puff into the lungs every 6 (six) hours as needed for wheezing or shortness of breath. 18 g 1   aspirin EC 81 MG tablet Take 81 mg by mouth at bedtime.     cyclobenzaprine (FLEXERIL) 10 MG tablet Take 1 tablet (10 mg total) by mouth 3 (three) times daily as needed for muscle spasms. 60 tablet 3   diphenhydramine-acetaminophen (TYLENOL PM) 25-500 MG TABS tablet Take 1 tablet by mouth at bedtime as needed (sleep/pain).     ezetimibe (ZETIA) 10 MG tablet Take 1 tablet (10 mg total) by mouth daily. 90 tablet 1   glipiZIDE (GLUCOTROL XL) 10 MG  24 hr tablet Take 1 tablet (10 mg total) by mouth 2 (two) times daily after a meal. 180 tablet 1   Insulin Glargine (BASAGLAR KWIKPEN) 100 UNIT/ML Inject 0.15 mLs (15 Units total) into the skin at bedtime. 6 pen 1   levothyroxine (SYNTHROID) 100 MCG tablet Take 1 tablet (100 mcg total) by mouth daily. 90 tablet 0   metFORMIN (GLUCOPHAGE-XR) 500 MG 24 hr tablet Take 2 tablets (1,000 mg total) by mouth 2 (two) times daily with a meal. 180 tablet 1   simvastatin (ZOCOR) 40 MG tablet Take 1 tablet (40 mg total) by mouth at bedtime. 90 tablet 1   amoxicillin-clavulanate (AUGMENTIN) 875-125 MG tablet Take 1 tablet by mouth 2 (two) times daily. (Patient not taking: Reported on 09/14/2019) 20 tablet 0   ciprofloxacin (CIPRO) 500 MG tablet Take 1 tablet (500 mg total) by mouth 2 (two) times daily. (Patient not taking: Reported on 09/14/2019) 6  tablet 0   fluconazole (DIFLUCAN) 150 MG tablet Take 1 tablet (150 mg total) by mouth daily. Take after your complete your antibiotic prescription. (Patient not taking: Reported on 09/14/2019) 1 tablet 0   ketorolac (TORADOL) 10 MG tablet Take 1 tablet (10 mg total) by mouth every 6 (six) hours as needed. Maximum dose of 40 mg in a 24 hour period (Patient not taking: Reported on 09/14/2019) 20 tablet 0   phenazopyridine (PYRIDIUM) 100 MG tablet Take 1 tablet (100 mg total) by mouth 3 (three) times daily as needed for pain. (Patient not taking: Reported on 09/14/2019) 10 tablet 0   No facility-administered medications prior to visit.    Allergies  Allergen Reactions   Celebrex [Celecoxib] Other (See Comments)    Patients reports weakness/ signs of stroke when she takes this.    Review of Systems  Constitutional: Positive for fatigue.  Endocrine: Positive for polydipsia and polyuria.  Genitourinary: Positive for difficulty urinating, dysuria, flank pain and frequency.  All other systems reviewed and are negative.      Objective:    Physical Exam Vitals reviewed.  Constitutional:      Appearance: She is obese.  HENT:     Head: Normocephalic.  Cardiovascular:     Rate and Rhythm: Normal rate and regular rhythm.  Pulmonary:     Effort: Pulmonary effort is normal.     Breath sounds: Normal breath sounds.  Abdominal:     General: Bowel sounds are normal.  Musculoskeletal:        General: Normal range of motion.     Cervical back: Normal range of motion.  Neurological:     Mental Status: She is alert and oriented to person, place, and time.  Psychiatric:        Mood and Affect: Mood normal.        Behavior: Behavior normal.        Thought Content: Thought content normal.        Judgment: Judgment normal.     BP 124/81    Pulse 89    Wt 185 lb 3.2 oz (84 kg)    SpO2 97%    BMI 29.89 kg/m  Wt Readings from Last 3 Encounters:  09/14/19 185 lb 3.2 oz (84 kg)  08/24/19 184  lb 12.8 oz (83.8 kg)  06/12/19 181 lb 6.4 oz (82.3 kg)    Health Maintenance Due  Topic Date Due   Hepatitis C Screening  Never done   COVID-19 Vaccine (1) Never done   INFLUENZA VACCINE  08/19/2019  There are no preventive care reminders to display for this patient.   Lab Results  Component Value Date   TSH 0.238 (L) 07/30/2019   Lab Results  Component Value Date   WBC 8.5 07/13/2019   HGB 14.2 07/13/2019   HCT 44.0 07/13/2019   MCV 89.8 07/13/2019   PLT 266 07/13/2019   Lab Results  Component Value Date   NA 141 07/30/2019   K 4.8 07/30/2019   CO2 21 07/30/2019   GLUCOSE 147 (H) 07/30/2019   BUN 18 07/30/2019   CREATININE 0.66 07/30/2019   BILITOT 0.3 07/30/2019   ALKPHOS 113 07/30/2019   AST 29 07/30/2019   ALT 50 (H) 07/30/2019   PROT 6.1 07/30/2019   ALBUMIN 4.2 07/30/2019   CALCIUM 9.5 07/30/2019   ANIONGAP 13 07/13/2019   Lab Results  Component Value Date   CHOL 238 (H) 03/15/2019   Lab Results  Component Value Date   HDL 37 (L) 03/15/2019   Lab Results  Component Value Date   LDLCALC 110 (H) 03/15/2019   Lab Results  Component Value Date   TRIG 525 (H) 03/15/2019   Lab Results  Component Value Date   CHOLHDL 6.4 (H) 03/15/2019   Lab Results  Component Value Date   HGBA1C 7.5 (A) 06/12/2019       Assessment & Plan:  Johniece was seen today for urinary tract infection and cough.  Diagnoses and all orders for this visit:  Urinary tract infection without hematuria, site unspecified Frequent urinary symptoms - urine today glucose >  500, negative for ketones and Specfic gravity ./= 1.030 Feels bladder is never completely emptied. Bilateral CVA tenderness right> left. -     POCT URINALYSIS DIP (CLINITEK) -     POCT glucose (manual entry) -     Ambulatory referral to Urology  Persistent dry cough Continue after discontinuing ACE inhibitor. No fever or chills .Causing difficulty to sleep.    No orders of the defined types were  placed in this encounter.    Grayce Sessions, NP

## 2019-09-14 NOTE — Progress Notes (Signed)
° °  Procedure Note  Patient: Whitney Thornton             Date of Birth: Apr 02, 1965           MRN: [id number]             Visit Date: 09/14/2019  Procedures: Visit Diagnoses:  1. Primary osteoarthritis of both knees     Large Joint Inj: bilateral knee on 09/14/2019 5:09 PM Indications: diagnostic evaluation, joint swelling and pain Details: 18 G 1.5 in needle, superolateral approach  Arthrogram: No  Medications (Right): 5 mL lidocaine 1 % (10 cc of dextrose in separate syringe) Medications (Left): 5 mL lidocaine 1 % (10 cc of dextrose in separate syringe) Outcome: tolerated well, no immediate complications Procedure, treatment alternatives, risks and benefits explained, specific risks discussed. Consent was given by the patient. Immediately prior to procedure a time out was called to verify the correct patient, procedure, equipment, support staff and site/side marked as required. Patient was prepped and draped in the usual sterile fashion.

## 2019-09-14 NOTE — Patient Instructions (Signed)

## 2019-09-16 ENCOUNTER — Other Ambulatory Visit (INDEPENDENT_AMBULATORY_CARE_PROVIDER_SITE_OTHER): Payer: Self-pay | Admitting: Primary Care

## 2019-09-16 ENCOUNTER — Encounter: Payer: Self-pay | Admitting: Surgical

## 2019-09-16 DIAGNOSIS — M1711 Unilateral primary osteoarthritis, right knee: Secondary | ICD-10-CM

## 2019-09-16 DIAGNOSIS — N3000 Acute cystitis without hematuria: Secondary | ICD-10-CM

## 2019-09-16 MED ORDER — BUPIVACAINE HCL 0.25 % IJ SOLN
4.0000 mL | INTRAMUSCULAR | Status: AC | PRN
  Administered 2019-09-16: 4 mL via INTRA_ARTICULAR

## 2019-09-16 MED ORDER — METHYLPREDNISOLONE ACETATE 40 MG/ML IJ SUSP
40.0000 mg | INTRAMUSCULAR | Status: AC | PRN
Start: 2019-09-16 — End: 2019-09-16
  Administered 2019-09-16: 40 mg via INTRA_ARTICULAR

## 2019-09-16 MED ORDER — LIDOCAINE HCL 1 % IJ SOLN
5.0000 mL | INTRAMUSCULAR | Status: AC | PRN
  Administered 2019-09-16: 5 mL

## 2019-09-16 MED ORDER — PHENAZOPYRIDINE HCL 100 MG PO TABS: 100.0000 mg | ORAL_TABLET | Freq: Three times a day (TID) | ORAL | 0 refills | Status: DC | PRN

## 2019-09-16 NOTE — Progress Notes (Signed)
Office Visit Note   Patient: Whitney Thornton           Date of Birth: December 04, 1965           MRN: [id number] Visit Date: 09/07/2019 Requested by: Grayce Sessions, NP [address] [place],  [place] [address] PCP: Grayce Sessions, NP  Subjective: Chief Complaint  Patient presents with  . Left Knee - Pain  . Right Knee - Pain    HPI: Whitney Thornton is a 54 y.o. female who presents to the office complaining of bilateral knee pain.  Patient works as a Investment banker, operational.  She complains of right greater than left pain.  60% of the pain is in her right knee.  She localizes pain to the medial aspect of bilateral knees.  Notes grinding and cracking symptoms.  Denies any locking but states that her knees give out on her and wake her with pain at times.  Pain is worse with rain.  She had cortisone and gel injections that provided no relief and in fact made her pain worse.  She is not taking any medications for pain.  She tried taking 800 mg ibuprofen and Tylenol but this has lost its effectiveness.  She denies any radicular pain, numbness/tingling.  She does note some low back pain for several weeks but this seems to be improving.  Denies any groin pain..                ROS: All systems reviewed are negative as they relate to the chief complaint within the history of present illness.  Patient denies fevers or chills.  Assessment & Plan: Visit Diagnoses:  1. Unilateral primary osteoarthritis, left knee   2. Unilateral primary osteoarthritis, right knee     Plan: Patient is a 54 year old female presents complaining of bilateral knee pain.  Most of the pain is in her right knee.  She has had no significant relief with cortisone or gel injections.  No sign that she has any groin pain or pain originating from her hip joint.  She does have some mild low back pain but this seems to be improving.  No signs that this knee pain is radicular in nature.  However she has had no  significant relief from injections so far.  Plan to try dextrose injection in the right knee to see if this will provide relief.  If no improvement from that, may have to consider working up her back as a source of her knee pain.  Patient understands and agrees with plan.  Tolerated the dextrose injection well.  Follow-up in 1 week for second dextrose injection in a series of 3 injections.  May opt for left knee injection with x-rays at that time as well if the right knee provides relief.  Follow-Up Instructions: No follow-ups on file.   Orders:  No orders of the defined types were placed in this encounter.  No orders of the defined types were placed in this encounter.     Procedures: Large Joint Inj: R knee on 09/16/2019 3:40 PM Indications: diagnostic evaluation, joint swelling and pain Details: 18 G 1.5 in needle, superolateral approach  Arthrogram: No  Medications: 5 mL lidocaine 1 %; 40 mg methylPREDNISolone acetate 40 MG/ML; 4 mL bupivacaine 0.25 % (With 10 cc of dextrose) Outcome: tolerated well, no immediate complications Procedure, treatment alternatives, risks and benefits explained, specific risks discussed. Consent was given by the patient. Immediately prior to procedure a time out was called to verify  the correct patient, procedure, equipment, support staff and site/side marked as required. Patient was prepped and draped in the usual sterile fashion.       Clinical Data: No additional findings.  Objective: Vital Signs: There were no vitals taken for this visit.  Physical Exam:  Constitutional: Patient appears well-developed HEENT:  Head: Normocephalic Eyes:EOM are normal Neck: Normal range of motion Cardiovascular: Normal rate Pulmonary/chest: Effort normal Neurologic: Patient is alert Skin: Skin is warm Psychiatric: Patient has normal mood and affect  Ortho Exam: Ortho exam demonstrates bilateral knees with intact extensor mechanisms.  No effusions noted.  No  calf tenderness noted.  Tenderness over the medial joint line bilaterally.  Mild tenderness over the pes anserine bursa bilaterally.  No tenderness elsewhere throughout the knee.  Left knee with incision from prior knee surgery.  No pain with hip range of motion.  Negative straight leg raise.  Specialty Comments:  No specialty comments available.  Imaging: No results found.   PMFS History: There are no problems to display for this patient.  Past Medical History:  Diagnosis Date  . Diabetes mellitus without complication (HCC)   . Nephritis     No family history on file.  Past Surgical History:  Procedure Laterality Date  . ABDOMINAL HYSTERECTOMY    . CESAREAN SECTION    . CHOLECYSTECTOMY    . KNEE SURGERY Bilateral   . SINUS EXPLORATION     sinus surgery   Social History   Occupational History  . Not on file  Tobacco Use  . Smoking status: Never Smoker  . Smokeless tobacco: Never Used  Substance and Sexual Activity  . Alcohol use: Not Currently  . Drug use: Not Currently  . Sexual activity: Not on file

## 2019-09-18 ENCOUNTER — Telehealth: Payer: Self-pay | Admitting: Orthopedic Surgery

## 2019-09-18 NOTE — Telephone Encounter (Signed)
Holding for you. ? Work in on [facility] schedule Friday?

## 2019-09-18 NOTE — Telephone Encounter (Signed)
Patient called.   Said she was told to come in the next two fridays to finish her series of injections. I told her I would get back in touch with her after I speak to her providers because there is no availability for these appointments at the moment.   Call back: [phone]

## 2019-09-19 NOTE — Telephone Encounter (Signed)
Scheduled

## 2019-09-21 ENCOUNTER — Encounter: Payer: Self-pay | Admitting: Surgical

## 2019-09-21 ENCOUNTER — Ambulatory Visit (INDEPENDENT_AMBULATORY_CARE_PROVIDER_SITE_OTHER): Payer: Managed Care, Other (non HMO) | Admitting: Surgical

## 2019-09-21 VITALS — Ht 66.0 in | Wt 185.0 lb

## 2019-09-21 DIAGNOSIS — M170 Bilateral primary osteoarthritis of knee: Secondary | ICD-10-CM

## 2019-09-21 MED ORDER — LIDOCAINE HCL 1 % IJ SOLN
5.0000 mL | INTRAMUSCULAR | Status: AC | PRN
  Administered 2019-09-21: 5 mL

## 2019-09-21 NOTE — Progress Notes (Signed)
   Procedure Note  Patient: Whitney Thornton             Date of Birth: [birth year]           MRN: [id number]             Visit Date: 09/21/2019  Procedures: Visit Diagnoses:  1. Primary osteoarthritis of both knees     Large Joint Inj: bilateral knee on 09/21/2019 5:22 PM Indications: diagnostic evaluation, joint swelling and pain Details: 18 G 1.5 in needle, superolateral approach  Arthrogram: No  Medications (Right): 5 mL lidocaine 1 % (Separate injection of 6 cc of lidocaine mixed with 4 cc of dextrose) Medications (Left): 5 mL lidocaine 1 % (Separate injection of 6 cc lidocaine and 4 cc of dextrose) Outcome: tolerated well, no immediate complications Procedure, treatment alternatives, risks and benefits explained, specific risks discussed. Consent was given by the patient. Immediately prior to procedure a time out was called to verify the correct patient, procedure, equipment, support staff and site/side marked as required. Patient was prepped and draped in the usual sterile fashion.

## 2019-09-25 ENCOUNTER — Ambulatory Visit (INDEPENDENT_AMBULATORY_CARE_PROVIDER_SITE_OTHER): Payer: Managed Care, Other (non HMO) | Admitting: Primary Care

## 2019-09-26 ENCOUNTER — Encounter (INDEPENDENT_AMBULATORY_CARE_PROVIDER_SITE_OTHER): Payer: Self-pay

## 2019-09-26 ENCOUNTER — Other Ambulatory Visit: Payer: Self-pay

## 2019-09-26 ENCOUNTER — Encounter (INDEPENDENT_AMBULATORY_CARE_PROVIDER_SITE_OTHER): Payer: Managed Care, Other (non HMO) | Admitting: Primary Care

## 2019-09-28 ENCOUNTER — Ambulatory Visit: Payer: Self-pay

## 2019-09-28 ENCOUNTER — Ambulatory Visit (INDEPENDENT_AMBULATORY_CARE_PROVIDER_SITE_OTHER): Payer: Managed Care, Other (non HMO) | Admitting: Surgical

## 2019-09-28 DIAGNOSIS — M87876 Other osteonecrosis, unspecified foot: Secondary | ICD-10-CM | POA: Diagnosis not present

## 2019-09-28 DIAGNOSIS — M2022 Hallux rigidus, left foot: Secondary | ICD-10-CM | POA: Diagnosis not present

## 2019-09-28 DIAGNOSIS — M1712 Unilateral primary osteoarthritis, left knee: Secondary | ICD-10-CM | POA: Diagnosis not present

## 2019-09-28 DIAGNOSIS — M79672 Pain in left foot: Secondary | ICD-10-CM | POA: Diagnosis not present

## 2019-10-01 ENCOUNTER — Telehealth: Payer: Self-pay

## 2019-10-01 NOTE — Telephone Encounter (Signed)
Patient requesting 90 supply of meloxicam 15mg  sent to [facility].

## 2019-10-02 ENCOUNTER — Other Ambulatory Visit: Payer: Self-pay | Admitting: Surgical

## 2019-10-02 MED ORDER — MELOXICAM 15 MG PO TABS: 15.0000 mg | ORAL_TABLET | Freq: Every day | ORAL | 0 refills | Status: DC

## 2019-10-02 NOTE — Telephone Encounter (Signed)
[facility]

## 2019-10-03 ENCOUNTER — Encounter (INDEPENDENT_AMBULATORY_CARE_PROVIDER_SITE_OTHER): Payer: Self-pay | Admitting: Primary Care

## 2019-10-03 ENCOUNTER — Other Ambulatory Visit (INDEPENDENT_AMBULATORY_CARE_PROVIDER_SITE_OTHER): Payer: Self-pay | Admitting: Primary Care

## 2019-10-03 DIAGNOSIS — N3000 Acute cystitis without hematuria: Secondary | ICD-10-CM

## 2019-10-03 MED ORDER — PHENAZOPYRIDINE HCL 100 MG PO TABS: 100.0000 mg | ORAL_TABLET | Freq: Three times a day (TID) | ORAL | 0 refills | Status: DC | PRN

## 2019-10-11 ENCOUNTER — Other Ambulatory Visit (INDEPENDENT_AMBULATORY_CARE_PROVIDER_SITE_OTHER): Payer: Self-pay | Admitting: Primary Care

## 2019-10-11 ENCOUNTER — Encounter (INDEPENDENT_AMBULATORY_CARE_PROVIDER_SITE_OTHER): Payer: Self-pay | Admitting: Primary Care

## 2019-10-11 DIAGNOSIS — E119 Type 2 diabetes mellitus without complications: Secondary | ICD-10-CM

## 2019-10-11 NOTE — Telephone Encounter (Signed)
Requested Prescriptions  Pending Prescriptions Disp Refills   metFORMIN (GLUCOPHAGE-XR) 500 MG 24 hr tablet [Pharmacy Med Name: METFORMIN HCL ER TABS 500MG] 180 tablet 7    Sig: TAKE 2 TABLETS TWICE A DAY WITH A MEAL     Endocrinology:  Diabetes - Biguanides Passed - 10/11/2019  2:31 PM      Passed - Cr in normal range and within 360 days    Creatinine, Ser  Date Value Ref Range Status  07/30/2019 0.66 0.57 - 1.00 mg/dL Final         Passed - HBA1C is between 0 and 7.9 and within 180 days    Hemoglobin A1C  Date Value Ref Range Status  06/12/2019 7.5 (A) 4.0 - 5.6 % Final         Passed - eGFR in normal range and within 360 days    GFR calc Af Amer  Date Value Ref Range Status  07/30/2019 117 >59 mL/min/1.73 Final    Comment:    **[facility] currently reports eGFR in compliance with the current**   recommendations of the [employer]. [facility] will   update reporting as new guidelines are published from the NKF-ASN   Task force.    GFR calc non Af Amer  Date Value Ref Range Status  07/30/2019 101 >59 mL/min/1.73 Final         Passed - Valid encounter within last 6 months    Recent Outpatient Visits          3 weeks ago Urinary tract infection without hematuria, site unspecified   [facility] Kerin Perna, NP   1 month ago Dysuria   [place], Michelle P, NP   3 months ago Dysuria   [place]   4 months ago Type 2 diabetes mellitus without complication, without long-term current use of insulin ([place])   [place] [facility] Juluis Mire P, NP   6 months ago Type 2 diabetes mellitus without complication, without long-term current use of insulin ([place])   [place], [place], NP      Future Appointments            In 3 weeks Marlou Sa, Tonna [place], MD [facility]

## 2019-10-12 ENCOUNTER — Encounter (INDEPENDENT_AMBULATORY_CARE_PROVIDER_SITE_OTHER): Payer: Self-pay | Admitting: Primary Care

## 2019-10-12 DIAGNOSIS — N390 Urinary tract infection, site not specified: Secondary | ICD-10-CM

## 2019-10-15 ENCOUNTER — Encounter: Payer: Self-pay | Admitting: Surgical

## 2019-10-15 DIAGNOSIS — M1712 Unilateral primary osteoarthritis, left knee: Secondary | ICD-10-CM | POA: Diagnosis not present

## 2019-10-15 MED ORDER — LIDOCAINE HCL 1 % IJ SOLN
5.0000 mL | INTRAMUSCULAR | Status: AC | PRN
  Administered 2019-10-15: 5 mL

## 2019-10-15 NOTE — Progress Notes (Signed)
Office Visit Note   Patient: Whitney Thornton           Date of Birth: 13-Nov-1965           MRN: [id number] Visit Date: 09/28/2019 Requested by: Grayce Sessions, NP [address] [place],  [place] [address] PCP: Grayce Sessions, NP  Subjective: Chief Complaint  Patient presents with  . Left Knee - Follow-up  . Right Knee - Follow-up    HPI: Whitney Thornton is a 54 y.o. female who presents to the office complaining of bilateral knee pain and left foot pain.  She returns for final left knee dextrose injection in series of 3 injections.  She also comes in to discuss her left foot pain.  She notes pain along the medial and dorsal aspects of the left midfoot as well as pain in her great toe.  She denies any history of injury.  She notes that pain bothers her when she walks but denies any significant difficulty with weightbearing.  She denies any history of surgery to the left foot.  Denies any numbness/tingling.  Denies any ankle pain.  She has been taking meloxicam with some relief of her pain.  She has not tried any orthotics or topicals..                ROS: All systems reviewed are negative as they relate to the chief complaint within the history of present illness.  Patient denies fevers or chills.  Assessment & Plan: Visit Diagnoses:  1. Os naviculare pedis malacia (HCC)   2. Pain in left foot   3. Hallux rigidus, left foot     Plan: Patient is a 54 year old female for left knee dextrose injection and evaluation of left foot pain.  She has had left foot pain for several weeks and feels worsening.  Pain is primarily over the midfoot and great toe.  Radiographs reveal mild degenerative changes of the first MTP joint with mild joint space narrowing and small spur formation.  Also present on radiographs are an os peroneum and os navicularis.  She denies any significant pain laterally but she does have some medial sided pain that is worse with single-leg heel  raise that could be a result of symptomatic os navicularis.  Additionally with the mild degeneration of the first MTP joint, she has decreased range of motion as well on exam slightly compared with contralateral side.  She has pain with range of motion of the first MTP joint passively.  Discussed options available to patient.  Plan for patient to continue with meloxicam as well as using topical Voltaren.  Recommended she apply this topical Voltaren to the areas where she is most symptomatic such as the medial midfoot and her first toe.  Also recommend she obtain a over-the-counter arch support which may help decrease the pain associated with the os navicularis and possible inflammation in the posterior tibialis tendon.  Follow-up in 5 weeks for clinical recheck with Dr. August Saucer.  Follow-Up Instructions: No follow-ups on file.   Orders:  Orders Placed This Encounter  Procedures  . XR Foot Complete Left   No orders of the defined types were placed in this encounter.     Procedures: Large Joint Inj: L knee on 10/15/2019 3:22 PM Indications: diagnostic evaluation, joint swelling and pain Details: 18 G 1.5 in needle, superolateral approach  Arthrogram: No  Medications: 5 mL lidocaine 1 % (Separate injection of 4cc dextrose and 6cc Lidocaine) Outcome: tolerated well, no  immediate complications Procedure, treatment alternatives, risks and benefits explained, specific risks discussed. Consent was given by the patient. Immediately prior to procedure a time out was called to verify the correct patient, procedure, equipment, support staff and site/side marked as required. Patient was prepped and draped in the usual sterile fashion.       Clinical Data: No additional findings.  Objective: Vital Signs: There were no vitals taken for this visit.  Physical Exam:  Constitutional: Patient appears well-developed HEENT:  Head: Normocephalic Eyes:EOM are normal Neck: Normal range of  motion Cardiovascular: Normal rate Pulmonary/chest: Effort normal Neurologic: Patient is alert Skin: Skin is warm Psychiatric: Patient has normal mood and affect  Ortho Exam: Ortho exam demonstrates left knee without effusion.  No pain with passive range of motion of the left knee.  No pain in the groin elicited with hip range of motion.  Left knee extensor mechanism intact.  No ligamentous laxity to the left knee.  Left foot with 2+ DP pulse.  Sensation intact throughout the left foot.  Active dorsiflexion/plantarflexion/inversion/eversion intact.  Tenderness over the first MTP joint of the great toe with decreased range of motion compared with the contralateral toe.  Range of motion testing yields pain of the first MTP joint.  She does have some tenderness over the posterior tib tendon course.  No flatfoot deformity noted.  Pain elicited with single-leg heel raise of the left foot.  No pain with single-leg heel raise of the right foot.  Mild tenderness throughout the dorsal midfoot.  No tenderness over the plantar fascia.  No tenderness over the fifth metatarsal, lateral malleolus, medial malleolus, ATFL, CFL, retrocalcaneal space, Achilles tendon insertion, calcaneus.  Syndesmosis intact.  Specialty Comments:  No specialty comments available.  Imaging: No results found.   PMFS History: There are no problems to display for this patient.  Past Medical History:  Diagnosis Date  . Diabetes mellitus without complication (HCC)   . Nephritis     No family history on file.  Past Surgical History:  Procedure Laterality Date  . ABDOMINAL HYSTERECTOMY    . CESAREAN SECTION    . CHOLECYSTECTOMY    . KNEE SURGERY Bilateral   . SINUS EXPLORATION     sinus surgery   Social History   Occupational History  . Not on file  Tobacco Use  . Smoking status: Never Smoker  . Smokeless tobacco: Never Used  Substance and Sexual Activity  . Alcohol use: Not Currently  . Drug use: Not Currently  .  Sexual activity: Not on file

## 2019-10-16 NOTE — Addendum Note (Signed)
Addended by: Grayce Sessions on: 10/16/2019 03:31 PM   Modules accepted: Level of Service

## 2019-11-02 ENCOUNTER — Ambulatory Visit: Payer: Managed Care, Other (non HMO) | Admitting: Orthopedic Surgery

## 2019-12-03 ENCOUNTER — Ambulatory Visit: Payer: Managed Care, Other (non HMO) | Attending: Urology | Admitting: Physical Therapy

## 2019-12-03 ENCOUNTER — Other Ambulatory Visit: Payer: Self-pay

## 2019-12-03 ENCOUNTER — Encounter: Payer: Self-pay | Admitting: Physical Therapy

## 2019-12-03 DIAGNOSIS — R252 Cramp and spasm: Secondary | ICD-10-CM | POA: Diagnosis present

## 2019-12-03 DIAGNOSIS — R278 Other lack of coordination: Secondary | ICD-10-CM

## 2019-12-03 DIAGNOSIS — M6281 Muscle weakness (generalized): Secondary | ICD-10-CM | POA: Diagnosis not present

## 2019-12-03 NOTE — Therapy (Signed)
[facility] [address] [place], [place], [address] Phone: [phone]   Fax:  [phone]  Physical Therapy Evaluation  Patient Details  Name: Whitney Thornton MRN: [id number] Date of Birth: Jul 06, 1965 Referring Provider (PT): Dr. Berniece Salines   Encounter Date: 12/03/2019   PT End of Session - 12/03/19 0932    Visit Number 1    Date for PT Re-Evaluation 02/25/20    Authorization Type [employer]    PT Start Time [phone]   came late   PT Stop Time 0930    PT Time Calculation (min) 35 min    Activity Tolerance Patient tolerated treatment well;No increased pain    Behavior During Therapy WFL for tasks assessed/performed           Past Medical History:  Diagnosis Date  . Diabetes mellitus without complication (HCC)   . Nephritis     Past Surgical History:  Procedure Laterality Date  . ABDOMINAL HYSTERECTOMY    . CESAREAN SECTION    . CHOLECYSTECTOMY    . KNEE SURGERY Bilateral   . SINUS EXPLORATION     sinus surgery    There were no vitals filed for this visit.    Subjective Assessment - 12/03/19 0859    Subjective Had trouble for 3 years when she was in the hospitla for Sepsis started with the pain. Patient has recurrent UTI. Her ribs get tender when it gets tender which is due to the kidneys. Patient has burning and discomfort with urinating. Patient unable to have a strong stream of urine. Does not feel she completely empties her bladder. Sometimes strains to get the urine out.    Patient Stated Goals stop the pain    Currently in Pain? Yes    Pain Score 6     Pain Location Pelvis    Pain Orientation Mid    Pain Descriptors / Indicators Throbbing    Pain Type Chronic pain    Pain Onset More than a month ago    Pain Frequency Intermittent    Aggravating Factors  urination, just hurts    Pain Relieving Factors does not know what helps the pain    Multiple Pain Sites Yes    Pain Score 7     Pain Location Rib cage    Pain Orientation Left;Right;Posterior    Pain Descriptors / Indicators Tender    Pain Type Chronic pain    Pain Onset More than a month ago    Pain Frequency Constant    Aggravating Factors  touching the ribs, breathing,    Pain Relieving Factors not sure              [facility] PT Assessment - 12/03/19 0001      Assessment   Medical Diagnosis N30.20 chronic cystitis, R30.9 painful micturition    Referring Provider (PT) Dr. Berniece Salines    Onset Date/Surgical Date --   3 years ago   Prior Therapy none      Precautions   Precautions None      Restrictions   Weight Bearing Restrictions No      Balance Screen   Has the patient fallen in the past 6 months No    Has the patient had a decrease in activity level because of a fear of falling?  No    Is the patient reluctant to leave their home because of a fear of falling?  No      Home Environment  Living Environment Private residence      Prior Function   Level of Independence Independent    Vocation Full time employment    Vocation Requirements [employer], [employer]    Leisure walk dogs, play with dogs      Cognition   Overall Cognitive Status Within Functional Limits for tasks assessed      Observation/Other Assessments   Focus on Therapeutic Outcomes (FOTO)  UIQ-7 43%      Posture/Postural Control   Posture/Postural Control No significant limitations      ROM / Strength   AROM / PROM / Strength AROM;PROM;Strength      AROM   Overall AROM Comments full lumbar ROM      Strength   Right Hip Extension 3+/5    Right Hip ABduction 4/5    Left Hip Extension 3+/5    Left Hip ABduction 4/5      Palpation   Palpation comment breathing increased back rib cage pain and decreased movement of lower rib cage; tenderness located in right lower quadrant causing a feeling of having to urinate; Decreased mobility of c-section scar                      Objective measurements completed  on examination: See above findings.     Pelvic Floor Special Questions - 12/03/19 0001    Prior Pregnancies Yes    Number of Pregnancies 3    Number of C-Sections 3   1 set of twins   Currently Sexually Active Yes    Is this Painful No    Urinary Leakage Yes    Activities that cause leaking Other    Other activities that cause leaking drips urine and does not feel it    Urinary urgency Yes    Fecal incontinence --   has not had a bowel movement in 5 days   Skin Integrity Intact   dryness   Pelvic Floor Internal Exam Patient confirms identification and approves PT to assess pelvic floor and treatment    Exam Type Vaginal    Palpation tightess in the perineal body and the sides of the bladder    Strength fair squeeze, definite lift                      PT Short Term Goals - 12/03/19 1359      PT SHORT TERM GOAL #1   Title independent with initial HEP    Time 12    Period Weeks    Status New    Target Date 02/25/20      PT SHORT TERM GOAL #2   Title understand vaginal health to reduce dryness    Time 12    Period Weeks    Status New    Target Date 02/25/20      PT SHORT TERM GOAL #3   Title educated on abdominal massage and c-section scar to improve tissue mobitliy    Time 12    Period Weeks    Status New    Target Date 02/25/20             PT Long Term Goals - 12/03/19 1613      PT LONG TERM GOAL #1   Title independent with advanced HEP for the pelvic floor    Time 12    Period Weeks    Status New    Target Date 02/25/20      PT LONG TERM  GOAL #2   Title pain with urination decreased >/= 75% due to improved muscle relaxation    Time 12    Period Weeks    Status New    Target Date 02/25/20      PT LONG TERM GOAL #3   Title able to fully empty her bladder with increased force of her stream due to improve pelvic floor coordination    Time 12    Period Weeks    Status New    Target Date 02/25/20      PT LONG TERM GOAL #4   Title able to  delay the urge to urinated using behavioral techniques >/= 75%    Time 12    Period Weeks    Status New    Target Date 02/25/20      PT LONG TERM GOAL #5   Title reducation of posterior rib tenderness due to improved tissue mobiltiy    Time 12    Period Weeks    Status New    Target Date 02/25/20                  Plan - 12/03/19 0933    Clinical Impression Statement Patient is a 54 year old female with pelvic and posterior rib pain for the past 3 years after she had Sepsis. Patient has had recurrent UTI. Patient reports constant posterior rib pain at level 7/10 when she touches the area or lay on her back. Patient pelvic floor pian is 6/10 intermittently and not sure what increases her pain. Patient has difficulty with going to the bathroom frequently and has a strong urge. Pelvic floor strength is 3/5. She has tightness in the perirnal body and sides of the urethra. Patient has thickness and decreased mobility of the c-secton scar. She has tenderness located in the right lower quadrant and post. rib cage. She has decreased movement of the lower rib cage. Patient has weakness in  bilateral hip abduction and extension. Patient will benefit from skilled therapy to work on pelvic floor coordination, increased mobiity of the tissue and reduce her pain.    Personal Factors and Comorbidities Age;Fitness;Comorbidity 3+;Time since onset of injury/illness/exacerbation    Comorbidities Diabetes, C-section, Abdominal hysterectomy    Examination-Activity Limitations Sleep;Continence;Toileting;Bed Mobility    Examination-Participation Restrictions Occupation;Driving    Stability/Clinical Decision Making Stable/Uncomplicated    Clinical Decision Making Low    Rehab Potential Good    PT Frequency 1x / week    PT Duration 12 weeks    PT Treatment/Interventions ADLs/Self Care Home Management;Biofeedback;Cryotherapy;Electrical Stimulation;Moist Heat;Ultrasound;Neuromuscular re-education;Therapeutic  exercise;Therapeutic activities;Patient/family education;Manual techniques;Dry needling;Scar mobilization;Spinal Manipulations    PT Next Visit Plan work on c-section scar, diaphragmatic breathing, gentle rib mobilization and manual work, hip stretches for the adductors, cat cow, childs pose    Consulted and Agree with Plan of Care Patient           Patient will benefit from skilled therapeutic intervention in order to improve the following deficits and impairments:  Decreased coordination, Increased fascial restricitons, Decreased endurance, Increased muscle spasms, Pain, Decreased activity tolerance, Decreased strength, Decreased scar mobility  Visit Diagnosis: Muscle weakness (generalized) - Plan: PT plan of care cert/re-cert  Other lack of coordination - Plan: PT plan of care cert/re-cert  Cramp and spasm - Plan: PT plan of care cert/re-cert     Problem List There are no problems to display for this patient.   Eulis Foster, PT 12/03/19 4:22 PM   [facility] [address] [place], [place], [address] Phone: [phone]   Fax:  [phone]  Name: Whitney Thornton MRN: [id number] Date of Birth: 02-May-1965

## 2019-12-10 ENCOUNTER — Other Ambulatory Visit: Payer: Self-pay

## 2019-12-10 ENCOUNTER — Encounter: Payer: Self-pay | Admitting: Physical Therapy

## 2019-12-10 ENCOUNTER — Ambulatory Visit: Payer: Managed Care, Other (non HMO) | Admitting: Physical Therapy

## 2019-12-10 DIAGNOSIS — R278 Other lack of coordination: Secondary | ICD-10-CM

## 2019-12-10 DIAGNOSIS — M6281 Muscle weakness (generalized): Secondary | ICD-10-CM | POA: Diagnosis not present

## 2019-12-10 DIAGNOSIS — R252 Cramp and spasm: Secondary | ICD-10-CM

## 2019-12-10 NOTE — Therapy (Signed)
[facility] [address] [place], [place], [address] Phone: [phone]   Fax:  [phone]  Physical Therapy Treatment  Patient Details  Name: Whitney Thornton MRN: [id number] Date of Birth: Sep 14, 1965 Referring Provider (PT): Dr. Berniece Salines   Encounter Date: 12/10/2019   PT End of Session - 12/10/19 0807    Visit Number 2    Date for PT Re-Evaluation 02/25/20    Authorization Type [employer]    PT Start Time 0807   LATE   PT Stop Time 0840    PT Time Calculation (min) 33 min    Activity Tolerance Patient tolerated treatment well;No increased pain    Behavior During Therapy WFL for tasks assessed/performed           Past Medical History:  Diagnosis Date  . Diabetes mellitus without complication (HCC)   . Nephritis     Past Surgical History:  Procedure Laterality Date  . ABDOMINAL HYSTERECTOMY    . CESAREAN SECTION    . CHOLECYSTECTOMY    . KNEE SURGERY Bilateral   . SINUS EXPLORATION     sinus surgery    There were no vitals filed for this visit.   Subjective Assessment - 12/10/19 0808    Subjective The miralax has kicked. I have been relaxing the muscles at night and only wake up 1 time per night.    Patient Stated Goals stop the pain    Currently in Pain? Yes    Pain Score 4     Pain Location Pelvis    Pain Orientation Mid    Pain Descriptors / Indicators Throbbing    Pain Type Chronic pain    Pain Onset More than a month ago    Pain Frequency Intermittent    Aggravating Factors  urination, just hurts    Pain Relieving Factors does not know what helps the pain    Multiple Pain Sites Yes    Pain Score 7    Pain Location Rib cage    Pain Orientation Right;Left;Posterior    Pain Descriptors / Indicators Tender    Pain Type Chronic pain    Pain Onset More than a month ago    Pain Frequency Constant    Aggravating Factors  touching the ribls, breathing    Pain Relieving Factors  not sure                             [facility] Adult PT Treatment/Exercise - 12/10/19 0001      Lumbar Exercises: Stretches   Quadruped Mid Back Stretch 1 rep;30 seconds    Quadruped Mid Back Stretch Limitations childs pose    Piriformis Stretch Right;Left;1 rep;30 seconds    Piriformis Stretch Limitations supine    Other Lumbar Stretch Exercise hip adductor stretch in sitting 30 sec. 2 times      Lumbar Exercises: Quadruped   Madcat/Old Horse 10 reps      Manual Therapy   Manual Therapy Soft tissue mobilization    Manual therapy comments educated patient on scar massage for the abdomen    Soft tissue mobilization scar massage to improve mobility; Soft tissue work on the lower abdoment to release the tissue and reduce the tightness, monitored the scar to make sure it stays intact                  PT Education - 12/10/19 0842    Education Details  Access Code: [email or web address]; C-section scar massage    Person(s) Educated Patient    Methods Explanation;Demonstration;Verbal cues    Comprehension Verbalized understanding;Returned demonstration            PT Short Term Goals - 12/03/19 1359      PT SHORT TERM GOAL #1   Title independent with initial HEP    Time 12    Period Weeks    Status New    Target Date 02/25/20      PT SHORT TERM GOAL #2   Title understand vaginal health to reduce dryness    Time 12    Period Weeks    Status New    Target Date 02/25/20      PT SHORT TERM GOAL #3   Title educated on abdominal massage and c-section scar to improve tissue mobitliy    Time 12    Period Weeks    Status New    Target Date 02/25/20             PT Long Term Goals - 12/03/19 1613      PT LONG TERM GOAL #1   Title independent with advanced HEP for the pelvic floor    Time 12    Period Weeks    Status New    Target Date 02/25/20      PT LONG TERM GOAL #2   Title pain with urination decreased >/= 75% due to improved muscle relaxation    Time  12    Period Weeks    Status New    Target Date 02/25/20      PT LONG TERM GOAL #3   Title able to fully empty her bladder with increased force of her stream due to improve pelvic floor coordination    Time 12    Period Weeks    Status New    Target Date 02/25/20      PT LONG TERM GOAL #4   Title able to delay the urge to urinated using behavioral techniques >/= 75%    Time 12    Period Weeks    Status New    Target Date 02/25/20      PT LONG TERM GOAL #5   Title reducation of posterior rib tenderness due to improved tissue mobiltiy    Time 12    Period Weeks    Status New    Target Date 02/25/20                 Plan - 12/10/19 8329    Clinical Impression Statement Patient is now getting up 1 time instead of several times by relaxing the pelvic floor. Patient reports her pelvic pain has decreased from 6/10 to 4/10. Patient C-section scar is red so when doing manual work need to monitor the integrity. Patient will benefit from skilled therapy to work on pelvic floor coordination, increasedmobility of the tissue and reduce her pain.    Personal Factors and Comorbidities Age;Fitness;Comorbidity 3+;Time since onset of injury/illness/exacerbation    Comorbidities Diabetes, C-section, Abdominal hysterectomy    Examination-Activity Limitations Sleep;Continence;Toileting;Bed Mobility    Examination-Participation Restrictions Occupation;Driving    Stability/Clinical Decision Making Stable/Uncomplicated    Rehab Potential Good    PT Frequency 1x / week    PT Duration 12 weeks    PT Treatment/Interventions ADLs/Self Care Home Management;Biofeedback;Cryotherapy;Electrical Stimulation;Moist Heat;Ultrasound;Neuromuscular re-education;Therapeutic exercise;Therapeutic activities;Patient/family education;Manual techniques;Dry needling;Scar mobilization;Spinal Manipulations    PT Next Visit Plan work on c-section scar, diaphragmatic breathing, gentle rib mobilization  and manual work,  eduation on vaginal health    PT Home Exercise Plan Access Code: [email or web address]    Recommended Other Services MD signed initial eval    Consulted and Agree with Plan of Care Patient           Patient will benefit from skilled therapeutic intervention in order to improve the following deficits and impairments:  Decreased coordination, Increased fascial restricitons, Decreased endurance, Increased muscle spasms, Pain, Decreased activity tolerance, Decreased strength, Decreased scar mobility  Visit Diagnosis: Muscle weakness (generalized)  Other lack of coordination  Cramp and spasm     Problem List There are no problems to display for this patient.   Eulis Foster, PT 12/10/19 8:46 AM   [facility] [address] [place], [place], [address] Phone: [phone]   Fax:  [phone]  Name: Latreshia Beauchaine MRN: [id number] Date of Birth: [birth year]

## 2019-12-10 NOTE — Patient Instructions (Signed)
Access Code: [email or web address] URL: [email or web address] Date: 12/10/2019 Prepared by: Eulis Foster  Exercises Cat-Camel - 1 x daily - 7 x weekly - 1 sets - 10 reps Child's Pose Stretch - 1 x daily - 7 x weekly - 1 sets - 11 reps - 30 sec hold V Sit Hip Adductor Hamstring Stretch - 1 x daily - 7 x weekly - 1 sets - 1 reps - 30 sec hold Supine Figure 4 Piriformis Stretch with Leg Extension - 1 x daily - 7 x weekly - 1 sets - 2 reps - 30 sec hold [facility] [address], [address] [place], [place] [address] Phone # [phone] Fax [phone]

## 2019-12-16 ENCOUNTER — Other Ambulatory Visit (INDEPENDENT_AMBULATORY_CARE_PROVIDER_SITE_OTHER): Payer: Self-pay | Admitting: Primary Care

## 2019-12-16 ENCOUNTER — Other Ambulatory Visit: Payer: Self-pay | Admitting: Surgical

## 2019-12-16 DIAGNOSIS — E039 Hypothyroidism, unspecified: Secondary | ICD-10-CM

## 2019-12-16 NOTE — Telephone Encounter (Signed)
Requested medication (s) are due for refill today: yes  Requested medication (s) are on the active medication list: yes  Last refill:  07/04/19   Future visit scheduled: no  Notes to clinic:  overdue lab work   Requested Prescriptions  Pending Prescriptions Disp Refills   levothyroxine (SYNTHROID) 100 MCG tablet [Pharmacy Med Name: L-THYROXINE (SYNTHROID) TABS 100MCG] 90 tablet 3    Sig: TAKE 1 TABLET DAILY      Endocrinology:  Hypothyroid Agents Failed - 12/16/2019  8:56 AM      Failed - TSH needs to be rechecked within 3 months after an abnormal result. Refill until TSH is due.      Failed - TSH in normal range and within 360 days    TSH  Date Value Ref Range Status  07/30/2019 0.238 (L) 0.450 - 4.500 uIU/mL Final          Passed - Valid encounter within last 12 months    Recent Outpatient Visits           3 months ago Urinary tract infection without hematuria, site unspecified   [facility] Gwinda Passe P, NP   3 months ago Dysuria   [facility] Gwinda Passe P, NP   5 months ago Dysuria   [facility]   6 months ago Type 2 diabetes mellitus without complication, without long-term current use of insulin (HCC)   [facility] Gwinda Passe P, NP   8 months ago Type 2 diabetes mellitus without complication, without long-term current use of insulin (HCC)   [facility] Grayce Sessions, NP

## 2019-12-17 NOTE — Telephone Encounter (Signed)
Please advise. Thanks.  

## 2019-12-20 ENCOUNTER — Ambulatory Visit: Payer: Managed Care, Other (non HMO) | Admitting: Physical Therapy

## 2019-12-21 ENCOUNTER — Encounter (INDEPENDENT_AMBULATORY_CARE_PROVIDER_SITE_OTHER): Payer: Self-pay | Admitting: Primary Care

## 2019-12-25 NOTE — Telephone Encounter (Signed)
Need labs

## 2020-01-03 ENCOUNTER — Ambulatory Visit: Payer: Managed Care, Other (non HMO) | Admitting: Physical Therapy

## 2020-01-07 ENCOUNTER — Other Ambulatory Visit (INDEPENDENT_AMBULATORY_CARE_PROVIDER_SITE_OTHER): Payer: Self-pay | Admitting: Primary Care

## 2020-01-07 DIAGNOSIS — E039 Hypothyroidism, unspecified: Secondary | ICD-10-CM

## 2020-01-10 ENCOUNTER — Ambulatory Visit: Payer: Managed Care, Other (non HMO) | Admitting: Physical Therapy

## 2020-01-16 ENCOUNTER — Ambulatory Visit (INDEPENDENT_AMBULATORY_CARE_PROVIDER_SITE_OTHER): Payer: Managed Care, Other (non HMO) | Admitting: Surgical

## 2020-01-16 ENCOUNTER — Ambulatory Visit (INDEPENDENT_AMBULATORY_CARE_PROVIDER_SITE_OTHER): Payer: Managed Care, Other (non HMO)

## 2020-01-16 ENCOUNTER — Other Ambulatory Visit: Payer: Self-pay

## 2020-01-16 ENCOUNTER — Ambulatory Visit (INDEPENDENT_AMBULATORY_CARE_PROVIDER_SITE_OTHER): Payer: Managed Care, Other (non HMO) | Admitting: Primary Care

## 2020-01-16 ENCOUNTER — Telehealth (INDEPENDENT_AMBULATORY_CARE_PROVIDER_SITE_OTHER): Payer: Self-pay

## 2020-01-16 DIAGNOSIS — M1712 Unilateral primary osteoarthritis, left knee: Secondary | ICD-10-CM | POA: Diagnosis not present

## 2020-01-16 DIAGNOSIS — S52124A Nondisplaced fracture of head of right radius, initial encounter for closed fracture: Secondary | ICD-10-CM

## 2020-01-16 NOTE — Telephone Encounter (Signed)
Patient needs lab appointment to check thyroid before she can get refills. Last TSH was in July.

## 2020-01-16 NOTE — Telephone Encounter (Signed)
Copied from CRM [id number]. Topic: General - Inquiry >> Jan 15, 2020  5:30 PM Whitney Thornton wrote: Reason for CRM: Patient had an medication refill appointment scheduled but had to re-schedule and she needs her medication filled until she can be seen. The name of the medication that she needs is Levothyroxine . Please advise  Please advise

## 2020-01-19 ENCOUNTER — Encounter: Payer: Self-pay | Admitting: Surgical

## 2020-01-19 NOTE — Progress Notes (Signed)
Office Visit Note   Patient: Whitney Thornton           Date of Birth: 01-17-1966           MRN: [id number] Visit Date: 01/16/2020 Requested by: Grayce Sessions, NP [address] [place],  [place] [address] PCP: Grayce Sessions, NP  Subjective: Chief Complaint  Patient presents with  . Left Knee - Pain  . Other    Right forearm/elbow pain    HPI: Mickey Hebel is a 55 y.o. female who presents to the office complaining of right arm and left knee pain.  Patient states that she fell 3 weeks ago.  She sustained a FOOSH injury to the right upper extremity.  She notes no bruising or swelling after the injury initially.  She does report that she is unable to fully extend her elbow.  She is right-hand dominant.  Denies any numbness/tingling.  Localizes majority of her pain to the proximal radius.  Pain is improving and her ability to extend the elbow is returning to normal somewhat.  No other complaints of pain since the fall aside from skinning her right knee.  Additionally she complains of continued left knee pain.  She has history of left knee osteoarthritis.  She has had cortisone and gel injections without significant relief.  She did have a series of 3 dexterous injections that have provided temporary relief for her left and right knees.  Last injection to the left knee was on 9/10.  Lasted about a month and a half before pain returned.  She now complains that her left knee is bothering her more than it did before the x-rays injections.  She denies any groin pain or radicular pain.  No recent injuries to the left knee.  She feels like the left knee constantly wants to give out on her.  Her last A1c was 7.5 and she does have a history of diabetes.  Denies any family or personal history of blood clots.  Denies any cardiac history.  She has MRI of the left knee from July 2021 that revealed partial thickness cartilage loss of the patellofemoral compartment with focal  high-grade partial-thickness cartilage loss of the lateral patellar facet, mild partial-thickness cartilage loss of the medial compartment and partial-thickness cartilage loss of the lateral compartment as well..                ROS: All systems reviewed are negative as they relate to the chief complaint within the history of present illness.  Patient denies fevers or chills.  Assessment & Plan: Visit Diagnoses:  1. Closed nondisplaced fracture of head of right radius, initial encounter   2. Unilateral primary osteoarthritis, left knee     Plan: Patient is a 55 year old female who presents complaining of right elbow pain and left knee pain.  She sustained FOOSH injury to the right upper extremity 3 weeks ago and has been complaining of right elbow pain since.  It is slowly improving.  Radiographs today show a minimally displaced radial head fracture.  This is likely responsible for joint effusion that is preventing her full extension.  With pain improving, recommended patient continue to take the ibuprofen that she has been taking every day in order to prevent heterotopic ossification.  No sling necessary at this point.  She will hold off on lifting anything more than 1 or 2 pounds with that arm.  Additionally she complains of left knee pain.  She has a history of knee  pain that has been successfully treated for about 6 weeks with dexterous injections back in September.  However those have worn off and she states that she does not want to try them again as she wants definitive solution to her knee pain.  Cortisone injection and gel injection have provided no lasting relief.  Discussed knee replacement surgery including the recovery timeline and the risks/benefits of the procedure.  She understands it is a 2 to 44-month recovery with very intensive rehab process.  Additionally we went over the risks of the procedure including the risk of nerve/vessel damage, knee stiffness, knee instability, need for  revision surgery, joint infection.  She understands she is a high risk due to her history of diabetes though this is under better control more recently.  She has upcoming blood work to reexamine her A1c but her last A1c several months ago was 7.5.  After discussion of knee replacement, she would like to proceed with procedure.  Plan to post patient for left knee replacement and follow-up after procedure.  She is planning for late January or early February for surgery which should allow ample time for her radial head fracture to heal before she has to weight-bear with a walker.  Follow-Up Instructions: No follow-ups on file.   Orders:  Orders Placed This Encounter  Procedures  . XR Forearm Right   No orders of the defined types were placed in this encounter.     Procedures: No procedures performed   Clinical Data: No additional findings.  Objective: Vital Signs: There were no vitals taken for this visit.  Physical Exam:  Constitutional: Patient appears well-developed HEENT:  Head: Normocephalic Eyes:EOM are normal Neck: Normal range of motion Cardiovascular: Normal rate Pulmonary/chest: Effort normal Neurologic: Patient is alert Skin: Skin is warm Psychiatric: Patient has normal mood and affect  Ortho Exam: Ortho exam demonstrates right elbow with full flexion but lacking about 5 degrees of full extension.  Pain elicited in the elbow with pronation/supination of the right forearm.  Bicep tendon palpable with equivalent contour compared with the left elbow.  No tenderness over the bicep tendon, tricep tendon, olecranon bursa, medial epicondyle.  Tenderness over the lateral epicondyle.  Prior incision through the midline of the left knee from prior surgery.  Tenderness over the medial and lateral joint lines.  Able to perform straight leg raise.  No pain with hip range of motion.  Negative Stinchfield exam.  No calf tenderness.  Negative Homans' sign.  Specialty Comments:  No  specialty comments available.  Imaging: No results found.   PMFS History: There are no problems to display for this patient.  Past Medical History:  Diagnosis Date  . Diabetes mellitus without complication (HCC)   . Nephritis     No family history on file.  Past Surgical History:  Procedure Laterality Date  . ABDOMINAL HYSTERECTOMY    . CESAREAN SECTION    . CHOLECYSTECTOMY    . KNEE SURGERY Bilateral   . SINUS EXPLORATION     sinus surgery   Social History   Occupational History  . Not on file  Tobacco Use  . Smoking status: Never Smoker  . Smokeless tobacco: Never Used  Substance and Sexual Activity  . Alcohol use: Not Currently  . Drug use: Not Currently  . Sexual activity: Not on file

## 2020-01-21 ENCOUNTER — Ambulatory Visit: Payer: 59 | Admitting: Physical Therapy

## 2020-01-28 ENCOUNTER — Encounter: Payer: Self-pay | Admitting: Physical Therapy

## 2020-01-28 ENCOUNTER — Other Ambulatory Visit: Payer: Self-pay

## 2020-01-28 ENCOUNTER — Ambulatory Visit: Payer: 59 | Attending: Urology | Admitting: Physical Therapy

## 2020-01-28 DIAGNOSIS — R252 Cramp and spasm: Secondary | ICD-10-CM

## 2020-01-28 DIAGNOSIS — R278 Other lack of coordination: Secondary | ICD-10-CM | POA: Diagnosis present

## 2020-01-28 DIAGNOSIS — M6281 Muscle weakness (generalized): Secondary | ICD-10-CM | POA: Insufficient documentation

## 2020-01-28 NOTE — Therapy (Addendum)
[facility] [address] [place], [place], [address] Phone: [phone]   Fax:  [phone]  Physical Therapy Treatment  Patient Details  Name: Whitney Thornton MRN: [id number] Date of Birth: [birth year] Referring Provider (PT): Dr. Louis Meckel   Encounter Date: 01/28/2020   PT End of Session - 01/28/20 1152    Visit Number 3    Date for PT Re-Evaluation 02/25/20    Authorization Type [employer]    PT Start Time 9983    PT Stop Time 1225    PT Time Calculation (min) 40 min    Activity Tolerance Patient tolerated treatment well;No increased pain    Behavior During Therapy WFL for tasks assessed/performed           Past Medical History:  Diagnosis Date  . Diabetes mellitus without complication ([place])   . Nephritis     Past Surgical History:  Procedure Laterality Date  . ABDOMINAL HYSTERECTOMY    . CESAREAN SECTION    . CHOLECYSTECTOMY    . KNEE SURGERY Bilateral   . SINUS EXPLORATION     sinus surgery    There were no vitals filed for this visit.   Subjective Assessment - 01/28/20 1149    Subjective I have fallen and have a hairline fracture in the right elbow. I have not been able to do my exercises. I had to put my dog down last year.Pain with urination has improved by 60%. Rib tenderness is 70% better.    Patient Stated Goals stop the pain    Currently in Pain? Yes    Pain Score 3     Pain Location Pelvis    Pain Orientation Mid    Pain Descriptors / Indicators Throbbing    Pain Type Chronic pain    Pain Onset More than a month ago    Pain Frequency Intermittent    Aggravating Factors  just happens    Pain Relieving Factors does not know what helps    Multiple Pain Sites Yes    Pain Score 4    Pain Location Rib cage    Pain Orientation Right;Left;Posterior    Pain Descriptors / Indicators Tender    Pain Type Chronic pain    Pain Onset More than a month ago    Pain Frequency  Constant    Aggravating Factors  touching the ribs, breathing    Pain Relieving Factors not sure              [facility] PT Assessment - 01/28/20 0001      Assessment   Medical Diagnosis N30.20 chronic cystitis, R30.9 painful micturition    Referring Provider (PT) Dr. Louis Meckel    Onset Date/Surgical Date --   3 years ago   Prior Therapy none      Precautions   Precautions None      Restrictions   Weight Bearing Restrictions No      Home Environment   Living Environment Private residence      Prior Function   Level of Independence Independent    Vocation Full time employment    Vocation Requirements door dash, [employer]    Leisure walk dogs, play with dogs      Cognition   Overall Cognitive Status Within Functional Limits for tasks assessed      Observation/Other Assessments   Focus on Therapeutic Outcomes (FOTO)  UIQ-7 43%      Posture/Postural Control   Posture/Postural Control No significant limitations  AROM   Overall AROM Comments full lumbar ROM                         [facility] Adult PT Treatment/Exercise - 01/28/20 0001      Manual Therapy   Manual Therapy Soft tissue mobilization;Myofascial release    Soft tissue mobilization scar massage to improve mobility; Soft tissue work on the lower abdoment to release the tissue and reduce the tightness, monitored the scar to make sure it stays intact, manual work to the left ischiocavernosus; manual work to the superior part of the pubic bone    Myofascial Release fascial work to the bladder, sac of douglas,                    PT Short Term Goals - 01/28/20 1235      PT [place] #1   Title independent with initial HEP    Time 12    Period Weeks    Status Achieved      PT SHORT TERM GOAL #2   Title understand vaginal health to reduce dryness    Time 12    Status On-going      PT SHORT TERM GOAL #3   Title educated on abdominal massage and c-section scar to improve  tissue mobitliy    Time 12    Period Weeks    Status On-going             PT Long Term Goals - 12/03/19 1613      PT LONG TERM GOAL #1   Title independent with advanced HEP for the pelvic floor    Time 12    Period Weeks    Status New    Target Date 02/25/20      PT LONG TERM GOAL #2   Title pain with urination decreased >/= 75% due to improved muscle relaxation    Time 12    Period Weeks    Status New    Target Date 02/25/20      PT LONG TERM GOAL #3   Title able to fully empty her bladder with increased force of her stream due to improve pelvic floor coordination    Time 12    Period Weeks    Status New    Target Date 02/25/20      PT LONG TERM GOAL #4   Title able to delay the urge to urinated using behavioral techniques >/= 75%    Time 12    Period Weeks    Status New    Target Date 02/25/20      PT LONG TERM GOAL #5   Title reducation of posterior rib tenderness due to improved tissue mobiltiy    Time 12    Period Weeks    Status New    Target Date 02/25/20                 Plan - 01/28/20 1154    Clinical Impression Statement Patient reports she is able to empty the bladder 70% of the time. Patient posterior rib pain decreased by 70%. Her pain with urination is 60% better. She continues to have tightness along the c-sections scar. Patient has tenderness along the left ischiocavernosus, along the symphsis pubis and lower abdominal area. Patient has mot been able to do the self massage due to hairline fracture in her right elbow. Patient will benefit from skilled theray to work on pelvic floor  coordination, increase mobility of the tissue and reduce her pain.    Personal Factors and Comorbidities Age;Fitness;Comorbidity 3+;Time since onset of injury/illness/exacerbation    Comorbidities Diabetes, C-section, Abdominal hysterectomy    Examination-Activity Limitations Sleep;Continence;Toileting;Bed Mobility    Examination-Participation Restrictions  Occupation;Driving    Stability/Clinical Decision Making Stable/Uncomplicated    Rehab Potential Good    PT Frequency 1x / week    PT Duration 12 weeks    PT Treatment/Interventions ADLs/Self Care Home Management;Biofeedback;Cryotherapy;Electrical Stimulation;Moist Heat;Ultrasound;Neuromuscular re-education;Therapeutic exercise;Therapeutic activities;Patient/family education;Manual techniques;Dry needling;Scar mobilization;Spinal Manipulations    PT Next Visit Plan work on c-section scar and have her do at home, diaphragmatic breathing, gentle rib mobilization and manual work, eduation on vaginal healthinternal work on the perineal body, bulging of the pelvic floor    PT Home Exercise Plan Access Code: [email or web address]    Consulted and Agree with Plan of Care Patient           Patient will benefit from skilled therapeutic intervention in order to improve the following deficits and impairments:  Decreased coordination,Increased fascial restricitons,Decreased endurance,Increased muscle spasms,Pain,Decreased activity tolerance,Decreased strength,Decreased scar mobility  Visit Diagnosis: Muscle weakness (generalized)  Other lack of coordination  Cramp and spasm     Problem List There are no problems to display for this patient.   Earlie Counts, PT 01/28/20 12:39 PM   [facility] [address] [place], [place], [address] Phone: [phone]   Fax:  [phone]  Name: Whitney Thornton MRN: [id number] Date of Birth: [birth year]  PHYSICAL THERAPY DISCHARGE SUMMARY  Visits from Start of Care: 3  Current functional level related to goals / functional outcomes: See above.    Remaining deficits: See above.    Education / Equipment: HEP  Plan:                                                    Patient goals were not met. Patient is being discharged due to not returning since the last visit. Thank you for the  referral. Earlie Counts, PT 04/14/20 5:00 PM   ?????

## 2020-01-29 ENCOUNTER — Other Ambulatory Visit: Payer: Self-pay

## 2020-01-29 ENCOUNTER — Ambulatory Visit (INDEPENDENT_AMBULATORY_CARE_PROVIDER_SITE_OTHER): Payer: 59 | Admitting: Primary Care

## 2020-01-29 DIAGNOSIS — Z760 Encounter for issue of repeat prescription: Secondary | ICD-10-CM

## 2020-01-29 DIAGNOSIS — Z20822 Contact with and (suspected) exposure to covid-19: Secondary | ICD-10-CM

## 2020-01-29 DIAGNOSIS — E039 Hypothyroidism, unspecified: Secondary | ICD-10-CM

## 2020-01-29 MED ORDER — LEVOTHYROXINE SODIUM 100 MCG PO TABS: 100.0000 ug | ORAL_TABLET | Freq: Every day | ORAL | 0 refills | Status: DC

## 2020-01-29 NOTE — Progress Notes (Signed)
Acute Office Visit  Subjective:    Patient ID: Whitney Thornton, female    DOB: 04-Sep-1965, 55 y.o.   MRN: [id number]  No chief complaint on file.   HPI Ms. Whitney Thornton initial visit was for the management of thyroid . Visit change to COVID screening was not mention in intake  Recent cruise in the last 2-3 days, cough, runny nose and husband has symptoms Past Medical History:  Diagnosis Date  . Diabetes mellitus without complication (HCC)   . Nephritis     Past Surgical History:  Procedure Laterality Date  . ABDOMINAL HYSTERECTOMY    . CESAREAN SECTION    . CHOLECYSTECTOMY    . KNEE SURGERY Bilateral   . SINUS EXPLORATION     sinus surgery    No family history on file.  Social History   Socioeconomic History  . Marital status: Married    Spouse name: Not on file  . Number of children: Not on file  . Years of education: Not on file  . Highest education level: Not on file  Occupational History  . Not on file  Tobacco Use  . Smoking status: Never Smoker  . Smokeless tobacco: Never Used  Substance and Sexual Activity  . Alcohol use: Not Currently  . Drug use: Not Currently  . Sexual activity: Not on file  Other Topics Concern  . Not on file  Social History Narrative  . Not on file   Social Determinants of Health   Financial Resource Strain: Not on file  Food Insecurity: Not on file  Transportation Needs: Not on file  Physical Activity: Not on file  Stress: Not on file  Social Connections: Not on file  Intimate Partner Violence: Not on file    Outpatient Medications Prior to Visit  Medication Sig Dispense Refill  . albuterol (VENTOLIN HFA) 108 (90 Base) MCG/ACT inhaler Inhale 1 puff into the lungs every 6 (six) hours as needed for wheezing or shortness of breath. 18 g 1  . amoxicillin-clavulanate (AUGMENTIN) 875-125 MG tablet Take 1 tablet by mouth 2 (two) times daily. 20 tablet 0  . aspirin EC 81 MG tablet Take 81 mg by mouth  at bedtime.    . ciprofloxacin (CIPRO) 500 MG tablet Take 1 tablet (500 mg total) by mouth 2 (two) times daily. 6 tablet 0  . cyclobenzaprine (FLEXERIL) 10 MG tablet Take 1 tablet (10 mg total) by mouth 3 (three) times daily as needed for muscle spasms. 60 tablet 3  . diazepam (DIASTAT) 2.5 MG GEL Place 2.5 mg rectally once.    . diphenhydramine-acetaminophen (TYLENOL PM) 25-500 MG TABS tablet Take 1 tablet by mouth at bedtime as needed (sleep/pain).    Marland Kitchen ezetimibe (ZETIA) 10 MG tablet Take 1 tablet (10 mg total) by mouth daily. 90 tablet 1  . fluconazole (DIFLUCAN) 150 MG tablet Take 1 tablet (150 mg total) by mouth daily. Take after your complete your antibiotic prescription. 1 tablet 0  . glipiZIDE (GLUCOTROL XL) 10 MG 24 hr tablet Take 1 tablet (10 mg total) by mouth 2 (two) times daily after a meal. 180 tablet 1  . Insulin Glargine (BASAGLAR KWIKPEN) 100 UNIT/ML Inject 0.15 mLs (15 Units total) into the skin at bedtime. 6 pen 1  . meloxicam (MOBIC) 15 MG tablet TAKE 1 TABLET DAILY 90 tablet 3  . metFORMIN (GLUCOPHAGE-XR) 500 MG 24 hr tablet TAKE 2 TABLETS TWICE A DAY WITH A MEAL 180 tablet 7  . phenazopyridine (PYRIDIUM) 100  MG tablet Take 1 tablet (100 mg total) by mouth 3 (three) times daily as needed for pain. 10 tablet 0  . simvastatin (ZOCOR) 40 MG tablet Take 1 tablet (40 mg total) by mouth at bedtime. 90 tablet 1  . levothyroxine (SYNTHROID) 100 MCG tablet TAKE 1 TABLET DAILY 30 tablet 0   No facility-administered medications prior to visit.    Allergies  Allergen Reactions  . Celebrex [Celecoxib] Other (See Comments)    Patients reports weakness/ signs of stroke when she takes this.    Review of Systems  HENT: Positive for postnasal drip and sore throat.   Respiratory: Positive for cough.        Objective:    Physical Exam Vitals reviewed.  Constitutional:      Appearance: She is obese.  Eyes:     Extraocular Movements: Extraocular movements intact.  Cardiovascular:      Rate and Rhythm: Normal rate and regular rhythm.  Abdominal:     General: Bowel sounds are normal. There is distension.     Palpations: Abdomen is soft.  Musculoskeletal:        General: Normal range of motion.     Cervical back: Normal range of motion.  Neurological:     Mental Status: She is alert.     There were no vitals taken for this visit. Wt Readings from Last 3 Encounters:  09/21/19 185 lb (83.9 kg)  09/14/19 185 lb 3.2 oz (84 kg)  08/24/19 184 lb 12.8 oz (83.8 kg)    Health Maintenance Due  Topic Date Due  . Hepatitis C Screening  Never done  . INFLUENZA VACCINE  08/19/2019  . COVID-19 Vaccine (3 - Booster for Pfizer series) 11/21/2019  . URINE MICROALBUMIN  03/14/2020    There are no preventive care reminders to display for this patient.   Lab Results  Component Value Date   TSH 0.238 (L) 07/30/2019   Lab Results  Component Value Date   WBC 8.5 07/13/2019   HGB 14.2 07/13/2019   HCT 44.0 07/13/2019   MCV 89.8 07/13/2019   PLT 266 07/13/2019   Lab Results  Component Value Date   NA 141 07/30/2019   K 4.8 07/30/2019   CO2 21 07/30/2019   GLUCOSE 147 (H) 07/30/2019   BUN 18 07/30/2019   CREATININE 0.66 07/30/2019   BILITOT 0.3 07/30/2019   ALKPHOS 113 07/30/2019   AST 29 07/30/2019   ALT 50 (H) 07/30/2019   PROT 6.1 07/30/2019   ALBUMIN 4.2 07/30/2019   CALCIUM 9.5 07/30/2019   ANIONGAP 13 07/13/2019   Lab Results  Component Value Date   CHOL 238 (H) 03/15/2019   Lab Results  Component Value Date   HDL 37 (L) 03/15/2019   Lab Results  Component Value Date   LDLCALC 110 (H) 03/15/2019   Lab Results  Component Value Date   TRIG 525 (H) 03/15/2019   Lab Results  Component Value Date   CHOLHDL 6.4 (H) 03/15/2019   Lab Results  Component Value Date   HGBA1C 7.5 (A) 06/12/2019       Assessment & Plan:     Meds ordered this encounter  Medications  . levothyroxine (SYNTHROID) 100 MCG tablet    Sig: Take 1 tablet (100 mcg  total) by mouth daily.    Dispense:  90 tablet    Refill:  0     Grayce Sessions, NP

## 2020-02-01 ENCOUNTER — Ambulatory Visit (INDEPENDENT_AMBULATORY_CARE_PROVIDER_SITE_OTHER): Payer: Self-pay | Admitting: Primary Care

## 2020-02-01 ENCOUNTER — Other Ambulatory Visit: Payer: Self-pay | Admitting: Surgical

## 2020-02-01 ENCOUNTER — Encounter: Payer: Self-pay | Admitting: Physical Therapy

## 2020-02-01 ENCOUNTER — Encounter (INDEPENDENT_AMBULATORY_CARE_PROVIDER_SITE_OTHER): Payer: Self-pay | Admitting: Primary Care

## 2020-02-01 LAB — NOVEL CORONAVIRUS, NAA: SARS-CoV-2, NAA: DETECTED — AB

## 2020-02-01 MED ORDER — CYCLOBENZAPRINE HCL 10 MG PO TABS: 10.0000 mg | ORAL_TABLET | Freq: Three times a day (TID) | ORAL | 3 refills | Status: DC | PRN

## 2020-02-01 NOTE — Telephone Encounter (Signed)
Returned PT call asking for  information regarding COVID self care. Symptom onset 01/27/2020. Pt saw her PCP on 01/29/2020 and was swabbed at that time. Since then S/S have increased. C/O HA, BA, dizziness, sore throat, increasing cough and no appetite. . Pt may have had a fever but did not take her temp.   Pt has been taking "daytime" and "nighttime " cold medicine with limited relief.  Reminded pt to check labels on combination medications to insure she was not taking " double doses".   Pt states she has not appetite and is unsure whether to take her diabetes medication. Pt does not have a working glucometer.  Pt is very concerned about pneumonia.   Pt reminded not to take Ibuprofen with Meloxicam.   Pt advised to go to Urgent care or ED if she becomes SOB or has difficulty breathing.   Unable to schedule virtual appointment d/t no availability until February.  Will send note to PCP.  Reviewed care advice with pt.   Reason for Disposition . [1] CBSWH-67 diagnosed by positive lab test (e.g., PCR, rapid self-test kit) AND [2] mild symptoms (e.g., cough, fever, others) AND [5] no complications or SOB  Answer Assessment - Initial Assessment Questions 1. COVID-19 DIAGNOSIS: "Who made your COVID-19 diagnosis?" "Was it confirmed by a positive lab test?" If not diagnosed by a HCP, ask "Are there lots of cases (community spread) where you live?" Note: See public health department website, if unsure.     01/29/2020 - Test following office visit. 2. COVID-19 EXPOSURE: "Was there any known exposure to COVID before the symptoms began?" CDC Definition of close contact: within 6 feet (2 meters) for a total of 15 minutes or more over a 24-hour period.      no 3. ONSET: "When did the COVID-19 symptoms start?"      01/27/2020 4. WORST SYMPTOM: "What is your worst symptom?" (e.g., cough, fever, shortness of breath, muscle aches)     BA & Dizziness 5. COUGH: "Do you have a cough?" If Yes, ask: "How bad is the  cough?"       Yes mild but increasing 6. FEVER: "Do you have a fever?" If Yes, ask: "What is your temperature, how was it measured, and when did it start?"     May have had one - but did not take temp 7. RESPIRATORY STATUS: "Describe your breathing?" (e.g., shortness of breath, wheezing, unable to speak)      States her chest feels heavy. Not SOB. 8. BETTER-SAME-WORSE: "Are you getting better, staying the same or getting worse compared to yesterday?"  If getting worse, ask, "In what way?"     About the same Coughing is worsening 9. HIGH RISK DISEASE: "Do you have any chronic medical problems?" (e.g., asthma, heart or lung disease, weak immune system, obesity, etc.)     Diabetes, Fatty liver, Thyroid issue.  10. VACCINE: "Have you gotten the COVID-19 vaccine?" If Yes ask: "Which one, how many shots, when did you get it?"       2 second shot 05/2019 11. PREGNANCY: "Is there any chance you are pregnant?" "When was your last menstrual period?"       no 12. OTHER SYMPTOMS: "Do you have any other symptoms?"  (e.g., chills, fatigue, headache, loss of smell or taste, muscle pain, sore throat; new loss of smell or taste especially support the diagnosis of COVID-19)       Chills, BA, HA, Sore throat, Dizziness and no appetite.  Protocols  used: CORONAVIRUS (COVID-19) DIAGNOSED OR SUSPECTED-A-AH

## 2020-02-04 ENCOUNTER — Encounter (INDEPENDENT_AMBULATORY_CARE_PROVIDER_SITE_OTHER): Payer: Self-pay | Admitting: Primary Care

## 2020-02-04 ENCOUNTER — Encounter: Payer: Managed Care, Other (non HMO) | Admitting: Physical Therapy

## 2020-02-04 DIAGNOSIS — Z20822 Contact with and (suspected) exposure to covid-19: Secondary | ICD-10-CM

## 2020-02-10 MED ORDER — ALBUTEROL SULFATE HFA 108 (90 BASE) MCG/ACT IN AERS: 1.0000 | INHALATION_SPRAY | Freq: Four times a day (QID) | RESPIRATORY_TRACT | 1 refills | Status: DC | PRN

## 2020-02-11 ENCOUNTER — Ambulatory Visit: Payer: 59 | Admitting: Physical Therapy

## 2020-02-14 ENCOUNTER — Telehealth: Payer: 59 | Admitting: Nurse Practitioner

## 2020-02-14 DIAGNOSIS — U071 COVID-19: Secondary | ICD-10-CM

## 2020-02-14 MED ORDER — NAPROXEN 500 MG PO TABS: 500.0000 mg | ORAL_TABLET | Freq: Two times a day (BID) | ORAL | 1 refills | Status: DC

## 2020-02-14 MED ORDER — BENZONATATE 100 MG PO CAPS: 100.0000 mg | ORAL_CAPSULE | Freq: Three times a day (TID) | ORAL | 0 refills | Status: DC | PRN

## 2020-02-14 NOTE — Progress Notes (Signed)

## 2020-02-17 ENCOUNTER — Encounter (HOSPITAL_COMMUNITY): Payer: Self-pay

## 2020-02-17 ENCOUNTER — Observation Stay (HOSPITAL_COMMUNITY)
Admission: EM | Admit: 2020-02-17 | Discharge: 2020-02-20 | Disposition: A | Discharge status: Home / Self Care | Payer: 59 | Attending: Internal Medicine | Admitting: Internal Medicine

## 2020-02-17 ENCOUNTER — Emergency Department (HOSPITAL_COMMUNITY): Payer: 59

## 2020-02-17 ENCOUNTER — Other Ambulatory Visit: Payer: Self-pay

## 2020-02-17 DIAGNOSIS — Z79899 Other long term (current) drug therapy: Secondary | ICD-10-CM | POA: Insufficient documentation

## 2020-02-17 DIAGNOSIS — Z8616 Personal history of COVID-19: Secondary | ICD-10-CM | POA: Diagnosis not present

## 2020-02-17 DIAGNOSIS — Z794 Long term (current) use of insulin: Secondary | ICD-10-CM | POA: Diagnosis not present

## 2020-02-17 DIAGNOSIS — R0602 Shortness of breath: Principal | ICD-10-CM

## 2020-02-17 DIAGNOSIS — I3139 Other pericardial effusion (noninflammatory): Secondary | ICD-10-CM | POA: Diagnosis present

## 2020-02-17 DIAGNOSIS — E039 Hypothyroidism, unspecified: Secondary | ICD-10-CM

## 2020-02-17 DIAGNOSIS — Z7984 Long term (current) use of oral hypoglycemic drugs: Secondary | ICD-10-CM

## 2020-02-17 DIAGNOSIS — E119 Type 2 diabetes mellitus without complications: Secondary | ICD-10-CM | POA: Insufficient documentation

## 2020-02-17 DIAGNOSIS — Z888 Allergy status to other drugs, medicaments and biological substances status: Secondary | ICD-10-CM

## 2020-02-17 DIAGNOSIS — Z791 Long term (current) use of non-steroidal anti-inflammatories (NSAID): Secondary | ICD-10-CM

## 2020-02-17 DIAGNOSIS — Z79890 Hormone replacement therapy (postmenopausal): Secondary | ICD-10-CM

## 2020-02-17 DIAGNOSIS — I313 Pericardial effusion (noninflammatory): Secondary | ICD-10-CM | POA: Insufficient documentation

## 2020-02-17 DIAGNOSIS — Z8249 Family history of ischemic heart disease and other diseases of the circulatory system: Secondary | ICD-10-CM

## 2020-02-17 DIAGNOSIS — K760 Fatty (change of) liver, not elsewhere classified: Secondary | ICD-10-CM | POA: Diagnosis present

## 2020-02-17 DIAGNOSIS — U071 COVID-19: Secondary | ICD-10-CM

## 2020-02-17 DIAGNOSIS — R0789 Other chest pain: Secondary | ICD-10-CM | POA: Diagnosis present

## 2020-02-17 DIAGNOSIS — Z90710 Acquired absence of both cervix and uterus: Secondary | ICD-10-CM

## 2020-02-17 DIAGNOSIS — Z7982 Long term (current) use of aspirin: Secondary | ICD-10-CM

## 2020-02-17 DIAGNOSIS — R7401 Elevation of levels of liver transaminase levels: Secondary | ICD-10-CM

## 2020-02-17 DIAGNOSIS — E041 Nontoxic single thyroid nodule: Secondary | ICD-10-CM

## 2020-02-17 DIAGNOSIS — Z9049 Acquired absence of other specified parts of digestive tract: Secondary | ICD-10-CM

## 2020-02-17 DIAGNOSIS — I5033 Acute on chronic diastolic (congestive) heart failure: Secondary | ICD-10-CM | POA: Insufficient documentation

## 2020-02-17 DIAGNOSIS — E785 Hyperlipidemia, unspecified: Secondary | ICD-10-CM | POA: Diagnosis present

## 2020-02-17 DIAGNOSIS — Z833 Family history of diabetes mellitus: Secondary | ICD-10-CM

## 2020-02-17 LAB — COMPREHENSIVE METABOLIC PANEL
ALT: 81 U/L — ABNORMAL HIGH (ref 0–44)
AST: 53 U/L — ABNORMAL HIGH (ref 15–41)
Albumin: 4 g/dL (ref 3.5–5.0)
Alkaline Phosphatase: 114 U/L (ref 38–126)
Anion gap: 9 (ref 5–15)
BUN: 12 mg/dL (ref 6–20)
CO2: 22 mmol/L (ref 22–32)
Calcium: 9.4 mg/dL (ref 8.9–10.3)
Chloride: 107 mmol/L (ref 98–111)
Creatinine, Ser: 0.82 mg/dL (ref 0.44–1.00)
GFR, Estimated: >60 mL/min (ref >60)
Glucose, Bld: 187 mg/dL — ABNORMAL HIGH (ref 70–99)
Potassium: 3.7 mmol/L (ref 3.5–5.1)
Sodium: 138 mmol/L (ref 135–145)
Total Bilirubin: 1 mg/dL (ref 0.3–1.2)
Total Protein: 7 g/dL (ref 6.5–8.1)

## 2020-02-17 LAB — CBC WITH DIFFERENTIAL/PLATELET
Abs Immature Granulocytes: 0.05 10*3/uL (ref 0.00–0.07)
Basophils Absolute: 0.1 10*3/uL (ref 0.0–0.1)
Basophils Relative: 1 %
Eosinophils Absolute: 0.5 10*3/uL (ref 0.0–0.5)
Eosinophils Relative: 4 %
HCT: 41.3 % (ref 36.0–46.0)
Hemoglobin: 13.5 g/dL (ref 12.0–15.0)
Immature Granulocytes: 0 %
Lymphocytes Relative: 14 %
Lymphs Abs: 1.8 10*3/uL (ref 0.7–4.0)
MCH: 28.4 pg (ref 26.0–34.0)
MCHC: 32.7 g/dL (ref 30.0–36.0)
MCV: 86.9 fL (ref 80.0–100.0)
Monocytes Absolute: 0.7 10*3/uL (ref 0.1–1.0)
Monocytes Relative: 5 %
Neutro Abs: 9.8 10*3/uL — ABNORMAL HIGH (ref 1.7–7.7)
Neutrophils Relative %: 76 %
Platelets: 254 10*3/uL (ref 150–400)
RBC: 4.75 MIL/uL (ref 3.87–5.11)
RDW: 13.1 % (ref 11.5–15.5)
WBC: 12.8 10*3/uL — ABNORMAL HIGH (ref 4.0–10.5)
nRBC: 0 % (ref 0.0–0.2)

## 2020-02-17 LAB — BRAIN NATRIURETIC PEPTIDE: B Natriuretic Peptide: 56.6 pg/mL (ref 0.0–100.0)

## 2020-02-17 LAB — TROPONIN I (HIGH SENSITIVITY): Troponin I (High Sensitivity): 4 ng/L (ref <18)

## 2020-02-17 MED ORDER — IOHEXOL 350 MG/ML SOLN
100.0000 mL | Freq: Once | INTRAVENOUS | Status: AC | PRN
  Administered 2020-02-17: 100 mL via INTRAVENOUS

## 2020-02-17 NOTE — ED Triage Notes (Signed)
Patient complains of increased congestion and SOB related to covid diagnosis on 1/14. Nonsmoker. Reports increased pain with inspiration. Reports fever with same.

## 2020-02-17 NOTE — ED Provider Notes (Signed)
[facility] EMERGENCY DEPARTMENT Provider Note   CSN: [id number] Arrival date & time: 02/17/20  1620     History No chief complaint on file.   Whitney Thornton is a 55 y.o. female with a hx of insulin-dependent diabetes presents to the Emergency Department complaining of gradual, persistent, progressively worsening shortness of breath and central chest pain worse with exertion onset 3 days ago. Patient reports she was diagnosed with COVID-19 on 02/01/2020 but had symptoms starting on 01/27/2020. Patient reports over the last few weeks she has had nausea, decreased appetite, generalized weakness, headaches, myalgias and severe fatigue. She reports she did not develop shortness of breath until several days ago which has continued to worsen. She reports walking from the bathroom to the bedroom now makes her severely short of breath. She reports that takes several minutes after resting to regain her breath. She denies swelling in her legs. No history of blood clot. Patient is an insulin-dependent diabetic with blood sugars in the 160s. She reports no significant changes in her sugars. No history of CHF. No leg swelling. Patient denies significant cough, high fevers. No specific treatments prior to arrival.  The history is provided by the patient and medical records. No language interpreter was used.       Past Medical History:  Diagnosis Date  . Diabetes mellitus without complication (HCC)   . Nephritis     There are no problems to display for this patient.   Past Surgical History:  Procedure Laterality Date  . ABDOMINAL HYSTERECTOMY    . CESAREAN SECTION    . CHOLECYSTECTOMY    . KNEE SURGERY Bilateral   . SINUS EXPLORATION     sinus surgery     OB History   No obstetric history on file.     No family history on file.  Social History   Tobacco Use  . Smoking status: Never Smoker  . Smokeless tobacco: Never Used  Substance Use Topics  . Alcohol  use: Not Currently  . Drug use: Not Currently    Home Medications Prior to Admission medications   Medication Sig Start Date End Date Taking? Authorizing Provider  amoxicillin-clavulanate (AUGMENTIN) 875-125 MG tablet Take 1 tablet by mouth 2 (two) times daily. 06/21/19   Grayce Sessions, NP  aspirin EC 81 MG tablet Take 81 mg by mouth at bedtime.    [provider]  benzonatate (TESSALON PERLES) 100 MG capsule Take 1 capsule (100 mg total) by mouth 3 (three) times daily as needed for cough. 02/14/20   Daphine Deutscher, Mary-Margaret, FNP  ciprofloxacin (CIPRO) 500 MG tablet Take 1 tablet (500 mg total) by mouth 2 (two) times daily. 08/29/19   Grayce Sessions, NP  cyclobenzaprine (FLEXERIL) 10 MG tablet Take 1 tablet (10 mg total) by mouth 3 (three) times daily as needed for muscle spasms. 02/01/20   Magnant, Charles L, PA-C  diazepam (DIASTAT) 2.5 MG GEL Place 2.5 mg rectally once.    [provider]  diphenhydramine-acetaminophen (TYLENOL PM) 25-500 MG TABS tablet Take 1 tablet by mouth at bedtime as needed (sleep/pain).    [provider]  ezetimibe (ZETIA) 10 MG tablet Take 1 tablet (10 mg total) by mouth daily. 07/04/19   Grayce Sessions, NP  fluconazole (DIFLUCAN) 150 MG tablet Take 1 tablet (150 mg total) by mouth daily. Take after your complete your antibiotic prescription. 08/28/19   Grayce Sessions, NP  glipiZIDE (GLUCOTROL XL) 10 MG 24 hr tablet Take 1  tablet (10 mg total) by mouth 2 (two) times daily after a meal. 07/04/19   Grayce Sessions, NP  Insulin Glargine (BASAGLAR KWIKPEN) 100 UNIT/ML Inject 0.15 mLs (15 Units total) into the skin at bedtime. 07/04/19   Grayce Sessions, NP  levothyroxine (SYNTHROID) 100 MCG tablet Take 1 tablet (100 mcg total) by mouth daily. 01/29/20   Grayce Sessions, NP  meloxicam (MOBIC) 15 MG tablet TAKE 1 TABLET DAILY 12/17/19   Magnant, Charles L, PA-C  metFORMIN (GLUCOPHAGE-XR) 500 MG 24 hr tablet TAKE 2 TABLETS TWICE  A DAY WITH A MEAL 10/11/19   Grayce Sessions, NP  naproxen (NAPROSYN) 500 MG tablet Take 1 tablet (500 mg total) by mouth 2 (two) times daily with a meal. 02/14/20   Daphine Deutscher, Mary-Margaret, FNP  phenazopyridine (PYRIDIUM) 100 MG tablet Take 1 tablet (100 mg total) by mouth 3 (three) times daily as needed for pain. 10/03/19   Grayce Sessions, NP  simvastatin (ZOCOR) 40 MG tablet Take 1 tablet (40 mg total) by mouth at bedtime. 07/04/19   Grayce Sessions, NP  albuterol (VENTOLIN HFA) 108 (90 Base) MCG/ACT inhaler Inhale 1 puff into the lungs every 6 (six) hours as needed for wheezing or shortness of breath. 02/10/20 02/14/20  Grayce Sessions, NP    Allergies    Celebrex [celecoxib]  Review of Systems   Review of Systems  Constitutional: Positive for appetite change and fatigue. Negative for diaphoresis, fever and unexpected weight change.  HENT: Negative for mouth sores.   Eyes: Negative for visual disturbance.  Respiratory: Positive for chest tightness and shortness of breath. Negative for cough and wheezing.   Cardiovascular: Positive for chest pain.  Gastrointestinal: Positive for nausea. Negative for abdominal pain, constipation, diarrhea and vomiting.  Endocrine: Negative for polydipsia, polyphagia and polyuria.  Genitourinary: Negative for dysuria, frequency, hematuria and urgency.  Musculoskeletal: Positive for myalgias. Negative for back pain and neck stiffness.  Skin: Negative for rash.  Neurological: Negative for syncope, light-headedness and headaches.  Hematological: Does not bruise/bleed easily.  Psychiatric/Behavioral: Negative for sleep disturbance. The patient is not nervous/anxious.     Physical Exam Updated Vital Signs BP 133/62 (BP Location: Right Arm)   Pulse 82   Temp 97.6 F (36.4 C) (Oral)   Resp (!) 23   SpO2 95%   Physical Exam Vitals and nursing note reviewed.  Constitutional:      General: She is not in acute distress.    Appearance: She is  not diaphoretic.  HENT:     Head: Normocephalic.     Nose: Nose normal.     Mouth/Throat:     Mouth: Mucous membranes are moist.  Eyes:     General: No scleral icterus.    Conjunctiva/sclera: Conjunctivae normal.  Cardiovascular:     Rate and Rhythm: Normal rate and regular rhythm.     Pulses: Normal pulses.          Radial pulses are 2+ on the right side and 2+ on the left side.  Pulmonary:     Effort: Pulmonary effort is normal. No tachypnea, accessory muscle usage, prolonged expiration, respiratory distress or retractions.     Breath sounds: Normal breath sounds. No stridor.     Comments: Equal chest rise. No increased work of breathing. Abdominal:     General: There is no distension.     Palpations: Abdomen is soft.     Tenderness: There is no abdominal tenderness. There is no guarding or rebound.  Musculoskeletal:     Cervical back: Normal range of motion.     Comments: Moves all extremities equally and without difficulty. No pitting peripheral edema. No calf tenderness.  Skin:    General: Skin is warm and dry.     Capillary Refill: Capillary refill takes less than 2 seconds.  Neurological:     Mental Status: She is alert.     GCS: GCS eye subscore is 4. GCS verbal subscore is 5. GCS motor subscore is 6.     Comments: Speech is clear and goal oriented.  Psychiatric:        Mood and Affect: Mood normal.     ED Results / Procedures / Treatments   Labs (all labs ordered are listed, but only abnormal results are displayed) Labs Reviewed  COMPREHENSIVE METABOLIC PANEL - Abnormal; Notable for the following components:      Result Value   Glucose, Bld 187 (*)    AST 53 (*)    ALT 81 (*)    All other components within normal limits  CBC WITH DIFFERENTIAL/PLATELET - Abnormal; Notable for the following components:   WBC 12.8 (*)    Neutro Abs 9.8 (*)    All other components within normal limits  BRAIN NATRIURETIC PEPTIDE  TROPONIN I (HIGH SENSITIVITY)  TROPONIN I  (HIGH SENSITIVITY)    EKG EKG Interpretation  Date/Time:  Sunday February 17 2020 17:01:59 EST Ventricular Rate:  103 PR Interval:  168 QRS Duration: 102 QT Interval:  362 QTC Calculation: 474 R Axis:   66 Text Interpretation: Sinus tachycardia Nonspecific T wave abnormality Abnormal ECG Confirmed by Gerhard Munch [phone]) on 02/17/2020 11:14:25 PM   Radiology DG Chest 1 View  Result Date: 02/17/2020 CLINICAL DATA:  Increased congestion and shortness of breath since diagnosed with COVID-19 on 02/01/2020. EXAM: CHEST  1 VIEW COMPARISON:  01/25/2019 FINDINGS: Normal sized heart. Clear lungs with normal vascularity. Stable calcified granuloma at the right lung base. No acute bony abnormality. IMPRESSION: No acute abnormality. Electronically Signed   By: Beckie Salts M.D.   On: 02/17/2020 17:55   CT Angio Chest PE W and/or Wo Contrast  Result Date: 02/17/2020 CLINICAL DATA:  PE suspected.  History of COVID 3 weeks ago. EXAM: CT ANGIOGRAPHY CHEST WITH CONTRAST TECHNIQUE: Multidetector CT imaging of the chest was performed using the standard protocol during bolus administration of intravenous contrast. Multiplanar CT image reconstructions and MIPs were obtained to evaluate the vascular anatomy. CONTRAST:  OMNIPAQUE IOHEXOL 350 MG/ML SOLN COMPARISON:  None. FINDINGS: Cardiovascular: Contrast injection is sufficient to demonstrate satisfactory opacification of the pulmonary arteries to the segmental level. There is no pulmonary embolus or evidence of right heart strain. The size of the main pulmonary artery is normal. Heart size is mildly enlarged. There is a trace pericardial effusion. The course and caliber of the aorta are normal. There is no atherosclerotic calcification. Opacification decreased due to pulmonary arterial phase contrast bolus timing. Mediastinum/Nodes: -- No mediastinal lymphadenopathy. -- No hilar lymphadenopathy. -- No axillary lymphadenopathy. -- No supraclavicular  lymphadenopathy. --there is an apparent 2 cm thyroid nodule involving the inferior right thyroid gland. -  Unremarkable esophagus. Lungs/Pleura: There are trace bilateral pleural effusions. No pneumothorax. No large focal infiltrate. The trachea is unremarkable. Upper Abdomen: Contrast bolus timing is not optimized for evaluation of the abdominal organs. There is probable hepatic steatosis. The patient is status post prior cholecystectomy. The liver surface appears nodular. The partially visualized spleen appears enlarged. Musculoskeletal: No chest wall  abnormality. No bony spinal canal stenosis. Review of the MIP images confirms the above findings. IMPRESSION: 1. No evidence of pulmonary embolus. 2. Trace bilateral pleural effusions. 3. Mild cardiomegaly with a trace pericardial effusion. 4. A 2 cm thyroid nodule involving the inferior right thyroid gland. Follow-up with an outpatient thyroid ultrasound is.(Ref: J Am Coll Radiol. 2015 Feb;12(2): 143-50). 5. The partially visualized liver appears nodular suggestive of underlying cirrhosis. There is probable hepatic steatosis. The partially visualized spleen is enlarged suggestive of underlying portal hypertension. Electronically Signed   By: Katherine Mantle M.D.   On: 02/17/2020 23:20    Procedures Procedures   Medications Ordered in ED Medications  iohexol (OMNIPAQUE) 350 MG/ML injection 100 mL (100 mLs Intravenous Contrast Given 02/17/20 2307)    ED Course  I have reviewed the triage vital signs and the nursing notes.  Pertinent labs & imaging results that were available during my care of the patient were reviewed by me and considered in my medical decision making (see chart for details).    MDM Rules/Calculators/A&P                          Patient presents with onset exertional chest pain and shortness of breath in the setting of COVID-19 for the last 3 weeks.  Patient without history of same. Tachycardia on arrival.   Considering PE,  myocarditis, PNA, COVID complications.  Additional history obtained:  Previous records obtained and reviewed.    Lab Tests:  I Ordered, reviewed, and interpreted labs, which included:  CBC with mild leukocytosis BNP within normal CMP slightly elevated glucose -history of diabetes, mildly elevated AST and ALT Troponin within normal limits  ECG:  I personally viewed and interpreted the ECG obtained.  Sinus tachycardia with nonspecific T wave abnormalities but no acute ischemia   Imaging Studies ordered:  I have placed order for imaging including x-ray and CT angiogram. I personally reviewed and interpreted imaging.  X-ray without evidence of pneumonia, pneumothorax or groundglass opacities.  CT angiogram without consolidation, pulmonary embolism however mild cardiomegaly is noted with a moderate sized pericardial effusion.   ED Course:  Patient remained stable in the emergency department.  No tachycardia at rest however tachycardia with ambulation.  Additionally she becomes severely short of breath and develops chest pain with ambulation.  Concern for Covid myocarditis.  CT scan also noted some hepatomegaly and nodularity.  Patient reports 40 pound weight loss in the last year and night sweats for the last 3 weeks.  No history of cancer.  Patient will need admission for echocardiogram and further evaluation for the etiology of her pericardial effusion.  Patient voiced understanding and agreement wit the current medical evaluation and treatment plan.  Questions were answered to expressed satisfaction.     Portions of this note were generated with Scientist, clinical (histocompatibility and immunogenetics). Dictation errors may occur despite best attempts at proofreading.    Final Clinical Impression(s) / ED Diagnoses Final diagnoses:  COVID-19  Pericardial effusion  Elevated transaminase level    Rx / DC Orders ED Discharge Orders    None       Anetha Slagel, Boyd Kerbs 02/18/20 [id number]    Gerhard Munch,  MD 02/21/20 0001

## 2020-02-18 ENCOUNTER — Observation Stay (HOSPITAL_BASED_OUTPATIENT_CLINIC_OR_DEPARTMENT_OTHER): Payer: 59

## 2020-02-18 ENCOUNTER — Ambulatory Visit: Payer: 59 | Admitting: Physical Therapy

## 2020-02-18 ENCOUNTER — Encounter (HOSPITAL_COMMUNITY): Payer: Self-pay | Admitting: Internal Medicine

## 2020-02-18 DIAGNOSIS — I313 Pericardial effusion (noninflammatory): Secondary | ICD-10-CM

## 2020-02-18 DIAGNOSIS — Z8616 Personal history of COVID-19: Secondary | ICD-10-CM

## 2020-02-18 DIAGNOSIS — R0602 Shortness of breath: Secondary | ICD-10-CM

## 2020-02-18 DIAGNOSIS — E039 Hypothyroidism, unspecified: Secondary | ICD-10-CM

## 2020-02-18 DIAGNOSIS — E041 Nontoxic single thyroid nodule: Secondary | ICD-10-CM

## 2020-02-18 DIAGNOSIS — R7401 Elevation of levels of liver transaminase levels: Secondary | ICD-10-CM

## 2020-02-18 DIAGNOSIS — I3139 Other pericardial effusion (noninflammatory): Secondary | ICD-10-CM | POA: Diagnosis present

## 2020-02-18 LAB — COMPREHENSIVE METABOLIC PANEL
ALT: 67 U/L — ABNORMAL HIGH (ref 0–44)
AST: 36 U/L (ref 15–41)
Albumin: 3.5 g/dL (ref 3.5–5.0)
Alkaline Phosphatase: 96 U/L (ref 38–126)
Anion gap: 10 (ref 5–15)
BUN: 14 mg/dL (ref 6–20)
CO2: 21 mmol/L — ABNORMAL LOW (ref 22–32)
Calcium: 8.9 mg/dL (ref 8.9–10.3)
Chloride: 106 mmol/L (ref 98–111)
Creatinine, Ser: 0.66 mg/dL (ref 0.44–1.00)
GFR, Estimated: >60 mL/min (ref >60)
Glucose, Bld: 181 mg/dL — ABNORMAL HIGH (ref 70–99)
Potassium: 3.3 mmol/L — ABNORMAL LOW (ref 3.5–5.1)
Sodium: 137 mmol/L (ref 135–145)
Total Bilirubin: 1.1 mg/dL (ref 0.3–1.2)
Total Protein: 6.6 g/dL (ref 6.5–8.1)

## 2020-02-18 LAB — CBC
HCT: 36 % (ref 36.0–46.0)
Hemoglobin: 12.1 g/dL (ref 12.0–15.0)
MCH: 29.1 pg (ref 26.0–34.0)
MCHC: 33.6 g/dL (ref 30.0–36.0)
MCV: 86.5 fL (ref 80.0–100.0)
Platelets: 203 10*3/uL (ref 150–400)
RBC: 4.16 MIL/uL (ref 3.87–5.11)
RDW: 13.6 % (ref 11.5–15.5)
WBC: 7.4 10*3/uL (ref 4.0–10.5)
nRBC: 0 % (ref 0.0–0.2)

## 2020-02-18 LAB — GLUCOSE, CAPILLARY
Glucose-Capillary: 194 mg/dL — ABNORMAL HIGH (ref 70–99)
Glucose-Capillary: 207 mg/dL — ABNORMAL HIGH (ref 70–99)

## 2020-02-18 LAB — ECHOCARDIOGRAM COMPLETE
Area-P 1/2: 1.42 cm2
S' Lateral: 3.3 cm

## 2020-02-18 LAB — CBG MONITORING, ED
Glucose-Capillary: 163 mg/dL — ABNORMAL HIGH (ref 70–99)
Glucose-Capillary: 223 mg/dL — ABNORMAL HIGH (ref 70–99)
Glucose-Capillary: 177 mg/dL — ABNORMAL HIGH (ref 70–99)

## 2020-02-18 LAB — TROPONIN I (HIGH SENSITIVITY): Troponin I (High Sensitivity): 3 ng/L (ref <18)

## 2020-02-18 LAB — C-REACTIVE PROTEIN: CRP: 6.2 mg/dL — ABNORMAL HIGH (ref <1.0)

## 2020-02-18 LAB — SEDIMENTATION RATE: Sed Rate: 39 mm/hr — ABNORMAL HIGH (ref 0–22)

## 2020-02-18 MED ORDER — CYCLOBENZAPRINE HCL 10 MG PO TABS
10.0000 mg | ORAL_TABLET | Freq: Once | ORAL | Status: AC
  Administered 2020-02-18: 10 mg via ORAL
  Filled 2020-02-18: qty 1

## 2020-02-18 MED ORDER — SIMVASTATIN 20 MG PO TABS
40.0000 mg | ORAL_TABLET | Freq: Every day | ORAL | Status: DC
  Administered 2020-02-18 – 2020-02-19 (×2): 40 mg via ORAL
  Filled 2020-02-18 (×2): qty 2

## 2020-02-18 MED ORDER — ENOXAPARIN SODIUM 40 MG/0.4ML ~~LOC~~ SOLN
40.0000 mg | Freq: Every day | SUBCUTANEOUS | Status: DC
  Administered 2020-02-18 – 2020-02-20 (×3): 40 mg via SUBCUTANEOUS
  Filled 2020-02-18 (×3): qty 0.4

## 2020-02-18 MED ORDER — FUROSEMIDE 10 MG/ML IJ SOLN
40.0000 mg | Freq: Every day | INTRAMUSCULAR | Status: DC
  Administered 2020-02-18 – 2020-02-20 (×3): 40 mg via INTRAVENOUS
  Filled 2020-02-18 (×2): qty 4

## 2020-02-18 MED ORDER — FUROSEMIDE 10 MG/ML IJ SOLN
INTRAMUSCULAR | Status: AC
  Filled 2020-02-18: qty 4

## 2020-02-18 MED ORDER — IBUPROFEN 200 MG PO TABS
400.0000 mg | ORAL_TABLET | Freq: Four times a day (QID) | ORAL | Status: DC | PRN
  Administered 2020-02-18 – 2020-02-19 (×2): 400 mg via ORAL
  Filled 2020-02-18: qty 1
  Filled 2020-02-18 (×2): qty 2

## 2020-02-18 MED ORDER — LEVOTHYROXINE SODIUM 100 MCG PO TABS
100.0000 ug | ORAL_TABLET | Freq: Every day | ORAL | Status: DC
  Administered 2020-02-18 – 2020-02-20 (×3): 100 ug via ORAL
  Filled 2020-02-18 (×3): qty 1

## 2020-02-18 MED ORDER — ACETAMINOPHEN 325 MG PO TABS
650.0000 mg | ORAL_TABLET | Freq: Four times a day (QID) | ORAL | Status: DC | PRN
  Administered 2020-02-19: 650 mg via ORAL
  Filled 2020-02-18: qty 2

## 2020-02-18 MED ORDER — MELOXICAM 7.5 MG PO TABS
7.5000 mg | ORAL_TABLET | Freq: Every day | ORAL | Status: DC
  Administered 2020-02-18 – 2020-02-20 (×3): 7.5 mg via ORAL
  Filled 2020-02-18 (×3): qty 1

## 2020-02-18 MED ORDER — SODIUM CHLORIDE 0.9% FLUSH
3.0000 mL | Freq: Two times a day (BID) | INTRAVENOUS | Status: DC
  Administered 2020-02-18 – 2020-02-20 (×5): 3 mL via INTRAVENOUS

## 2020-02-18 MED ORDER — INSULIN ASPART 100 UNIT/ML ~~LOC~~ SOLN
0.0000 [IU] | Freq: Three times a day (TID) | SUBCUTANEOUS | Status: DC
  Administered 2020-02-18 (×2): 3 [IU] via SUBCUTANEOUS
  Administered 2020-02-18 – 2020-02-19 (×3): 5 [IU] via SUBCUTANEOUS
  Administered 2020-02-19: 3 [IU] via SUBCUTANEOUS
  Administered 2020-02-20: 5 [IU] via SUBCUTANEOUS

## 2020-02-18 MED ORDER — EZETIMIBE 10 MG PO TABS
10.0000 mg | ORAL_TABLET | Freq: Every day | ORAL | Status: DC
Start: 2020-02-18 — End: 2020-02-20
  Administered 2020-02-18 – 2020-02-20 (×3): 10 mg via ORAL
  Filled 2020-02-18 (×3): qty 1

## 2020-02-18 MED ORDER — POLYETHYLENE GLYCOL 3350 17 G PO PACK: 17.0000 g | PACK | Freq: Every day | ORAL | Status: DC | PRN

## 2020-02-18 NOTE — Plan of Care (Signed)
  Problem: Education: Goal: Knowledge of General Education information will improve Description Including pain rating scale, medication(s)/side effects and non-pharmacologic comfort measures Outcome: Progressing   

## 2020-02-18 NOTE — Progress Notes (Signed)
PROGRESS NOTE    Whitney Thornton  ZHG:[id number] DOB: 05-22-1965 DOA: 02/17/2020 PCP: Kerin Perna, NP    Brief Narrative:  55 y.o. female with medical history significant of diabetes, hypothyroidism, hyperlipidemia who presents with 3 days of persistent and progressive shortness of breath and chest pain.             Patient was diagnosed with COVID-19 on 1/14, but symptoms began on 1/9.  She has reported symptoms consisting of fever, nausea, decreased appetite, weakness, headache, fatigue, body aches.  These symptoms have improved but of also persisted.  She did not have any shortness of breath until the past 3 days.  Her shortness of breath has been persistent and progressive and associated with some central chest pain.             The pain and shortness of breath are worsened by exertion.  In the ED she was noted to get short of breath and tachycardic to the 120s with exertion, but did not require oxygen.  Patient states that after exertion takes her several minutes to catch her breath.  Assessment & Plan:   Principal Problem:   Pericardial effusion Active Problems:   Personal history of COVID-19   Transaminitis   Thyroid nodule   SOB (shortness of breath)   Hypothyroidism  Shortness of breath Pericardial effusion Recent COVID-19 infection > Covid symptoms began 1/9 and tested positive on 1/14.  She is now out of the window for precautions. > reports sob worse laying down, improved sitting up > Imaging noted trace pericardial effusions. > Mild leukocytosis of unclear etiology, possibly reactive - Monitor on telemetry - Check CRP and ESR mildly elevated - Echocardiogram reviewed with normal LVEF - NSAIDs for pain (continue home meloxicam) -Will cont pt on lasix. Follow daily wts and I/o  Diabetes > On oral medications and 15 units of long-acting daily at home, she states she has never take her insulin she does take it depending on how she feels.   -Cont on  SSI coverage -glucose trends thus far stable  Leukocytosis > White count 12.8 on admission > No sign of infection on chest CT, no urinary symptoms, possibly reactive - repeat CBC with normal WBC  Hypothyroidism Thyroid nodule - Thyroid nodule noted on chest CT incidentally, will need ultrasound follow-up if not already evaluated. - Continue home Synthroid as tolerated  Hyperlipidemia - Continue home atorvastatin and Zetia  Transaminitis Nodular appearance of liver > Patient with AST 53 and ALT 81 > This could be due to recent Covid infection or could represent liver damage in the setting of nodularity noted on visualized portion of liver from her chest CT. - Trend liver function tests  DVT prophylaxis: lovenox subq Code Status: Full Family Communication: Pt in room, family not at bedside  Status is: Observation  The patient will require care spanning > 2 midnights and should be moved to inpatient because: Unsafe d/c plan and Inpatient level of care appropriate due to severity of illness  Dispo: The patient is from: Home              Anticipated d/c is to: Home              Anticipated d/c date is: 2 days              Patient currently is not medically stable to d/c.   Difficult to place patient No       Consultants:  Procedures:     Antimicrobials: Anti-infectives (From admission, onward)   None       Subjective: Reports sob when laying flat, improved when sitting up  Objective: Vitals:   02/18/20 1530 02/18/20 1600 02/18/20 1630 02/18/20 1700  BP: 115/60 123/62 110/61 111/60  Pulse: 66 67 66 62  Resp: 20 (!) _0 Temp:      TempSrc:      SpO2: 91% 91% 92% 94%   No intake or output data in the 24 hours ending 02/18/20 1739 There were no vitals filed for this visit.  Examination: General exam: Awake, laying in bed, in nad Respiratory system: Normal respiratory effort, no wheezing Cardiovascular system: regular rate, s1,  s2 Gastrointestinal system: Soft, nondistended, positive BS Central nervous system: CN2-12 grossly intact, strength intact Extremities: Perfused, no clubbing Skin: Normal skin turgor, no notable skin lesions seen Psychiatry: Mood normal // no visual hallucinations    Data Reviewed: I have personally reviewed following labs and imaging studies  CBC: Recent Labs  Lab 02/17/20 1715 02/18/20 0228  WBC 12.8* 7.4  NEUTROABS 9.8*  --   HGB 13.5 12.1  HCT 41.3 36.0  MCV 86.9 86.5  PLT 254 863   Basic Metabolic Panel: Recent Labs  Lab 02/17/20 1715 02/18/20 0228  NA 138 137  K 3.7 3.3*  CL 107 106  CO2 22 21*  GLUCOSE 187* 181*  BUN 12 14  CREATININE 0.82 0.66  CALCIUM 9.4 8.9   GFR: CrCl cannot be calculated (Unknown ideal weight.). Liver Function Tests: Recent Labs  Lab 02/17/20 1715 02/18/20 0228  AST 53* 36  ALT 81* 67*  ALKPHOS 114 96  BILITOT 1.0 1.1  PROT 7.0 6.6  ALBUMIN 4.0 3.5   No results for input(s): LIPASE, AMYLASE in the last 168 hours. No results for input(s): AMMONIA in the last 168 hours. Coagulation Profile: No results for input(s): INR, PROTIME in the last 168 hours. Cardiac Enzymes: No results for input(s): CKTOTAL, CKMB, CKMBINDEX, TROPONINI in the last 168 hours. BNP (last 3 results) No results for input(s): PROBNP in the last 8760 hours. HbA1C: No results for input(s): HGBA1C in the last 72 hours. CBG: Recent Labs  Lab 02/18/20 0722 02/18/20 1202 02/18/20 1713  GLUCAP 163* 223* 177*   Lipid Profile: No results for input(s): CHOL, HDL, LDLCALC, TRIG, CHOLHDL, LDLDIRECT in the last 72 hours. Thyroid Function Tests: No results for input(s): TSH, T4TOTAL, FREET4, T3FREE, THYROIDAB in the last 72 hours. Anemia Panel: No results for input(s): VITAMINB12, FOLATE, FERRITIN, TIBC, IRON, RETICCTPCT in the last 72 hours. Sepsis Labs: No results for input(s): PROCALCITON, LATICACIDVEN in the last 168 hours.  No results found for this or  any previous visit (from the past 240 hour(s)).   Radiology Studies: DG Chest 1 View  Result Date: 02/17/2020 CLINICAL DATA:  Increased congestion and shortness of breath since diagnosed with COVID-19 on 02/01/2020. EXAM: CHEST  1 VIEW COMPARISON:  01/25/2019 FINDINGS: Normal sized heart. Clear lungs with normal vascularity. Stable calcified granuloma at the right lung base. No acute bony abnormality. IMPRESSION: No acute abnormality. Electronically Signed   By: Claudie Revering M.D.   On: 02/17/2020 17:55   CT Angio Chest PE W and/or Wo Contrast  Result Date: 02/17/2020 CLINICAL DATA:  PE suspected.  History of COVID 3 weeks ago. EXAM: CT ANGIOGRAPHY CHEST WITH CONTRAST TECHNIQUE: Multidetector CT imaging of the chest was performed using the standard protocol during bolus administration of intravenous contrast. Multiplanar CT  image reconstructions and MIPs were obtained to evaluate the vascular anatomy. CONTRAST:  164m OMNIPAQUE IOHEXOL 350 MG/ML SOLN COMPARISON:  None. FINDINGS: Cardiovascular: Contrast injection is sufficient to demonstrate satisfactory opacification of the pulmonary arteries to the segmental level. There is no pulmonary embolus or evidence of right heart strain. The size of the main pulmonary artery is normal. Heart size is mildly enlarged. There is a trace pericardial effusion. The course and caliber of the aorta are normal. There is no atherosclerotic calcification. Opacification decreased due to pulmonary arterial phase contrast bolus timing. Mediastinum/Nodes: -- No mediastinal lymphadenopathy. -- No hilar lymphadenopathy. -- No axillary lymphadenopathy. -- No supraclavicular lymphadenopathy. --there is an apparent 2 cm thyroid nodule involving the inferior right thyroid gland. -  Unremarkable esophagus. Lungs/Pleura: There are trace bilateral pleural effusions. No pneumothorax. No large focal infiltrate. The trachea is unremarkable. Upper Abdomen: Contrast bolus timing is not  optimized for evaluation of the abdominal organs. There is probable hepatic steatosis. The patient is status post prior cholecystectomy. The liver surface appears nodular. The partially visualized spleen appears enlarged. Musculoskeletal: No chest wall abnormality. No bony spinal canal stenosis. Review of the MIP images confirms the above findings. IMPRESSION: 1. No evidence of pulmonary embolus. 2. Trace bilateral pleural effusions. 3. Mild cardiomegaly with a trace pericardial effusion. 4. A 2 cm thyroid nodule involving the inferior right thyroid gland. Follow-up with an outpatient thyroid ultrasound is.(Ref: J Am Coll Radiol. 2015 Feb;12(2): 143-50). 5. The partially visualized liver appears nodular suggestive of underlying cirrhosis. There is probable hepatic steatosis. The partially visualized spleen is enlarged suggestive of underlying portal hypertension. Electronically Signed   By: CConstance HolsterM.D.   On: 02/17/2020 23:20   ECHOCARDIOGRAM COMPLETE  Result Date: 02/18/2020    ECHOCARDIOGRAM REPORT   Patient Name:   DBETHZAIDA BOORDDate of Exam: 02/18/2020 Medical Rec #:  [id number]                  Height:       66.0 in Accession #:    [id number]                 Weight:       185.0 lb Date of Birth:  [birth year]                   BSA:          1.935 m Patient Age:    89years                    BP:           113/61 mmHg Patient Gender: F                           HR:           72 bpm. Exam Location:  Inpatient Procedure: 2D Echo, Color Doppler and Cardiac Doppler Indications:    Pericardial effusion 423.9 / I31.3  History:        Patient has no prior history of Echocardiogram examinations.                 Risk Factors:Diabetes.  Sonographer:    SBernadene PersonRDCS Referring Phys: [id number] 1. Left ventricular ejection fraction, by estimation, is 60 to 65%. The left ventricle has normal function. The left ventricle has no regional wall motion abnormalities.  There is mild  concentric left ventricular hypertrophy. Left ventricular diastolic parameters are consistent with Grade I diastolic dysfunction (impaired relaxation).  2. Right ventricular systolic function is normal. The right ventricular size is normal.  3. The pericardial effusion is posterior to the left ventricle and anterior to the right ventricle.  4. The mitral valve is normal in structure. No evidence of mitral valve regurgitation. No evidence of mitral stenosis.  5. The aortic valve is normal in structure. Aortic valve regurgitation is not visualized. No aortic stenosis is present.  6. The inferior vena cava is normal in size with greater than 50% respiratory variability, suggesting right atrial pressure of 3 mmHg. FINDINGS  Left Ventricle: Left ventricular ejection fraction, by estimation, is 60 to 65%. The left ventricle has normal function. The left ventricle has no regional wall motion abnormalities. The left ventricular internal cavity size was normal in size. There is  mild concentric left ventricular hypertrophy. Left ventricular diastolic parameters are consistent with Grade I diastolic dysfunction (impaired relaxation). Normal left ventricular filling pressure. Right Ventricle: The right ventricular size is normal. No increase in right ventricular wall thickness. Right ventricular systolic function is normal. Left Atrium: Left atrial size was normal in size. Right Atrium: Right atrial size was normal in size. Pericardium: Trivial pericardial effusion is present. The pericardial effusion is posterior to the left ventricle and anterior to the right ventricle. Mitral Valve: The mitral valve is normal in structure. No evidence of mitral valve regurgitation. No evidence of mitral valve stenosis. Tricuspid Valve: The tricuspid valve is normal in structure. Tricuspid valve regurgitation is trivial. No evidence of tricuspid stenosis. Aortic Valve: The aortic valve is normal in structure. Aortic valve  regurgitation is not visualized. No aortic stenosis is present. Pulmonic Valve: The pulmonic valve was normal in structure. Pulmonic valve regurgitation is not visualized. No evidence of pulmonic stenosis. Aorta: The aortic root is normal in size and structure. Venous: The inferior vena cava is normal in size with greater than 50% respiratory variability, suggesting right atrial pressure of 3 mmHg. IAS/Shunts: No atrial level shunt detected by color flow Doppler.  LEFT VENTRICLE PLAX 2D LVIDd:         5.20 cm  Diastology LVIDs:         3.30 cm  LV e' medial:    7.33 cm/s LV PW:         1.20 cm  LV E/e' medial:  6.5 LV IVS:        1.10 cm  LV e' lateral:   11.70 cm/s LVOT diam:     2.00 cm  LV E/e' lateral: 4.1 LV SV:         61 LV SV Index:   32 LVOT Area:     3.14 cm  RIGHT VENTRICLE RV S prime:     12.40 cm/s TAPSE (M-mode): 2.1 cm LEFT ATRIUM             Index       RIGHT ATRIUM           Index LA diam:        3.80 cm 1.96 cm/m  RA Area:     11.10 cm LA Vol (A2C):   30.3 ml 15.66 ml/m RA Volume:   24.60 ml  12.72 ml/m LA Vol (A4C):   37.1 ml 19.18 ml/m LA Biplane Vol: 33.8 ml 17.47 ml/m  AORTIC VALVE LVOT Vmax:   92.90 cm/s LVOT Vmean:  67.200 cm/s LVOT VTI:    0.195 m  AORTA Ao Root diam: 3.20 cm Ao Asc diam:  2.90 cm MITRAL VALVE MV Area (PHT): 1.42 cm    SHUNTS MV Decel Time: 535 msec    Systemic VTI:  0.20 m MV E velocity: 48.00 cm/s  Systemic Diam: 2.00 cm MV A velocity: 60.70 cm/s MV E/A ratio:  0.79 Fransico Him MD Electronically signed by Fransico Him MD Signature Date/Time: 02/18/2020/10:41:26 AM    Final     Scheduled Meds: . enoxaparin (LOVENOX) injection  40 mg Subcutaneous Daily  . ezetimibe  10 mg Oral Daily  . insulin aspart  0-15 Units Subcutaneous TID WC  . levothyroxine  100 mcg Oral Daily  . meloxicam  7.5 mg Oral Daily  . simvastatin  40 mg Oral QHS  . sodium chloride flush  3 mL Intravenous Q12H   Continuous Infusions:   LOS: 0 days   Marylu Lund, MD [facility] Pager On Amion  If 7PM-7AM, please contact night-coverage 02/18/2020, 5:39 PM

## 2020-02-18 NOTE — H&P (Addendum)
History and Physical   [name] TOI:[id number] DOB: [birth year] DOA: 02/17/2020  PCP: Kerin Perna, NP   Patient coming from: Home  Chief Complaint: Shortness of breath and chest pain  HPI: Whitney Thornton is a 55 y.o. female with medical history significant of diabetes, hypothyroidism, hyperlipidemia who presents with 3 days of persistent and progressive shortness of breath and chest pain.  Patient was diagnosed with COVID-19 on 1/14, but symptoms began on 1/9.  She has reported symptoms consisting of fever, nausea, decreased appetite, weakness, headache, fatigue, body aches.  These symptoms have improved but of also persisted.  She did not have any shortness of breath until the past 3 days.  Her shortness of breath has been persistent and progressive and associated with some central chest pain.  The pain and shortness of breath are worsened by exertion.  In the ED she was noted to get short of breath and tachycardic to the 120s with exertion, but did not require oxygen.  Patient states that after exertion takes her several minutes to catch her breath.  ED Course: Vital signs in the ED significant for systolic blood pressure in the 90s to 130s, respiratory rate in the 20s at times, according to EDP would get tachycardic to the 120s with exertion.  Lab work-up showed BMP with glucose of 187, LFTs with AST 53, ALT 81.  CBC showed leukocytosis to 12.8.  Initial troponin IV with repeat pending.  BNP normal at 56.  Imaging showed chest x-ray without acute abnormality.  CTA showed no PE, trace bilateral effusions, mild cardiomegaly with trace pericardial effusion, 2 cm thyroid nodule with follow-up recommended, and visualized liver showed nodularity suggestive of cirrhosis.  Review of Systems: As per HPI otherwise all other systems reviewed and are negative.  Past Medical History:  Diagnosis Date  . Diabetes mellitus without complication ([place])   . Nephritis      Past Surgical History:  Procedure Laterality Date  . ABDOMINAL HYSTERECTOMY    . CESAREAN SECTION    . CHOLECYSTECTOMY    . KNEE SURGERY Bilateral   . SINUS EXPLORATION     sinus surgery    Social History  reports that she has never smoked. She has never used smokeless tobacco. She reports previous alcohol use. She reports previous drug use.  Allergies  Allergen Reactions  . Celebrex [Celecoxib] Other (See Comments)    Patients reports weakness/ signs of stroke when she takes this.    Family History  Problem Relation Age of Onset  . Hypothyroidism Mother   . Diabetes Mother   . Heart disease Father   . Heart disease Brother   Reviewed on admission  Prior to Admission medications   Medication Sig Start Date End Date Taking? Authorizing Provider  aspirin EC 81 MG tablet Take 81 mg by mouth at bedtime.    [provider]  benzonatate (TESSALON PERLES) 100 MG capsule Take 1 capsule (100 mg total) by mouth 3 (three) times daily as needed for cough. 02/14/20   Hassell Done Mary-Margaret, FNP  cyclobenzaprine (FLEXERIL) 10 MG tablet Take 1 tablet (10 mg total) by mouth 3 (three) times daily as needed for muscle spasms. 02/01/20   Magnant, Charles L, PA-C  diazepam (DIASTAT) 2.5 MG GEL Place 2.5 mg rectally once.    [provider]  diphenhydramine-acetaminophen (TYLENOL PM) 25-500 MG TABS tablet Take 1 tablet by mouth at bedtime as needed (sleep/pain).    [provider]  ezetimibe (ZETIA) 10 MG tablet  Take 1 tablet (10 mg total) by mouth daily. 07/04/19   Kerin Perna, NP  glipiZIDE (GLUCOTROL XL) 10 MG 24 hr tablet Take 1 tablet (10 mg total) by mouth 2 (two) times daily after a meal. 07/04/19   Kerin Perna, NP  Insulin Glargine (BASAGLAR KWIKPEN) 100 UNIT/ML Inject 0.15 mLs (15 Units total) into the skin at bedtime. 07/04/19   Kerin Perna, NP  levothyroxine (SYNTHROID) 100 MCG tablet Take 1 tablet (100 mcg total) by mouth daily. 01/29/20    Kerin Perna, NP  meloxicam (MOBIC) 15 MG tablet TAKE 1 TABLET DAILY 12/17/19   Magnant, Charles L, PA-C  metFORMIN (GLUCOPHAGE-XR) 500 MG 24 hr tablet TAKE 2 TABLETS TWICE A DAY WITH A MEAL 10/11/19   Kerin Perna, NP  naproxen (NAPROSYN) 500 MG tablet Take 1 tablet (500 mg total) by mouth 2 (two) times daily with a meal. 02/14/20   Hassell Done, Mary-Margaret, FNP  phenazopyridine (PYRIDIUM) 100 MG tablet Take 1 tablet (100 mg total) by mouth 3 (three) times daily as needed for pain. 10/03/19   Kerin Perna, NP  simvastatin (ZOCOR) 40 MG tablet Take 1 tablet (40 mg total) by mouth at bedtime. 07/04/19   Kerin Perna, NP  albuterol (VENTOLIN HFA) 108 (90 Base) MCG/ACT inhaler Inhale 1 puff into the lungs every 6 (six) hours as needed for wheezing or shortness of breath. 02/10/20 02/14/20  Kerin Perna, NP    Physical Exam: Vitals:   02/17/20 2345 02/18/20 0000 02/18/20 0015 02/18/20 0030  BP: 120/63 115/64 119/66 124/63  Pulse: 74 76 73 70  Resp: (!) $RemoveB'23 20 15 20  'HGcIjQRG$ Temp:      TempSrc:      SpO2: 93% 91% 93% 91%   Physical Exam Constitutional:      General: She is not in acute distress.    Appearance: Normal appearance.  HENT:     Head: Normocephalic and atraumatic.     Mouth/Throat:     Mouth: Mucous membranes are moist.     Pharynx: Oropharynx is clear.  Eyes:     Extraocular Movements: Extraocular movements intact.     Pupils: Pupils are equal, round, and reactive to light.  Cardiovascular:     Rate and Rhythm: Normal rate and regular rhythm.     Pulses: Normal pulses.     Heart sounds: Normal heart sounds.  Pulmonary:     Effort: Pulmonary effort is normal. No respiratory distress.     Breath sounds: Normal breath sounds.  Abdominal:     General: Bowel sounds are normal. There is no distension.     Palpations: Abdomen is soft.     Tenderness: There is no abdominal tenderness.  Musculoskeletal:        General: No swelling or deformity.     Comments:  Some tenderness at her left posterior chest wall  Skin:    General: Skin is warm and dry.  Neurological:     General: No focal deficit present.     Mental Status: Mental status is at baseline.    Labs on Admission: I have personally reviewed following labs and imaging studies  CBC: Recent Labs  Lab 02/17/20 1715  WBC 12.8*  NEUTROABS 9.8*  HGB 13.5  HCT 41.3  MCV 86.9  PLT 623    Basic Metabolic Panel: Recent Labs  Lab 02/17/20 1715  NA 138  K 3.7  CL 107  CO2 22  GLUCOSE 187*  BUN 12  CREATININE 0.82  CALCIUM 9.4    GFR: CrCl cannot be calculated (Unknown ideal weight.).  Liver Function Tests: Recent Labs  Lab 02/17/20 1715  AST 53*  ALT 81*  ALKPHOS 114  BILITOT 1.0  PROT 7.0  ALBUMIN 4.0    Urine analysis:    Component Value Date/Time   COLORURINE YELLOW 07/13/2019 1520   APPEARANCEUR HAZY (A) 07/13/2019 1520   LABSPEC 1.030 07/13/2019 1520   PHURINE 5.0 07/13/2019 1520   GLUCOSEU >=500 (A) 07/13/2019 1520   HGBUR NEGATIVE 07/13/2019 1520   BILIRUBINUR negative 09/14/2019 0915   BILIRUBINUR NEgative 03/15/2019 1144   KETONESUR negative 09/14/2019 0915   KETONESUR NEGATIVE 07/13/2019 1520   PROTEINUR NEGATIVE 07/13/2019 1520   UROBILINOGEN 2.0 (A) 09/14/2019 0915   NITRITE Negative 09/14/2019 0915   NITRITE POSITIVE (A) 07/13/2019 1520   LEUKOCYTESUR Negative 09/14/2019 0915   LEUKOCYTESUR TRACE (A) 07/13/2019 1520    Radiological Exams on Admission: DG Chest 1 View  Result Date: 02/17/2020 CLINICAL DATA:  Increased congestion and shortness of breath since diagnosed with COVID-19 on 02/01/2020. EXAM: CHEST  1 VIEW COMPARISON:  01/25/2019 FINDINGS: Normal sized heart. Clear lungs with normal vascularity. Stable calcified granuloma at the right lung base. No acute bony abnormality. IMPRESSION: No acute abnormality. Electronically Signed   By: Claudie Revering M.D.   On: 02/17/2020 17:55   CT Angio Chest PE W and/or Wo Contrast  Result Date:  02/17/2020 CLINICAL DATA:  PE suspected.  History of COVID 3 weeks ago. EXAM: CT ANGIOGRAPHY CHEST WITH CONTRAST TECHNIQUE: Multidetector CT imaging of the chest was performed using the standard protocol during bolus administration of intravenous contrast. Multiplanar CT image reconstructions and MIPs were obtained to evaluate the vascular anatomy. CONTRAST:  134mL OMNIPAQUE IOHEXOL 350 MG/ML SOLN COMPARISON:  None. FINDINGS: Cardiovascular: Contrast injection is sufficient to demonstrate satisfactory opacification of the pulmonary arteries to the segmental level. There is no pulmonary embolus or evidence of right heart strain. The size of the main pulmonary artery is normal. Heart size is mildly enlarged. There is a trace pericardial effusion. The course and caliber of the aorta are normal. There is no atherosclerotic calcification. Opacification decreased due to pulmonary arterial phase contrast bolus timing. Mediastinum/Nodes: -- No mediastinal lymphadenopathy. -- No hilar lymphadenopathy. -- No axillary lymphadenopathy. -- No supraclavicular lymphadenopathy. --there is an apparent 2 cm thyroid nodule involving the inferior right thyroid gland. -  Unremarkable esophagus. Lungs/Pleura: There are trace bilateral pleural effusions. No pneumothorax. No large focal infiltrate. The trachea is unremarkable. Upper Abdomen: Contrast bolus timing is not optimized for evaluation of the abdominal organs. There is probable hepatic steatosis. The patient is status post prior cholecystectomy. The liver surface appears nodular. The partially visualized spleen appears enlarged. Musculoskeletal: No chest wall abnormality. No bony spinal canal stenosis. Review of the MIP images confirms the above findings. IMPRESSION: 1. No evidence of pulmonary embolus. 2. Trace bilateral pleural effusions. 3. Mild cardiomegaly with a trace pericardial effusion. 4. A 2 cm thyroid nodule involving the inferior right thyroid gland. Follow-up with an  outpatient thyroid ultrasound is.(Ref: J Am Coll Radiol. 2015 Feb;12(2): 143-50). 5. The partially visualized liver appears nodular suggestive of underlying cirrhosis. There is probable hepatic steatosis. The partially visualized spleen is enlarged suggestive of underlying portal hypertension. Electronically Signed   By: Constance Holster M.D.   On: 02/17/2020 23:20   EKG: Independently reviewed.  Sinus tachycardia at 103 bpm.  Some nonspecific T wave abnormalities, largely some T wave flattening.  Assessment/Plan Principal Problem:   Pericardial effusion Active Problems:   Personal history of COVID-19   Transaminitis   Thyroid nodule   SOB (shortness of breath)   Hypothyroidism  Shortness of breath Pericardial effusion Recent COVID-19 infection > Covid symptoms began 1/9 and tested positive on 1/14.  She is now out of the window for precautions. > She has had several days of worsening persistent shortness of breath with some associated chest pain.  Became tachycardic with exertion in the ED. > Imaging noted trace pericardial effusions.  There is some concern for possible Covid related carditis. > Mild leukocytosis of unclear etiology, possibly reactive - Monitor on telemetry - Check CRP and ESR - Echocardiogram - NSAIDs for pain (continue home meloxicam)  Diabetes > On oral medications and 15 units of long-acting daily at home, she states she has never take her insulin she does take it depending on how she feels.  When she does take it she takes it in the evening. - SSI  Leukocytosis > White count 12.8 on admission > No sign of infection on chest CT, no urinary symptoms, possibly reactive - Trend CBC  Hypothyroidism Thyroid nodule - Thyroid nodule noted on chest CT incidentally, will need ultrasound follow-up if not already evaluated. - Continue home Synthroid  Hyperlipidemia - Continue home atorvastatin and Zetia  Transaminitis Nodular appearance of liver > Patient  with AST 53 and ALT 81 > This could be due to recent Covid infection or could represent liver damage in the setting of nodularity noted on visualized portion of liver from her chest CT. - May need additional abdominal angina imaging - Trend liver function  DVT prophylaxis: Lovenox Code Status:   Full  Family Communication:  None on admission   Disposition Plan:   Patient is from:  Home  Anticipated DC to:  Home  Anticipated DC date:  1 to 2 days  Anticipated DC barriers: None  Consults called:  None  Admission status:  Observation, telemetry   Severity of Illness: The appropriate patient status for this patient is OBSERVATION. Observation status is judged to be reasonable and necessary in order to provide the required intensity of service to ensure the patient's safety. The patient's presenting symptoms, physical exam findings, and initial radiographic and laboratory data in the context of their medical condition is felt to place them at decreased risk for further clinical deterioration. Furthermore, it is anticipated that the patient will be medically stable for discharge from the hospital within 2 midnights of admission. The following factors support the patient status of observation.   " The patient's presenting symptoms include chest pain, shortness of breath. " The physical exam findings include stable exam, some posterior left chest wall tenderness. " The initial radiographic and laboratory data are significant for CTA showing mild cardiomegaly, trace pericardial effusion, trace bilateral pleural effusions.Marcelyn Bruins MD [facility]  How to contact the [facility] Attending or Consulting provider [place] or covering provider during after hours [place], for this patient?   1. Check the care team in [facility] and look for a) attending/consulting [facility] provider listed and b) the [facility] team listed 2. Log into [email or web address] and use [place]'s universal password to access. If you do not  have the password, please contact the hospital operator. 3. Locate the [facility] provider you are looking for under [facility] and page to a number that you can be directly reached. 4. If you still have difficulty reaching the provider, please  page the [facility] (Director on Call) for the Hospitalists listed on amion for assistance.  02/18/2020, 1:10 AM

## 2020-02-18 NOTE — Progress Notes (Signed)
  Echocardiogram 2D Echocardiogram has been performed.  Augustine Radar 02/18/2020, 10:46 AM

## 2020-02-18 NOTE — ED Notes (Signed)
Breakfast ordered 

## 2020-02-19 DIAGNOSIS — E031 Congenital hypothyroidism without goiter: Secondary | ICD-10-CM

## 2020-02-19 DIAGNOSIS — R7401 Elevation of levels of liver transaminase levels: Secondary | ICD-10-CM | POA: Diagnosis not present

## 2020-02-19 DIAGNOSIS — I313 Pericardial effusion (noninflammatory): Secondary | ICD-10-CM | POA: Diagnosis not present

## 2020-02-19 LAB — GLUCOSE, CAPILLARY
Glucose-Capillary: 201 mg/dL — ABNORMAL HIGH (ref 70–99)
Glucose-Capillary: 220 mg/dL — ABNORMAL HIGH (ref 70–99)
Glucose-Capillary: 167 mg/dL — ABNORMAL HIGH (ref 70–99)
Glucose-Capillary: 169 mg/dL — ABNORMAL HIGH (ref 70–99)

## 2020-02-19 LAB — COMPREHENSIVE METABOLIC PANEL
ALT: 80 U/L — ABNORMAL HIGH (ref 0–44)
AST: 49 U/L — ABNORMAL HIGH (ref 15–41)
Albumin: 3.9 g/dL (ref 3.5–5.0)
Alkaline Phosphatase: 96 U/L (ref 38–126)
Anion gap: 12 (ref 5–15)
BUN: 12 mg/dL (ref 6–20)
CO2: 23 mmol/L (ref 22–32)
Calcium: 9.4 mg/dL (ref 8.9–10.3)
Chloride: 100 mmol/L (ref 98–111)
Creatinine, Ser: 0.81 mg/dL (ref 0.44–1.00)
GFR, Estimated: >60 mL/min (ref >60)
Glucose, Bld: 207 mg/dL — ABNORMAL HIGH (ref 70–99)
Potassium: 3.9 mmol/L (ref 3.5–5.1)
Sodium: 135 mmol/L (ref 135–145)
Total Bilirubin: 1.1 mg/dL (ref 0.3–1.2)
Total Protein: 6.8 g/dL (ref 6.5–8.1)

## 2020-02-19 LAB — CBC
HCT: 41.1 % (ref 36.0–46.0)
Hemoglobin: 13.3 g/dL (ref 12.0–15.0)
MCH: 28.7 pg (ref 26.0–34.0)
MCHC: 32.4 g/dL (ref 30.0–36.0)
MCV: 88.6 fL (ref 80.0–100.0)
Platelets: 207 10*3/uL (ref 150–400)
RBC: 4.64 MIL/uL (ref 3.87–5.11)
RDW: 13.5 % (ref 11.5–15.5)
WBC: 6.1 10*3/uL (ref 4.0–10.5)
nRBC: 0 % (ref 0.0–0.2)

## 2020-02-19 LAB — TROPONIN I (HIGH SENSITIVITY)
Troponin I (High Sensitivity): 3 ng/L (ref <18)
Troponin I (High Sensitivity): 5 ng/L (ref <18)

## 2020-02-19 LAB — MAGNESIUM: Magnesium: 1.7 mg/dL (ref 1.7–2.4)

## 2020-02-19 MED ORDER — KETOROLAC TROMETHAMINE 30 MG/ML IJ SOLN
30.0000 mg | Freq: Four times a day (QID) | INTRAMUSCULAR | Status: DC | PRN
  Administered 2020-02-19 (×2): 30 mg via INTRAVENOUS
  Filled 2020-02-19 (×2): qty 1

## 2020-02-19 MED ORDER — CYCLOBENZAPRINE HCL 5 MG PO TABS: 5.0000 mg | ORAL_TABLET | Freq: Three times a day (TID) | ORAL | Status: DC | PRN

## 2020-02-19 MED ORDER — INSULIN GLARGINE 100 UNIT/ML ~~LOC~~ SOLN
5.0000 [IU] | Freq: Every day | SUBCUTANEOUS | Status: DC
  Administered 2020-02-19 – 2020-02-20 (×2): 5 [IU] via SUBCUTANEOUS
  Filled 2020-02-19 (×2): qty 0.05

## 2020-02-19 NOTE — Progress Notes (Signed)
Inpatient Diabetes Program Recommendations  AACE/ADA: New Consensus Statement on Inpatient Glycemic Control   Target Ranges:  Prepandial:   less than 140 mg/dL      Peak postprandial:   less than 180 mg/dL (1-2 hours)      Critically ill patients:  140 - 180 mg/dL   Results for LAUREL, HARNDEN (MRN [id number]) as of 02/19/2020 10:04  Ref. Range 02/18/2020 07:22 02/18/2020 12:02 02/18/2020 17:13 02/18/2020 18:26 02/18/2020 21:06 02/19/2020 07:30  Glucose-Capillary Latest Ref Range: 70 - 99 mg/dL 841 (H) 660 (H) 630 (H) 194 (H) 207 (H) 201 (H)   Review of Glycemic Control  Diabetes history: DM2 Outpatient Diabetes medications: Glipizide XL 10 mg BID, Basaglar 15 units QHS if needed, Metformin XR 1000 mg BID Current orders for Inpatient glycemic control: Novolog 0-15 units TID with meals  Inpatient Diabetes Program Recommendations:    Insulin: Please consider ordering Novolog 0-5 units QHS for bedtime correction and Novolog 3 units TID with meals for meal coverage if patient eats at least 50% of meals.  Thanks, Orlando Penner, RN, MSN, CDE Diabetes Coordinator Inpatient Diabetes Program [phone] (Team Pager from 8am to 5pm)

## 2020-02-19 NOTE — Plan of Care (Signed)

## 2020-02-19 NOTE — Progress Notes (Signed)
PROGRESS NOTE    Whitney Thornton  YTK:[id number] DOB: Mar 24, 1965 DOA: 02/17/2020 PCP: Kerin Perna, NP    Brief Narrative:  55 y.o. female with medical history significant of diabetes, hypothyroidism, hyperlipidemia who presents with 3 days of persistent and progressive shortness of breath and chest pain.             Patient was diagnosed with COVID-19 on 1/14, but symptoms began on 1/9.  She has reported symptoms consisting of fever, nausea, decreased appetite, weakness, headache, fatigue, body aches.  These symptoms have improved but of also persisted.  She did not have any shortness of breath until the past 3 days.  Her shortness of breath has been persistent and progressive and associated with some central chest pain.             The pain and shortness of breath are worsened by exertion.  In the ED she was noted to get short of breath and tachycardic to the 120s with exertion, but did not require oxygen.  Patient states that after exertion takes her several minutes to catch her breath.  Assessment & Plan:   Principal Problem:   Pericardial effusion Active Problems:   Personal history of COVID-19   Transaminitis   Thyroid nodule   SOB (shortness of breath)   Hypothyroidism  Shortness of breath Pericardial effusion Recent COVID-19 infection, likely cardiomyopathy  > Covid symptoms began 1/9 and tested positive on 1/14.  She is now out of the window for precautions. > reports symptoms of orthopnea with sob worse laying down, improved sitting up > Imaging noted trace pericardial effusions. > Mild leukocytosis of unclear etiology, possibly reactive - Monitor on telemetry - CRP and ESR mildly elevated - Echocardiogram reviewed with normal LVEF - On NSAIDs for pain (continue home meloxicam) -Will cont pt on lasix, down 2kg this AM with very good urine output - Pt did complain of chest pain this AM. Will order troponin  Diabetes > On oral medications and 15  units of long-acting daily at home, she states she has never take her insulin she does take it depending on how she feels.   -Cont on SSI coverage -glucose trends in the 200's. Will start lantus 5 units scheduled daily  Leukocytosis > White count 12.8 on admission > No sign of infection on chest CT, no urinary symptoms, possibly reactive - WBC has since normalized  Hypothyroidism Thyroid nodule - Thyroid nodule noted on chest CT incidentally, will need ultrasound follow-up if not already evaluated. - Continue home Synthroid as tolerated  Hyperlipidemia - Continue home atorvastatin and Zetia as tolerated  Transaminitis Nodular appearance of liver > Patient with AST 53 and ALT 81 > This could be due to recent Covid infection or could represent liver damage in the setting of nodularity noted on visualized portion of liver from her chest CT. - Recheck LFT's in AM  DVT prophylaxis: lovenox subq Code Status: Full Family Communication: Pt in room, family not at bedside  Status is: Observation  The patient will require care spanning > 2 midnights and should be moved to inpatient because: Unsafe d/c plan and Inpatient level of care appropriate due to severity of illness  Dispo: The patient is from: Home              Anticipated d/c is to: Home              Anticipated d/c date is: 2 days  Patient currently is not medically stable to d/c.   Difficult to place patient No    Consultants:     Procedures:     Antimicrobials: Anti-infectives (From admission, onward)   None      Subjective: Still reports chest "tightness" this AM with symptoms extending to the back  Objective: Vitals:   02/19/20 0338 02/19/20 0552 02/19/20 0728 02/19/20 1611  BP:  (!) 99/59    Pulse:  69    Resp:  19    Temp:  (!) 97.5 F (36.4 C) 98.5 F (36.9 C) 98 F (36.7 C)  TempSrc:  Oral Oral Oral  SpO2:      Weight: 82.5 kg     Height:        Intake/Output Summary (Last  24 hours) at 02/19/2020 1620 Last data filed at 02/19/2020 1300 Gross per 24 hour  Intake 1440 ml  Output 3600 ml  Net -2160 ml   Filed Weights   02/18/20 1838 02/19/20 0338  Weight: 84.5 kg 82.5 kg    Examination: General exam: Conversant, in no acute distress Respiratory system: normal chest rise, clear, no audible wheezing Cardiovascular system: regular rhythm, s1-s2 Gastrointestinal system: Nondistended, nontender, pos BS Central nervous system: No seizures, no tremors Extremities: No cyanosis, no joint deformities Skin: No rashes, no pallor Psychiatry: Affect normal // no auditory hallucinations   Data Reviewed: I have personally reviewed following labs and imaging studies  CBC: Recent Labs  Lab 02/17/20 1715 02/18/20 0228 02/19/20 0238  WBC 12.8* 7.4 6.1  NEUTROABS 9.8*  --   --   HGB 13.5 12.1 13.3  HCT 41.3 36.0 41.1  MCV 86.9 86.5 88.6  PLT 254 203 229   Basic Metabolic Panel: Recent Labs  Lab 02/17/20 1715 02/18/20 0228 02/19/20 0238  NA 138 137 135  K 3.7 3.3* 3.9  CL 107 106 100  CO2 22 21* 23  GLUCOSE 187* 181* 207*  BUN $Re'12 14 12  'GkV$ CREATININE 0.82 0.66 0.81  CALCIUM 9.4 8.9 9.4  MG  --   --  1.7   GFR: Estimated Creatinine Clearance: 87 mL/min (by C-G formula based on SCr of 0.81 mg/dL). Liver Function Tests: Recent Labs  Lab 02/17/20 1715 02/18/20 0228 02/19/20 0238  AST 53* 36 49*  ALT 81* 67* 80*  ALKPHOS 114 96 96  BILITOT 1.0 1.1 1.1  PROT 7.0 6.6 6.8  ALBUMIN 4.0 3.5 3.9   No results for input(s): LIPASE, AMYLASE in the last 168 hours. No results for input(s): AMMONIA in the last 168 hours. Coagulation Profile: No results for input(s): INR, PROTIME in the last 168 hours. Cardiac Enzymes: No results for input(s): CKTOTAL, CKMB, CKMBINDEX, TROPONINI in the last 168 hours. BNP (last 3 results) No results for input(s): PROBNP in the last 8760 hours. HbA1C: No results for input(s): HGBA1C in the last 72 hours. CBG: Recent Labs   Lab 02/18/20 1826 02/18/20 2106 02/19/20 0730 02/19/20 1120 02/19/20 1608  GLUCAP 194* 207* 201* 220* 167*   Lipid Profile: No results for input(s): CHOL, HDL, LDLCALC, TRIG, CHOLHDL, LDLDIRECT in the last 72 hours. Thyroid Function Tests: No results for input(s): TSH, T4TOTAL, FREET4, T3FREE, THYROIDAB in the last 72 hours. Anemia Panel: No results for input(s): VITAMINB12, FOLATE, FERRITIN, TIBC, IRON, RETICCTPCT in the last 72 hours. Sepsis Labs: No results for input(s): PROCALCITON, LATICACIDVEN in the last 168 hours.  No results found for this or any previous visit (from the past 240 hour(s)).   Radiology Studies:  DG Chest 1 View  Result Date: 02/17/2020 CLINICAL DATA:  Increased congestion and shortness of breath since diagnosed with COVID-19 on 02/01/2020. EXAM: CHEST  1 VIEW COMPARISON:  01/25/2019 FINDINGS: Normal sized heart. Clear lungs with normal vascularity. Stable calcified granuloma at the right lung base. No acute bony abnormality. IMPRESSION: No acute abnormality. Electronically Signed   By: Claudie Revering M.D.   On: 02/17/2020 17:55   CT Angio Chest PE W and/or Wo Contrast  Result Date: 02/17/2020 CLINICAL DATA:  PE suspected.  History of COVID 3 weeks ago. EXAM: CT ANGIOGRAPHY CHEST WITH CONTRAST TECHNIQUE: Multidetector CT imaging of the chest was performed using the standard protocol during bolus administration of intravenous contrast. Multiplanar CT image reconstructions and MIPs were obtained to evaluate the vascular anatomy. CONTRAST:  177mL OMNIPAQUE IOHEXOL 350 MG/ML SOLN COMPARISON:  None. FINDINGS: Cardiovascular: Contrast injection is sufficient to demonstrate satisfactory opacification of the pulmonary arteries to the segmental level. There is no pulmonary embolus or evidence of right heart strain. The size of the main pulmonary artery is normal. Heart size is mildly enlarged. There is a trace pericardial effusion. The course and caliber of the aorta are  normal. There is no atherosclerotic calcification. Opacification decreased due to pulmonary arterial phase contrast bolus timing. Mediastinum/Nodes: -- No mediastinal lymphadenopathy. -- No hilar lymphadenopathy. -- No axillary lymphadenopathy. -- No supraclavicular lymphadenopathy. --there is an apparent 2 cm thyroid nodule involving the inferior right thyroid gland. -  Unremarkable esophagus. Lungs/Pleura: There are trace bilateral pleural effusions. No pneumothorax. No large focal infiltrate. The trachea is unremarkable. Upper Abdomen: Contrast bolus timing is not optimized for evaluation of the abdominal organs. There is probable hepatic steatosis. The patient is status post prior cholecystectomy. The liver surface appears nodular. The partially visualized spleen appears enlarged. Musculoskeletal: No chest wall abnormality. No bony spinal canal stenosis. Review of the MIP images confirms the above findings. IMPRESSION: 1. No evidence of pulmonary embolus. 2. Trace bilateral pleural effusions. 3. Mild cardiomegaly with a trace pericardial effusion. 4. A 2 cm thyroid nodule involving the inferior right thyroid gland. Follow-up with an outpatient thyroid ultrasound is.(Ref: J Am Coll Radiol. 2015 Feb;12(2): 143-50). 5. The partially visualized liver appears nodular suggestive of underlying cirrhosis. There is probable hepatic steatosis. The partially visualized spleen is enlarged suggestive of underlying portal hypertension. Electronically Signed   By: Constance Holster M.D.   On: 02/17/2020 23:20   ECHOCARDIOGRAM COMPLETE  Result Date: 02/18/2020    ECHOCARDIOGRAM REPORT   Patient Name:   SHELA ESSES Date of Exam: 02/18/2020 Medical Rec #:  [id number]                   Height:       66.0 in Accession #:    [id number]                  Weight:       185.0 lb Date of Birth:  [birth year]                    BSA:          1.935 m Patient Age:    7 years                    BP:           113/61 mmHg  Patient Gender: F  HR:           72 bpm. Exam Location:  Inpatient Procedure: 2D Echo, Color Doppler and Cardiac Doppler Indications:    Pericardial effusion 423.9 / I31.3  History:        Patient has no prior history of Echocardiogram examinations.                 Risk Factors:Diabetes.  Sonographer:    Bernadene Person RDCS Referring Phys: [id number] [place]  1. Left ventricular ejection fraction, by estimation, is 60 to 65%. The left ventricle has normal function. The left ventricle has no regional wall motion abnormalities. There is mild concentric left ventricular hypertrophy. Left ventricular diastolic parameters are consistent with Grade I diastolic dysfunction (impaired relaxation).  2. Right ventricular systolic function is normal. The right ventricular size is normal.  3. The pericardial effusion is posterior to the left ventricle and anterior to the right ventricle.  4. The mitral valve is normal in structure. No evidence of mitral valve regurgitation. No evidence of mitral stenosis.  5. The aortic valve is normal in structure. Aortic valve regurgitation is not visualized. No aortic stenosis is present.  6. The inferior vena cava is normal in size with greater than 50% respiratory variability, suggesting right atrial pressure of 3 mmHg. FINDINGS  Left Ventricle: Left ventricular ejection fraction, by estimation, is 60 to 65%. The left ventricle has normal function. The left ventricle has no regional wall motion abnormalities. The left ventricular internal cavity size was normal in size. There is  mild concentric left ventricular hypertrophy. Left ventricular diastolic parameters are consistent with Grade I diastolic dysfunction (impaired relaxation). Normal left ventricular filling pressure. Right Ventricle: The right ventricular size is normal. No increase in right ventricular wall thickness. Right ventricular systolic function is normal. Left Atrium: Left  atrial size was normal in size. Right Atrium: Right atrial size was normal in size. Pericardium: Trivial pericardial effusion is present. The pericardial effusion is posterior to the left ventricle and anterior to the right ventricle. Mitral Valve: The mitral valve is normal in structure. No evidence of mitral valve regurgitation. No evidence of mitral valve stenosis. Tricuspid Valve: The tricuspid valve is normal in structure. Tricuspid valve regurgitation is trivial. No evidence of tricuspid stenosis. Aortic Valve: The aortic valve is normal in structure. Aortic valve regurgitation is not visualized. No aortic stenosis is present. Pulmonic Valve: The pulmonic valve was normal in structure. Pulmonic valve regurgitation is not visualized. No evidence of pulmonic stenosis. Aorta: The aortic root is normal in size and structure. Venous: The inferior vena cava is normal in size with greater than 50% respiratory variability, suggesting right atrial pressure of 3 mmHg. IAS/Shunts: No atrial level shunt detected by color flow Doppler.  LEFT VENTRICLE PLAX 2D LVIDd:         5.20 cm  Diastology LVIDs:         3.30 cm  LV e' medial:    7.33 cm/s LV PW:         1.20 cm  LV E/e' medial:  6.5 LV IVS:        1.10 cm  LV e' lateral:   11.70 cm/s LVOT diam:     2.00 cm  LV E/e' lateral: 4.1 LV SV:         61 LV SV Index:   32 LVOT Area:     3.14 cm  RIGHT VENTRICLE RV S prime:     12.40 cm/s TAPSE (M-mode):  2.1 cm LEFT ATRIUM             Index       RIGHT ATRIUM           Index LA diam:        3.80 cm 1.96 cm/m  RA Area:     11.10 cm LA Vol (A2C):   30.3 ml 15.66 ml/m RA Volume:   24.60 ml  12.72 ml/m LA Vol (A4C):   37.1 ml 19.18 ml/m LA Biplane Vol: 33.8 ml 17.47 ml/m  AORTIC VALVE LVOT Vmax:   92.90 cm/s LVOT Vmean:  67.200 cm/s LVOT VTI:    0.195 m  AORTA Ao Root diam: 3.20 cm Ao Asc diam:  2.90 cm MITRAL VALVE MV Area (PHT): 1.42 cm    SHUNTS MV Decel Time: 535 msec    Systemic VTI:  0.20 m MV E velocity: 48.00 cm/s   Systemic Diam: 2.00 cm MV A velocity: 60.70 cm/s MV E/A ratio:  0.79 Fransico Him MD Electronically signed by Fransico Him MD Signature Date/Time: 02/18/2020/10:41:26 AM    Final     Scheduled Meds: . enoxaparin (LOVENOX) injection  40 mg Subcutaneous Daily  . ezetimibe  10 mg Oral Daily  . furosemide  40 mg Intravenous Daily  . insulin aspart  0-15 Units Subcutaneous TID WC  . levothyroxine  100 mcg Oral Daily  . meloxicam  7.5 mg Oral Daily  . simvastatin  40 mg Oral QHS  . sodium chloride flush  3 mL Intravenous Q12H   Continuous Infusions:   LOS: 0 days   Marylu Lund, MD [facility] Pager On Amion  If 7PM-7AM, please contact night-coverage 02/19/2020, 4:20 PM

## 2020-02-20 DIAGNOSIS — R0602 Shortness of breath: Secondary | ICD-10-CM

## 2020-02-20 DIAGNOSIS — Z7982 Long term (current) use of aspirin: Secondary | ICD-10-CM | POA: Diagnosis not present

## 2020-02-20 DIAGNOSIS — Z79890 Hormone replacement therapy (postmenopausal): Secondary | ICD-10-CM | POA: Diagnosis not present

## 2020-02-20 DIAGNOSIS — Z9049 Acquired absence of other specified parts of digestive tract: Secondary | ICD-10-CM | POA: Diagnosis not present

## 2020-02-20 DIAGNOSIS — E039 Hypothyroidism, unspecified: Secondary | ICD-10-CM | POA: Diagnosis present

## 2020-02-20 DIAGNOSIS — K760 Fatty (change of) liver, not elsewhere classified: Secondary | ICD-10-CM | POA: Diagnosis present

## 2020-02-20 DIAGNOSIS — Z791 Long term (current) use of non-steroidal anti-inflammatories (NSAID): Secondary | ICD-10-CM | POA: Diagnosis not present

## 2020-02-20 DIAGNOSIS — Z8249 Family history of ischemic heart disease and other diseases of the circulatory system: Secondary | ICD-10-CM | POA: Diagnosis not present

## 2020-02-20 DIAGNOSIS — R7401 Elevation of levels of liver transaminase levels: Secondary | ICD-10-CM | POA: Diagnosis not present

## 2020-02-20 DIAGNOSIS — Z8616 Personal history of COVID-19: Secondary | ICD-10-CM | POA: Diagnosis not present

## 2020-02-20 DIAGNOSIS — Z79899 Other long term (current) drug therapy: Secondary | ICD-10-CM | POA: Diagnosis not present

## 2020-02-20 DIAGNOSIS — E785 Hyperlipidemia, unspecified: Secondary | ICD-10-CM | POA: Diagnosis present

## 2020-02-20 DIAGNOSIS — R0789 Other chest pain: Secondary | ICD-10-CM | POA: Diagnosis present

## 2020-02-20 DIAGNOSIS — Z7984 Long term (current) use of oral hypoglycemic drugs: Secondary | ICD-10-CM | POA: Diagnosis not present

## 2020-02-20 DIAGNOSIS — Z833 Family history of diabetes mellitus: Secondary | ICD-10-CM | POA: Diagnosis not present

## 2020-02-20 DIAGNOSIS — Z888 Allergy status to other drugs, medicaments and biological substances status: Secondary | ICD-10-CM | POA: Diagnosis not present

## 2020-02-20 DIAGNOSIS — E041 Nontoxic single thyroid nodule: Secondary | ICD-10-CM

## 2020-02-20 DIAGNOSIS — E119 Type 2 diabetes mellitus without complications: Secondary | ICD-10-CM | POA: Diagnosis present

## 2020-02-20 DIAGNOSIS — Z90710 Acquired absence of both cervix and uterus: Secondary | ICD-10-CM | POA: Diagnosis not present

## 2020-02-20 DIAGNOSIS — I313 Pericardial effusion (noninflammatory): Secondary | ICD-10-CM | POA: Diagnosis not present

## 2020-02-20 LAB — COMPREHENSIVE METABOLIC PANEL
ALT: 71 U/L — ABNORMAL HIGH (ref 0–44)
AST: 38 U/L (ref 15–41)
Albumin: 3.8 g/dL (ref 3.5–5.0)
Alkaline Phosphatase: 102 U/L (ref 38–126)
Anion gap: 12 (ref 5–15)
BUN: 20 mg/dL (ref 6–20)
CO2: 24 mmol/L (ref 22–32)
Calcium: 9.4 mg/dL (ref 8.9–10.3)
Chloride: 99 mmol/L (ref 98–111)
Creatinine, Ser: 0.95 mg/dL (ref 0.44–1.00)
GFR, Estimated: >60 mL/min (ref >60)
Glucose, Bld: 202 mg/dL — ABNORMAL HIGH (ref 70–99)
Potassium: 3.5 mmol/L (ref 3.5–5.1)
Sodium: 135 mmol/L (ref 135–145)
Total Bilirubin: 1.2 mg/dL (ref 0.3–1.2)
Total Protein: 6.7 g/dL (ref 6.5–8.1)

## 2020-02-20 LAB — GLUCOSE, CAPILLARY: Glucose-Capillary: 212 mg/dL — ABNORMAL HIGH (ref 70–99)

## 2020-02-20 MED ORDER — FUROSEMIDE 40 MG PO TABS: 40.0000 mg | ORAL_TABLET | Freq: Every day | ORAL | 1 refills | Status: DC | PRN

## 2020-02-20 MED ORDER — POTASSIUM CHLORIDE CRYS ER 20 MEQ PO TBCR
40.0000 meq | EXTENDED_RELEASE_TABLET | Freq: Once | ORAL | Status: AC
  Administered 2020-02-20: 40 meq via ORAL
  Filled 2020-02-20: qty 2

## 2020-02-20 MED ORDER — POTASSIUM CHLORIDE CRYS ER 20 MEQ PO TBCR: 20.0000 meq | EXTENDED_RELEASE_TABLET | Freq: Every day | ORAL | 1 refills | Status: DC | PRN

## 2020-02-20 NOTE — Discharge Summary (Signed)
Physician Discharge Summary  Whitney Thornton OPF:[id number] DOB: 1965-09-04 DOA: 02/17/2020  PCP: Whitney Sessions, NP  Admit date: 02/17/2020 Discharge date: 02/20/2020  Admitted From: home Disposition:  home  Recommendations for Outpatient Follow-up:  1. Follow up with PCP in 1-2 weeks 2. Follow up with cardiology in 2 weeks  Home Health: none Equipment/Devices: none  Discharge Condition: stable CODE STATUS: Full code Diet recommendation: low sodium   HPI: Per admitting MD, Whitney Thornton is a 55 y.o. female with medical history significant of diabetes, hypothyroidism, hyperlipidemia who presents with 3 days of persistent and progressive shortness of breath and chest pain. Patient was diagnosed with COVID-19 on 1/14, but symptoms began on 1/9.  She has reported symptoms consisting of fever, nausea, decreased appetite, weakness, headache, fatigue, body aches.  These symptoms have improved but of also persisted.  She did not have any shortness of breath until the past 3 days.  Her shortness of breath has been persistent and progressive and associated with some central chest pain. The pain and shortness of breath are worsened by exertion.  In the ED she was noted to get short of breath and tachycardic to the 120s with exertion, but did not require oxygen.  Patient states that after exertion takes her several minutes to catch her breath.  Hospital Course / Discharge diagnoses: Principal problem Shortness of breath Pericardial effusion Recent COVID-19 infection, likely cardiomyopathy  Acute on chronic diastolic CHF -Patient was admitted to the hospital with mild fluid overload in the setting of acute on chronic diastolic CHF.  She was placed on IV Lasix, with significant improvement in her breathing.  She is on room air, able to ambulate without difficulties, shortness of breath is improved, will be transitioned to oral Lasix and discharged home in stable  condition.  She will need to outpatient follow-up with cardiology.  She was advised for daily weights, low-sodium diet  Active problems Diabetes mellitus -resume home medications Leukocytosis Hypothyroidism Thyroid nodule -Thyroid nodule noted on chest CT incidentally, will need ultrasound follow-up if not already evaluated. Continue home Synthroid Hyperlipidemia -Continue home atorvastatin and Zetia as tolerated Transaminitis Nodular appearance of liver -This could be due to recent Covid infection or could represent liver damage in the setting of nodularity noted on visualized portion of liver from her chest CT. LFTs stable. Outpatient follow up  Sepsis ruled out  Discharge Instructions  Allergies as of 02/20/2020      Reactions   Celebrex [celecoxib] Other (See Comments)   Patients reports weakness/ signs of stroke when she takes this.      Medication List    TAKE these medications   aspirin EC 81 MG tablet Take 81 mg by mouth at bedtime.   Basaglar KwikPen 100 UNIT/ML Inject 0.15 mLs (15 Units total) into the skin at bedtime. What changed:   when to take this  reasons to take this   cyclobenzaprine 10 MG tablet Commonly known as: FLEXERIL Take 1 tablet (10 mg total) by mouth 3 (three) times daily as needed for muscle spasms.   diphenhydramine-acetaminophen 25-500 MG Tabs tablet Commonly known as: TYLENOL PM Take 1 tablet by mouth at bedtime as needed (sleep/pain).   ezetimibe 10 MG tablet Commonly known as: Zetia Take 1 tablet (10 mg total) by mouth daily.   furosemide 40 MG tablet Commonly known as: Lasix Take 1 tablet (40 mg total) by mouth daily as needed for fluid or edema.   glipiZIDE 10 MG 24 hr tablet  Commonly known as: GLUCOTROL XL Take 1 tablet (10 mg total) by mouth 2 (two) times daily after a meal.   levothyroxine 100 MCG tablet Commonly known as: SYNTHROID Take 1 tablet (100 mcg total) by mouth daily.   meloxicam 15 MG tablet Commonly known  as: MOBIC TAKE 1 TABLET DAILY   metFORMIN 500 MG 24 hr tablet Commonly known as: GLUCOPHAGE-XR TAKE 2 TABLETS TWICE A DAY WITH A MEAL What changed: See the new instructions.   NYQUIL PO Take 2 capsules by mouth at bedtime.   simvastatin 40 MG tablet Commonly known as: ZOCOR Take 1 tablet (40 mg total) by mouth at bedtime.   Valium 10 MG tablet Generic drug: diazepam Take 10 mg by mouth at bedtime.       Follow-up Information    [facility]. Schedule an appointment as soon as possible for a visit in 2 week(s).   Contact information: [address] [place] [id number] [phone]              Consultations:  None   Procedures/Studies:  DG Chest 1 View  Result Date: 02/17/2020 CLINICAL DATA:  Increased congestion and shortness of breath since diagnosed with COVID-19 on 02/01/2020. EXAM: CHEST  1 VIEW COMPARISON:  01/25/2019 FINDINGS: Normal sized heart. Clear lungs with normal vascularity. Stable calcified granuloma at the right lung base. No acute bony abnormality. IMPRESSION: No acute abnormality. Electronically Signed   By: Beckie SaltsSteven  Reid M.D.   On: 02/17/2020 17:55   CT Angio Chest PE W and/or Wo Contrast  Result Date: 02/17/2020 CLINICAL DATA:  PE suspected.  History of COVID 3 weeks ago. EXAM: CT ANGIOGRAPHY CHEST WITH CONTRAST TECHNIQUE: Multidetector CT imaging of the chest was performed using the standard protocol during bolus administration of intravenous contrast. Multiplanar CT image reconstructions and MIPs were obtained to evaluate the vascular anatomy. CONTRAST:  100mL OMNIPAQUE IOHEXOL 350 MG/ML SOLN COMPARISON:  None. FINDINGS: Cardiovascular: Contrast injection is sufficient to demonstrate satisfactory opacification of the pulmonary arteries to the segmental level. There is no pulmonary embolus or evidence of right heart strain. The size of the main pulmonary artery is normal. Heart size is mildly enlarged. There is  a trace pericardial effusion. The course and caliber of the aorta are normal. There is no atherosclerotic calcification. Opacification decreased due to pulmonary arterial phase contrast bolus timing. Mediastinum/Nodes: -- No mediastinal lymphadenopathy. -- No hilar lymphadenopathy. -- No axillary lymphadenopathy. -- No supraclavicular lymphadenopathy. --there is an apparent 2 cm thyroid nodule involving the inferior right thyroid gland. -  Unremarkable esophagus. Lungs/Pleura: There are trace bilateral pleural effusions. No pneumothorax. No large focal infiltrate. The trachea is unremarkable. Upper Abdomen: Contrast bolus timing is not optimized for evaluation of the abdominal organs. There is probable hepatic steatosis. The patient is status post prior cholecystectomy. The liver surface appears nodular. The partially visualized spleen appears enlarged. Musculoskeletal: No chest wall abnormality. No bony spinal canal stenosis. Review of the MIP images confirms the above findings. IMPRESSION: 1. No evidence of pulmonary embolus. 2. Trace bilateral pleural effusions. 3. Mild cardiomegaly with a trace pericardial effusion. 4. A 2 cm thyroid nodule involving the inferior right thyroid gland. Follow-up with an outpatient thyroid ultrasound is.(Ref: J Am Coll Radiol. 2015 Feb;12(2): 143-50). 5. The partially visualized liver appears nodular suggestive of underlying cirrhosis. There is probable hepatic steatosis. The partially visualized spleen is enlarged suggestive of underlying portal hypertension. Electronically Signed   By: Beryle Quanthristopher  Green M.D.  On: 02/17/2020 23:20   ECHOCARDIOGRAM COMPLETE  Result Date: 02/18/2020    ECHOCARDIOGRAM REPORT   Patient Name:   AMBERLEA SPAGNUOLO Date of Exam: 02/18/2020 Medical Rec #:  [id number]                   Height:       66.0 in Accession #:    [id number]                  Weight:       185.0 lb Date of Birth:  12/30/1965                    BSA:          1.935 m  Patient Age:    54 years                    BP:           113/61 mmHg Patient Gender: F                           HR:           72 bpm. Exam Location:  Inpatient Procedure: 2D Echo, Color Doppler and Cardiac Doppler Indications:    Pericardial effusion 423.9 / I31.3  History:        Patient has no prior history of Echocardiogram examinations.                 Risk Factors:Diabetes.  Sonographer:    Eulah Pont RDCS Referring Phys: [id number] Cecille Po MELVIN IMPRESSIONS  1. Left ventricular ejection fraction, by estimation, is 60 to 65%. The left ventricle has normal function. The left ventricle has no regional wall motion abnormalities. There is mild concentric left ventricular hypertrophy. Left ventricular diastolic parameters are consistent with Grade I diastolic dysfunction (impaired relaxation).  2. Right ventricular systolic function is normal. The right ventricular size is normal.  3. The pericardial effusion is posterior to the left ventricle and anterior to the right ventricle.  4. The mitral valve is normal in structure. No evidence of mitral valve regurgitation. No evidence of mitral stenosis.  5. The aortic valve is normal in structure. Aortic valve regurgitation is not visualized. No aortic stenosis is present.  6. The inferior vena cava is normal in size with greater than 50% respiratory variability, suggesting right atrial pressure of 3 mmHg. FINDINGS  Left Ventricle: Left ventricular ejection fraction, by estimation, is 60 to 65%. The left ventricle has normal function. The left ventricle has no regional wall motion abnormalities. The left ventricular internal cavity size was normal in size. There is  mild concentric left ventricular hypertrophy. Left ventricular diastolic parameters are consistent with Grade I diastolic dysfunction (impaired relaxation). Normal left ventricular filling pressure. Right Ventricle: The right ventricular size is normal. No increase in right ventricular wall thickness.  Right ventricular systolic function is normal. Left Atrium: Left atrial size was normal in size. Right Atrium: Right atrial size was normal in size. Pericardium: Trivial pericardial effusion is present. The pericardial effusion is posterior to the left ventricle and anterior to the right ventricle. Mitral Valve: The mitral valve is normal in structure. No evidence of mitral valve regurgitation. No evidence of mitral valve stenosis. Tricuspid Valve: The tricuspid valve is normal in structure. Tricuspid valve regurgitation is trivial. No evidence of tricuspid stenosis. Aortic Valve: The aortic valve is normal in structure. Aortic valve  regurgitation is not visualized. No aortic stenosis is present. Pulmonic Valve: The pulmonic valve was normal in structure. Pulmonic valve regurgitation is not visualized. No evidence of pulmonic stenosis. Aorta: The aortic root is normal in size and structure. Venous: The inferior vena cava is normal in size with greater than 50% respiratory variability, suggesting right atrial pressure of 3 mmHg. IAS/Shunts: No atrial level shunt detected by color flow Doppler.  LEFT VENTRICLE PLAX 2D LVIDd:         5.20 cm  Diastology LVIDs:         3.30 cm  LV e' medial:    7.33 cm/s LV PW:         1.20 cm  LV E/e' medial:  6.5 LV IVS:        1.10 cm  LV e' lateral:   11.70 cm/s LVOT diam:     2.00 cm  LV E/e' lateral: 4.1 LV SV:         61 LV SV Index:   32 LVOT Area:     3.14 cm  RIGHT VENTRICLE RV S prime:     12.40 cm/s TAPSE (M-mode): 2.1 cm LEFT ATRIUM             Index       RIGHT ATRIUM           Index LA diam:        3.80 cm 1.96 cm/m  RA Area:     11.10 cm LA Vol (A2C):   30.3 ml 15.66 ml/m RA Volume:   24.60 ml  12.72 ml/m LA Vol (A4C):   37.1 ml 19.18 ml/m LA Biplane Vol: 33.8 ml 17.47 ml/m  AORTIC VALVE LVOT Vmax:   92.90 cm/s LVOT Vmean:  67.200 cm/s LVOT VTI:    0.195 m  AORTA Ao Root diam: 3.20 cm Ao Asc diam:  2.90 cm MITRAL VALVE MV Area (PHT): 1.42 cm    SHUNTS MV Decel  Time: 535 msec    Systemic VTI:  0.20 m MV E velocity: 48.00 cm/s  Systemic Diam: 2.00 cm MV A velocity: 60.70 cm/s MV E/A ratio:  0.79 Armanda Magic MD Electronically signed by Armanda Magic MD Signature Date/Time: 02/18/2020/10:41:26 AM    Final       Subjective: - no chest pain, shortness of breath, no abdominal pain, nausea or vomiting.   Discharge Exam: BP 102/70 (BP Location: Left Arm)   Pulse 64   Temp 97.6 F (36.4 C) (Oral)   Resp 16   Ht 5\' 6"  (1.676 m)   Wt 82.6 kg   SpO2 91%   BMI 29.41 kg/m   General: Pt is alert, awake, not in acute distress Cardiovascular: RRR, S1/S2 +, no rubs, no gallops Respiratory: CTA bilaterally, no wheezing, no rhonchi Abdominal: Soft, NT, ND, bowel sounds + Extremities: no edema, no cyanosis    The results of significant diagnostics from this hospitalization (including imaging, microbiology, ancillary and laboratory) are listed below for reference.     Microbiology: No results found for this or any previous visit (from the past 240 hour(s)).   Labs: Basic Metabolic Panel: Recent Labs  Lab 02/17/20 1715 02/18/20 0228 02/19/20 0238 02/20/20 0341  NA 138 137 135 135  K 3.7 3.3* 3.9 3.5  CL 107 106 100 99  CO2 22 21* 23 24  GLUCOSE 187* 181* 207* 202*  BUN 12 14 12 20   CREATININE 0.82 0.66 0.81 0.95  CALCIUM 9.4 8.9 9.4 9.4  MG  --   --  1.7  --    Liver Function Tests: Recent Labs  Lab 02/17/20 1715 02/18/20 0228 02/19/20 0238 02/20/20 0341  AST 53* 36 49* 38  ALT 81* 67* 80* 71*  ALKPHOS 114 96 96 102  BILITOT 1.0 1.1 1.1 1.2  PROT 7.0 6.6 6.8 6.7  ALBUMIN 4.0 3.5 3.9 3.8   CBC: Recent Labs  Lab 02/17/20 1715 02/18/20 0228 02/19/20 0238  WBC 12.8* 7.4 6.1  NEUTROABS 9.8*  --   --   HGB 13.5 12.1 13.3  HCT 41.3 36.0 41.1  MCV 86.9 86.5 88.6  PLT 254 203 207   CBG: Recent Labs  Lab 02/19/20 0730 02/19/20 1120 02/19/20 1608 02/19/20 2126 02/20/20 0611  GLUCAP 201* 220* 167* 169* 212*   Hgb A1c No  results for input(s): HGBA1C in the last 72 hours. Lipid Profile No results for input(s): CHOL, HDL, LDLCALC, TRIG, CHOLHDL, LDLDIRECT in the last 72 hours. Thyroid function studies No results for input(s): TSH, T4TOTAL, T3FREE, THYROIDAB in the last 72 hours.  Invalid input(s): FREET3 Urinalysis    Component Value Date/Time   COLORURINE YELLOW 07/13/2019 1520   APPEARANCEUR HAZY (A) 07/13/2019 1520   LABSPEC 1.030 07/13/2019 1520   PHURINE 5.0 07/13/2019 1520   GLUCOSEU >=500 (A) 07/13/2019 1520   HGBUR NEGATIVE 07/13/2019 1520   BILIRUBINUR negative 09/14/2019 0915   BILIRUBINUR NEgative 03/15/2019 1144   KETONESUR negative 09/14/2019 0915   KETONESUR NEGATIVE 07/13/2019 1520   PROTEINUR NEGATIVE 07/13/2019 1520   UROBILINOGEN 2.0 (A) 09/14/2019 0915   NITRITE Negative 09/14/2019 0915   NITRITE POSITIVE (A) 07/13/2019 1520   LEUKOCYTESUR Negative 09/14/2019 0915   LEUKOCYTESUR TRACE (A) 07/13/2019 1520    FURTHER DISCHARGE INSTRUCTIONS:   Get Medicines reviewed and adjusted: Please take all your medications with you for your next visit with your Primary MD   Laboratory/radiological data: Please request your Primary MD to go over all hospital tests and procedure/radiological results at the follow up, please ask your Primary MD to get all Hospital records sent to his/her office.   In some cases, they will be blood work, cultures and biopsy results pending at the time of your discharge. Please request that your primary care M.D. goes through all the records of your hospital data and follows up on these results.   Also Note the following: If you experience worsening of your admission symptoms, develop shortness of breath, life threatening emergency, suicidal or homicidal thoughts you must seek medical attention immediately by calling 911 or calling your MD immediately  if symptoms less severe.   You must read complete instructions/literature along with all the possible adverse  reactions/side effects for all the Medicines you take and that have been prescribed to you. Take any new Medicines after you have completely understood and accpet all the possible adverse reactions/side effects.    Do not drive when taking Pain medications or sleeping medications (Benzodaizepines)   Do not take more than prescribed Pain, Sleep and Anxiety Medications. It is not advisable to combine anxiety,sleep and pain medications without talking with your primary care practitioner   Special Instructions: If you have smoked or chewed Tobacco  in the last 2 yrs please stop smoking, stop any regular Alcohol  and or any Recreational drug use.   Wear Seat belts while driving.   Please note: You were cared for by a hospitalist during your hospital stay. Once you are discharged, your primary care physician will handle any further medical issues. Please note that NO  REFILLS for any discharge medications will be authorized once you are discharged, as it is imperative that you return to your primary care physician (or establish a relationship with a primary care physician if you do not have one) for your post hospital discharge needs so that they can reassess your need for medications and monitor your lab values.  Time coordinating discharge: 35 minutes  SIGNED:  Pamella Pert, MD, PhD 02/20/2020, 10:00 AM

## 2020-02-20 NOTE — Discharge Instructions (Signed)
Discharge instructions  DIET  Low sodium Heart Healthy Diet with fluid restriction 1500cc/day  ACTIVITY  Avoid strenuous activity  WEIGH DAILY AT SAME TIME . CALL YOUR DOCTOR IF WEIGHT INCREASES BY MORE THAN 3  POUNDS IN 2 DAYS  CALL YOUR DOCTOR OR COME TO EMERGENCY ROOM IF WORSENING SHORTNESS OF BREATH OR  CHEST PAIN OR SWELLING  F/U with PMD in 1 week to recheck labs and adjust fluid pills as needed  If you smoke cigarettes or use any tobacco products you are advised to stop. Please ask nurse for any written materials  Or additional information you want regarding smoking cessation  Follow with Grayce Sessions, NP in 5-7 days  Please get a complete blood count and chemistry panel checked by your Primary MD at your next visit, and again as instructed by your Primary MD. Please get your medications reviewed and adjusted by your Primary MD.  Please request your Primary MD to go over all Hospital Tests and Procedure/Radiological results at the follow up, please get all Hospital records sent to your Prim MD by signing hospital release before you go home.  In some cases, there will be blood work, cultures and biopsy results pending at the time of your discharge. Please request that your primary care M.D. goes through all the records of your hospital data and follows up on these results.  If you had Pneumonia of Lung problems at the Hospital: Please get a 2 view Chest X ray done in 6-8 weeks after hospital discharge or sooner if instructed by your Primary MD.  If you have Congestive Heart Failure: Please call your Cardiologist or Primary MD anytime you have any of the following symptoms:  1) 3 pound weight gain in 24 hours or 5 pounds in 1 week  2) shortness of breath, with or without a dry hacking cough  3) swelling in the hands, feet or stomach  4) if you have to sleep on extra pillows at night in order to breathe  Follow cardiac low salt diet and 1.5 lit/day fluid restriction.  If  you have diabetes Accuchecks 4 times/day, Once in AM empty stomach and then before each meal. Log in all results and show them to your primary doctor at your next visit. If any glucose reading is under 80 or above 300 call your primary MD immediately.  If you have Seizure/Convulsions/Epilepsy: Please do not drive, operate heavy machinery, participate in activities at heights or participate in high speed sports until you have seen by Primary MD or a Neurologist and advised to do so again. Per [facility] statutes, patients with seizures are not allowed to drive until they have been seizure-free for six months.  Use caution when using heavy equipment or power tools. Avoid working on ladders or at heights. Take showers instead of baths. Ensure the water temperature is not too high on the home water heater. Do not go swimming alone. Do not lock yourself in a room alone (i.e. bathroom). When caring for infants or small children, sit down when holding, feeding, or changing them to minimize risk of injury to the child in the event you have a seizure. Maintain good sleep hygiene. Avoid alcohol.   If you had Gastrointestinal Bleeding: Please ask your Primary MD to check a complete blood count within one week of discharge or at your next visit. Your endoscopic/colonoscopic biopsies that are pending at the time of discharge, will also need to followed by your Primary MD.  Get  Medicines reviewed and adjusted. Please take all your medications with you for your next visit with your Primary MD  Please request your Primary MD to go over all hospital tests and procedure/radiological results at the follow up, please ask your Primary MD to get all Hospital records sent to his/her office.  If you experience worsening of your admission symptoms, develop shortness of breath, life threatening emergency, suicidal or homicidal thoughts you must seek medical attention immediately by calling 911 or calling your MD  immediately  if symptoms less severe.  You must read complete instructions/literature along with all the possible adverse reactions/side effects for all the Medicines you take and that have been prescribed to you. Take any new Medicines after you have completely understood and accpet all the possible adverse reactions/side effects.   Do not drive or operate heavy machinery when taking Pain medications.   Do not take more than prescribed Pain, Sleep and Anxiety Medications  Special Instructions: If you have smoked or chewed Tobacco  in the last 2 yrs please stop smoking, stop any regular Alcohol  and or any Recreational drug use.  Wear Seat belts while driving.  Please note You were cared for by a hospitalist during your hospital stay. If you have any questions about your discharge medications or the care you received while you were in the hospital after you are discharged, you can call the unit and asked to speak with the hospitalist on call if the hospitalist that took care of you is not available. Once you are discharged, your primary care physician will handle any further medical issues. Please note that NO REFILLS for any discharge medications will be authorized once you are discharged, as it is imperative that you return to your primary care physician (or establish a relationship with a primary care physician if you do not have one) for your aftercare needs so that they can reassess your need for medications and monitor your lab values.  You can reach the hospitalist office at phone [phone] or fax [phone]   If you do not have a primary care physician, you can call [phone] for a physician referral.  Activity: As tolerated with Full fall precautions use walker/cane & assistance as needed    Diet: low sodium  Disposition Home

## 2020-02-21 ENCOUNTER — Telehealth: Payer: Self-pay

## 2020-02-21 NOTE — Telephone Encounter (Signed)
Transition Care Management Follow-up Telephone Call  Date of discharge and from where: 02/20/2020, [facility]   How have you been since you were released from the hospital? She said she is coughing more; but feeling a lot better.   Any questions or concerns? Yes   She needs a referral to cardiology  She is requesting a new glucometer.  She explained that she was on a long acting insulin, lantus, in the hospital that helped to keep her blood sugars controlled.  She is currently on basaglar which is long acting, but she would like to discuss insulin options with PCP.  Items Reviewed:  Did the pt receive and understand the discharge instructions provided? Yes   Medications obtained and verified? Yes  -she did not have any questions about the med regime. She has all medications and correctly stated how she is to take the furosemide and potassium.   Other? No   Any new allergies since your discharge? No   Dietary orders reviewed? Yes - she stated that she understands that she is to limit sodium intake. She was not aware of the 1500 ml/day fluid restriction that is noted on her AVS.   Do you have support at home? Yes  - her husband  Home Care and Equipment/Supplies: Were home health services ordered? no If so, what is the name of the agency? n/a  Has the agency set up a time to come to the patient's home? n/a Were any new equipment or medical supplies ordered?  No What is the name of the medical supply agency? n/a Were you able to get the supplies/equipment? n/a Do you have any questions related to the use of the equipment or supplies? No, n/a      Functional Questionnaire: (I = Independent and D = Dependent) ADLs:independent  Follow up appointments reviewed:   PCP Hospital f/u appt confirmed? Yes  Gwinda Passe, NP 03/03/2020. [facility]  Specialist Hospital f/u appt confirmed? No , none scheduled yet  Are transportation arrangements needed? No   If their condition  worsens, is the pt aware to call PCP or go to the Emergency Dept.?yes   Was the patient provided with contact information for the PCP's office or ED? She has the phone number for [facility]  Was to pt encouraged to call back with questions or concerns? Yes

## 2020-03-03 ENCOUNTER — Encounter (INDEPENDENT_AMBULATORY_CARE_PROVIDER_SITE_OTHER): Payer: Self-pay | Admitting: Primary Care

## 2020-03-03 ENCOUNTER — Telehealth (INDEPENDENT_AMBULATORY_CARE_PROVIDER_SITE_OTHER): Payer: 59 | Admitting: Primary Care

## 2020-03-03 DIAGNOSIS — E042 Nontoxic multinodular goiter: Secondary | ICD-10-CM

## 2020-03-03 DIAGNOSIS — K760 Fatty (change of) liver, not elsewhere classified: Secondary | ICD-10-CM

## 2020-03-03 NOTE — Progress Notes (Signed)
Referring-Michelle Edwards NP Reason for referral-Abnormal echo  HPI: 55 yo female for evaluation of abnormal echo at request of Gwinda Passe NP. CTA 1/22 showed no pulmonary embolus and trace pericardial effusion; thyroid nodule; possible cirrhosis, enlarged spleen suggesting portal hypertension. Echo 02/18/20 showed normal LV function and trivial pericardial effusion. Recently admitted with dyspnea and chest pain (diagnosed with covid early Jan). Was felt to have CHF and given lasix with improvement in symptoms.  Patient now takes Lasix as needed for weight gain of approximately 2 pounds.  She does have some dyspnea on exertion.  She denies pedal edema.  She has chest heaviness when she lies flat that improves with sitting up.  She also has dizziness that occurs both with standing and lying.  She has not had syncope.  Cardiology asked to evaluate.  Current Outpatient Medications  Medication Sig Dispense Refill  . aspirin EC 81 MG tablet Take 81 mg by mouth at bedtime.    . cyclobenzaprine (FLEXERIL) 10 MG tablet Take 1 tablet (10 mg total) by mouth 3 (three) times daily as needed for muscle spasms. 60 tablet 3  . diphenhydramine-acetaminophen (TYLENOL PM) 25-500 MG TABS tablet Take 1 tablet by mouth at bedtime as needed (sleep/pain).    Marland Kitchen ezetimibe (ZETIA) 10 MG tablet Take 1 tablet (10 mg total) by mouth daily. 90 tablet 1  . furosemide (LASIX) 40 MG tablet Take 1 tablet (40 mg total) by mouth daily as needed for fluid or edema. 30 tablet 1  . glipiZIDE (GLUCOTROL XL) 10 MG 24 hr tablet Take 1 tablet (10 mg total) by mouth 2 (two) times daily after a meal. 180 tablet 1  . Insulin Glargine (BASAGLAR KWIKPEN) 100 UNIT/ML Inject 0.15 mLs (15 Units total) into the skin at bedtime. (Patient taking differently: Inject 15 Units into the skin at bedtime as needed (high blood sugar).) 6 pen 1  . levothyroxine (SYNTHROID) 100 MCG tablet Take 1 tablet (100 mcg total) by mouth daily. 90 tablet 0  .  meloxicam (MOBIC) 15 MG tablet TAKE 1 TABLET DAILY 90 tablet 3  . metFORMIN (GLUCOPHAGE-XR) 500 MG 24 hr tablet TAKE 2 TABLETS TWICE A DAY WITH A MEAL (Patient taking differently: Take 1,000 mg by mouth in the morning and at bedtime.) 180 tablet 7  . potassium chloride SA (KLOR-CON) 20 MEQ tablet Take 1 tablet (20 mEq total) by mouth daily as needed (when you take Lasix). 30 tablet 1  . Pseudoeph-Doxylamine-DM-APAP (NYQUIL PO) Take 2 capsules by mouth at bedtime.    . simvastatin (ZOCOR) 40 MG tablet Take 1 tablet (40 mg total) by mouth at bedtime. 90 tablet 1  . VALIUM 10 MG tablet Take 10 mg by mouth at bedtime.     No current facility-administered medications for this visit.    Allergies  Allergen Reactions  . Celebrex [Celecoxib] Other (See Comments)    Patients reports weakness/ signs of stroke when she takes this.     Past Medical History:  Diagnosis Date  . Diabetes mellitus without complication (HCC)   . Hyperlipidemia   . Hypothyroid   . Nephritis     Past Surgical History:  Procedure Laterality Date  . ABDOMINAL HYSTERECTOMY    . CESAREAN SECTION    . CHOLECYSTECTOMY    . KNEE SURGERY Bilateral   . SINUS EXPLORATION     sinus surgery    Social History   Socioeconomic History  . Marital status: Married    Spouse name: Not on  file  . Number of children: 4  . Years of education: Not on file  . Highest education level: Not on file  Occupational History  . Not on file  Tobacco Use  . Smoking status: Never Smoker  . Smokeless tobacco: Never Used  Substance and Sexual Activity  . Alcohol use: Yes    Comment: Rare  . Drug use: Not Currently  . Sexual activity: Not on file  Other Topics Concern  . Not on file  Social History Narrative  . Not on file   Social Determinants of Health   Financial Resource Strain: Not on file  Food Insecurity: Not on file  Transportation Needs: Not on file  Physical Activity: Not on file  Stress: Not on file  Social  Connections: Not on file  Intimate Partner Violence: Not on file    Family History  Problem Relation Age of Onset  . Hypothyroidism Mother   . Diabetes Mother   . Heart disease Father   . Heart disease Brother     ROS: no fevers or chills, productive cough, hemoptysis, dysphasia, odynophagia, melena, hematochezia, dysuria, hematuria, rash, seizure activity, orthopnea, PND, pedal edema, claudication. Remaining systems are negative.  Physical Exam:   Blood pressure 104/66, pulse 74, height 5\' 6"  (1.676 m), weight 184 lb (83.5 kg).  General:  Well developed/well nourished in NAD Skin warm/dry Patient not depressed No peripheral clubbing Back-normal HEENT-normal/normal eyelids Neck supple/normal carotid upstroke bilaterally; no bruits; no JVD; no thyromegaly chest - CTA/ normal expansion CV - RRR/normal S1 and S2; no murmurs, rubs or gallops;  PMI nondisplaced Abdomen -NT/ND, no HSM, no mass, + bowel sounds, no bruit 2+ femoral pulses, no bruits Ext-no edema, chords, 2+ DP Neuro-grossly nonfocal  ECG -February 17, 2020-sinus tachycardia with nonspecific ST changes.  Personally reviewed  A/P  1 chronic diastolic CHF-patient is not volume overloaded on examination.  She will continue Lasix/potassium as needed.  Needs fluid restriction and low-sodium diet.  2 abnormal echo-trivial effusion noted on prior study; will not pursue additional testing.  3 hyperlipidemia-continue statin.  4 Thyroid nodule-have asked pt to fu with primary care.  5 Question cirrhosis-noted on recent abdominal CT-FU primary care.  February 19, 2020, MD

## 2020-03-03 NOTE — Progress Notes (Incomplete)
Virtual Visit via Telephone Note  I connected with Whitney Thornton on 03/03/20 at  2:30 PM EST by telephone and verified that I am speaking with the correct person using two identifiers.  Location: Patient: *** Provider: ***   I discussed the limitations, risks, security and privacy concerns of performing an evaluation and management service by telephone and the availability of in person appointments. I also discussed with the patient that there may be a patient responsible charge related to this service. The patient expressed understanding and agreed to proceed.   History of Present Illness: .   Observations/Objective:   Assessment and Plan:   Follow Up Instructions:    I discussed the assessment and treatment plan with the patient. The patient was provided an opportunity to ask questions and all were answered. The patient agreed with the plan and demonstrated an understanding of the instructions.   The patient was advised to call back or seek an in-person evaluation if the symptoms worsen or if the condition fails to improve as anticipated.  I provided *** minutes of non-face-to-face time during this encounter.   Grayce Sessions, NP

## 2020-03-04 ENCOUNTER — Other Ambulatory Visit: Payer: Self-pay

## 2020-03-04 ENCOUNTER — Ambulatory Visit (INDEPENDENT_AMBULATORY_CARE_PROVIDER_SITE_OTHER): Payer: Self-pay

## 2020-03-04 ENCOUNTER — Ambulatory Visit (INDEPENDENT_AMBULATORY_CARE_PROVIDER_SITE_OTHER): Payer: 59 | Admitting: Cardiology

## 2020-03-04 ENCOUNTER — Encounter: Payer: Self-pay | Admitting: Cardiology

## 2020-03-04 VITALS — BP 104/66 | HR 74 | Ht 66.0 in | Wt 184.0 lb

## 2020-03-04 DIAGNOSIS — I313 Pericardial effusion (noninflammatory): Secondary | ICD-10-CM | POA: Diagnosis not present

## 2020-03-04 DIAGNOSIS — I5032 Chronic diastolic (congestive) heart failure: Secondary | ICD-10-CM

## 2020-03-04 DIAGNOSIS — R931 Abnormal findings on diagnostic imaging of heart and coronary circulation: Secondary | ICD-10-CM | POA: Diagnosis not present

## 2020-03-04 DIAGNOSIS — E7800 Pure hypercholesterolemia, unspecified: Secondary | ICD-10-CM

## 2020-03-04 DIAGNOSIS — I3139 Other pericardial effusion (noninflammatory): Secondary | ICD-10-CM

## 2020-03-04 NOTE — Telephone Encounter (Signed)
Patient called and says she was just seen by the cardiologist today and was told to follow up with her PCP about her post covid SOB, spleen issues, nodule on thyroid and other things. I called the office and spoke to Addis, [facility] who asks to speak to the patient to talk about the virtual appointment the patient refuses. The call was connected successfully. No triage due to no acute symptoms at this time after speaking to the patient.

## 2020-03-04 NOTE — Patient Instructions (Signed)

## 2020-03-07 ENCOUNTER — Ambulatory Visit: Payer: 59 | Admitting: Cardiology

## 2020-03-28 ENCOUNTER — Ambulatory Visit: Payer: Self-pay

## 2020-03-28 ENCOUNTER — Ambulatory Visit (INDEPENDENT_AMBULATORY_CARE_PROVIDER_SITE_OTHER): Payer: 59 | Admitting: Surgical

## 2020-03-28 ENCOUNTER — Encounter: Payer: Self-pay | Admitting: Surgical

## 2020-03-28 VITALS — Ht 66.0 in | Wt 183.8 lb

## 2020-03-28 DIAGNOSIS — M1712 Unilateral primary osteoarthritis, left knee: Secondary | ICD-10-CM

## 2020-03-28 DIAGNOSIS — M1711 Unilateral primary osteoarthritis, right knee: Secondary | ICD-10-CM | POA: Diagnosis not present

## 2020-03-28 DIAGNOSIS — Z7689 Persons encountering health services in other specified circumstances: Secondary | ICD-10-CM

## 2020-03-28 DIAGNOSIS — S52124A Nondisplaced fracture of head of right radius, initial encounter for closed fracture: Secondary | ICD-10-CM

## 2020-03-29 ENCOUNTER — Encounter: Payer: Self-pay | Admitting: Surgical

## 2020-03-29 NOTE — Progress Notes (Signed)
Office Visit Note   Patient: Whitney Thornton           Date of Birth: [birth year]           MRN: [id number] Visit Date: 03/28/2020 Requested by: Grayce Sessions, NP [address] [place],  [place] [address] PCP: Grayce Sessions, NP  Subjective: Chief Complaint  Patient presents with  . Left Knee - Pain  . Right Knee - Pain    HPI: Whitney Thornton is a 55 y.o. female who presents to the office complaining of bilateral knee pain and right elbow pain.  Patient returns for evaluation of bilateral knee pain.  She had contracted Covid shortly after her last office visit and has now had persistent pericardial effusion for which she has to take Lasix due to chest discomfort.  Planned on discussing surgery at this time of the last visit but she would like to hold off on that for now given the aftereffects of Covid that she is dealing with.  She complains of worsening bilateral knee pain, mostly localized to the medial aspect of the knee.  She denies any new injury, groin pain, low back pain, radicular pain, numbness/tingling.  She has failed cortisone injections and gel injections in the past but she did have some fairly lasting relief with dextrose injections.  Additionally, she reports continued right elbow pain.  She had sustained radial head fracture that was evaluated at last clinic visit.  She reports pain has significantly improved but she does have some persistent pain mostly bothers her when she tries to lay on that arm at night.  She reports her range of motion has returned to normal and she is able to functionally move the elbow without difficulty.              ROS: All systems reviewed are negative as they relate to the chief complaint within the history of present illness.  Patient denies fevers or chills.  Assessment & Plan: Visit Diagnoses:  1. Encounter to establish care   2. Unilateral primary osteoarthritis, left knee   3. Unilateral primary  osteoarthritis, right knee   4. Closed nondisplaced fracture of head of right radius, initial encounter     Plan: Is a 55 year old female who presents for reevaluation of bilateral knee pain and right elbow pain.  Her range of motion of the right elbow has recovered following her right radial head fracture.  Radiographs show healed radial head fracture with near-anatomic alignment.  She does have some minimal displacement of the fracture fragment.  Assured her that it is normal to have some persistent pain even several months out from her initial injury.  Regarding her bilateral knee pain, she wants to postpone any surgical intervention due to her persistent post-Covid symptoms.  She has had good relief with dextrose injections in the past and would like to try these today.  Bilateral dextrose injections were administered and patient tolerated the procedure well.  She will follow up in approximately 1 week for second in a series of 3 injections.  Radiographs were taken of the knees today and are negative for any significant progression since her last radiographs.  She has minimal degenerative changes radiographically.  Follow-Up Instructions: No follow-ups on file.   Orders:  Orders Placed This Encounter  Procedures  . XR KNEE 3 VIEW RIGHT  . XR Knee 1-2 Views Left  . XR Elbow Complete Right (3+View)  . Ambulatory referral to [facility]   No orders  of the defined types were placed in this encounter.     Procedures: Large Joint Inj: bilateral knee on 03/28/2020 7:41 AM Indications: diagnostic evaluation, joint swelling and pain Details: 18 G 1.5 in needle, superolateral approach  Arthrogram: No  Medications (Right): 5 mL lidocaine 1 % (Second injection of 6 cc bupivacaine and 4 cc dextrose) Medications (Left): 5 mL lidocaine 1 % (Second injection of 6 cc bupivacaine and 4 cc dextrose) Outcome: tolerated well, no immediate complications Procedure, treatment alternatives, risks and  benefits explained, specific risks discussed. Consent was given by the patient. Immediately prior to procedure a time out was called to verify the correct patient, procedure, equipment, support staff and site/side marked as required. Patient was prepped and draped in the usual sterile fashion.       Clinical Data: No additional findings.  Objective: Vital Signs: Ht 5\' 6"  (1.676 m)   Wt 183 lb 12.8 oz (83.4 kg)   BMI 29.67 kg/m   Physical Exam:  Constitutional: Patient appears well-developed HEENT:  Head: Normocephalic Eyes:EOM are normal Neck: Normal range of motion Cardiovascular: Normal rate Pulmonary/chest: Effort normal Neurologic: Patient is alert Skin: Skin is warm Psychiatric: Patient has normal mood and affect  Ortho Exam: Ortho exam demonstrates right elbow with 0 degrees extension, slight flexion to the point her hand can touch her shoulder.  Mild tenderness over the radial head.  No pain with passive supination, pronation, flexion, extension.  No swelling noted.  Bilateral knees with no effusion.  Tenderness to tenderness over the medial and lateral joint lines, primarily over the medial joint line.  No calf tenderness.  Negative Homans' sign.  No pain with hip range of motion.  Able to perform straight leg raise without difficulty.  Specialty Comments:  No specialty comments available.  Imaging: No results found.   PMFS History: Patient Active Problem List   Diagnosis Date Noted  . Pericardial effusion 02/18/2020  . Personal history of COVID-19 02/18/2020  . Transaminitis 02/18/2020  . Thyroid nodule 02/18/2020  . SOB (shortness of breath) 02/18/2020  . Hypothyroidism 02/18/2020   Past Medical History:  Diagnosis Date  . Diabetes mellitus without complication (HCC)   . Hyperlipidemia   . Hypothyroid   . Nephritis     Family History  Problem Relation Age of Onset  . Hypothyroidism Mother   . Diabetes Mother   . Heart disease Father   . Heart  disease Brother     Past Surgical History:  Procedure Laterality Date  . ABDOMINAL HYSTERECTOMY    . CESAREAN SECTION    . CHOLECYSTECTOMY    . KNEE SURGERY Bilateral   . SINUS EXPLORATION     sinus surgery   Social History   Occupational History  . Not on file  Tobacco Use  . Smoking status: Never Smoker  . Smokeless tobacco: Never Used  Substance and Sexual Activity  . Alcohol use: Yes    Comment: Rare  . Drug use: Not Currently  . Sexual activity: Not on file

## 2020-04-01 ENCOUNTER — Encounter (INDEPENDENT_AMBULATORY_CARE_PROVIDER_SITE_OTHER): Payer: Self-pay | Admitting: Primary Care

## 2020-04-01 DIAGNOSIS — R161 Splenomegaly, not elsewhere classified: Secondary | ICD-10-CM

## 2020-04-03 ENCOUNTER — Encounter: Payer: Self-pay | Admitting: Orthopedic Surgery

## 2020-04-03 ENCOUNTER — Ambulatory Visit (INDEPENDENT_AMBULATORY_CARE_PROVIDER_SITE_OTHER): Payer: 59 | Admitting: Orthopedic Surgery

## 2020-04-03 DIAGNOSIS — M1712 Unilateral primary osteoarthritis, left knee: Secondary | ICD-10-CM

## 2020-04-03 DIAGNOSIS — M1711 Unilateral primary osteoarthritis, right knee: Secondary | ICD-10-CM

## 2020-04-03 MED ORDER — LIDOCAINE HCL 1 % IJ SOLN
5.0000 mL | INTRAMUSCULAR | Status: AC | PRN
  Administered 2020-04-03: 5 mL

## 2020-04-03 NOTE — Progress Notes (Signed)
   Procedure Note  Patient: Whitney Thornton             Date of Birth: Nov 07, 1965           MRN: [id number]             Visit Date: 04/03/2020  Procedures: Visit Diagnoses:  1. Unilateral primary osteoarthritis, left knee   2. Unilateral primary osteoarthritis, right knee     Large Joint Inj: bilateral knee on 04/03/2020 8:54 AM Indications: diagnostic evaluation, joint swelling and pain Details: 18 G 1.5 in needle, superolateral approach  Arthrogram: No  Medications (Right): 5 mL lidocaine 1 % Medications (Left): 5 mL lidocaine 1 % Outcome: tolerated well, no immediate complications Procedure, treatment alternatives, risks and benefits explained, specific risks discussed. Consent was given by the patient. Immediately prior to procedure a time out was called to verify the correct patient, procedure, equipment, support staff and site/side marked as required. Patient was prepped and draped in the usual sterile fashion.    Bilateral dextrose injections performed today 6 cc lidocaine 4 cc dextrose

## 2020-04-04 ENCOUNTER — Ambulatory Visit: Payer: 59 | Admitting: Surgical

## 2020-04-04 ENCOUNTER — Other Ambulatory Visit (INDEPENDENT_AMBULATORY_CARE_PROVIDER_SITE_OTHER): Payer: Self-pay | Admitting: Primary Care

## 2020-04-04 DIAGNOSIS — E039 Hypothyroidism, unspecified: Secondary | ICD-10-CM

## 2020-04-04 DIAGNOSIS — E119 Type 2 diabetes mellitus without complications: Secondary | ICD-10-CM

## 2020-04-04 MED ORDER — METFORMIN HCL ER 500 MG PO TB24: ORAL_TABLET | ORAL | 0 refills | Status: DC

## 2020-04-04 MED ORDER — SIMVASTATIN 40 MG PO TABS: 40.0000 mg | ORAL_TABLET | Freq: Every day | ORAL | 0 refills | Status: DC

## 2020-04-04 MED ORDER — GLIPIZIDE ER 10 MG PO TB24: 10.0000 mg | ORAL_TABLET | Freq: Two times a day (BID) | ORAL | 0 refills | Status: DC

## 2020-04-04 MED ORDER — LEVOTHYROXINE SODIUM 100 MCG PO TABS: 100.0000 ug | ORAL_TABLET | Freq: Every day | ORAL | 0 refills | Status: DC

## 2020-04-04 MED ORDER — MELOXICAM 15 MG PO TABS: 15.0000 mg | ORAL_TABLET | Freq: Every day | ORAL | 0 refills | Status: DC

## 2020-04-04 MED ORDER — EZETIMIBE 10 MG PO TABS: 10.0000 mg | ORAL_TABLET | Freq: Every day | ORAL | 0 refills | Status: DC

## 2020-04-04 NOTE — Telephone Encounter (Signed)
Medication Refill - Medication: Metformin, Glipizide, Synthroid, Meloxicam, Ezetimibe, Simvastatin  Has the patient contacted their pharmacy? Yes.  Pt states that she will be out before the end of the weekend. Pt is changing pharmacies and sent a mychart message on 04/01/20. Please advise.  (Agent: If no, request that the patient contact the pharmacy for the refill.) (Agent: If yes, when and what did the pharmacy advise?)  Preferred Pharmacy (with phone number or street name):  [id number] - [place] ([place]), [place] - [address]  [address] [place] ([place] [address]  Phone: [phone] Fax: [phone]  Hours: Not open 24 hours     Agent: Please be advised that RX refills may take up to 3 business days. We ask that you follow-up with your pharmacy.

## 2020-04-06 MED ORDER — LIDOCAINE HCL 1 % IJ SOLN
5.0000 mL | INTRAMUSCULAR | Status: AC | PRN
  Administered 2020-03-28: 5 mL

## 2020-04-09 ENCOUNTER — Ambulatory Visit: Payer: 59 | Admitting: Orthopedic Surgery

## 2020-04-18 ENCOUNTER — Ambulatory Visit: Payer: 59 | Admitting: Surgical

## 2020-04-29 ENCOUNTER — Ambulatory Visit: Payer: 59 | Admitting: Endocrinology

## 2020-05-31 ENCOUNTER — Emergency Department
Admission: EM | Admit: 2020-05-31 | Discharge: 2020-05-31 | Disposition: A | Discharge status: Home / Self Care | Payer: 59 | Attending: Emergency Medicine | Admitting: Emergency Medicine

## 2020-05-31 ENCOUNTER — Other Ambulatory Visit: Payer: Self-pay

## 2020-05-31 ENCOUNTER — Encounter: Payer: Self-pay | Admitting: Intensive Care

## 2020-05-31 ENCOUNTER — Emergency Department: Payer: 59

## 2020-05-31 DIAGNOSIS — E039 Hypothyroidism, unspecified: Secondary | ICD-10-CM | POA: Diagnosis not present

## 2020-05-31 DIAGNOSIS — Z79899 Other long term (current) drug therapy: Secondary | ICD-10-CM | POA: Insufficient documentation

## 2020-05-31 DIAGNOSIS — E1165 Type 2 diabetes mellitus with hyperglycemia: Secondary | ICD-10-CM | POA: Insufficient documentation

## 2020-05-31 DIAGNOSIS — R0602 Shortness of breath: Secondary | ICD-10-CM

## 2020-05-31 DIAGNOSIS — I509 Heart failure, unspecified: Secondary | ICD-10-CM | POA: Insufficient documentation

## 2020-05-31 DIAGNOSIS — Z794 Long term (current) use of insulin: Secondary | ICD-10-CM | POA: Diagnosis not present

## 2020-05-31 DIAGNOSIS — Z8616 Personal history of COVID-19: Secondary | ICD-10-CM | POA: Insufficient documentation

## 2020-05-31 DIAGNOSIS — N3000 Acute cystitis without hematuria: Secondary | ICD-10-CM | POA: Diagnosis not present

## 2020-05-31 DIAGNOSIS — Z7982 Long term (current) use of aspirin: Secondary | ICD-10-CM | POA: Insufficient documentation

## 2020-05-31 DIAGNOSIS — R739 Hyperglycemia, unspecified: Secondary | ICD-10-CM

## 2020-05-31 LAB — URINALYSIS, COMPLETE (UACMP) WITH MICROSCOPIC
Bilirubin Urine: NEGATIVE
Glucose, UA: >=500 mg/dL — AB
Hgb urine dipstick: NEGATIVE
Ketones, ur: NEGATIVE mg/dL
Nitrite: NEGATIVE
Protein, ur: 30 mg/dL — AB
Specific Gravity, Urine: 1.029 (ref 1.005–1.030)
WBC, UA: >50 WBC/hpf — ABNORMAL HIGH (ref 0–5)
pH: 5 (ref 5.0–8.0)

## 2020-05-31 LAB — CBC WITH DIFFERENTIAL/PLATELET
Abs Immature Granulocytes: 0.06 10*3/uL (ref 0.00–0.07)
Basophils Absolute: 0.1 10*3/uL (ref 0.0–0.1)
Basophils Relative: 1 %
Eosinophils Absolute: 0.4 10*3/uL (ref 0.0–0.5)
Eosinophils Relative: 4 %
HCT: 44.5 % (ref 36.0–46.0)
Hemoglobin: 15.3 g/dL — ABNORMAL HIGH (ref 12.0–15.0)
Immature Granulocytes: 1 %
Lymphocytes Relative: 16 %
Lymphs Abs: 1.5 10*3/uL (ref 0.7–4.0)
MCH: 29.1 pg (ref 26.0–34.0)
MCHC: 34.4 g/dL (ref 30.0–36.0)
MCV: 84.8 fL (ref 80.0–100.0)
Monocytes Absolute: 0.8 10*3/uL (ref 0.1–1.0)
Monocytes Relative: 9 %
Neutro Abs: 6.7 10*3/uL (ref 1.7–7.7)
Neutrophils Relative %: 69 %
Platelets: 219 10*3/uL (ref 150–400)
RBC: 5.25 MIL/uL — ABNORMAL HIGH (ref 3.87–5.11)
RDW: 13.4 % (ref 11.5–15.5)
WBC: 9.5 10*3/uL (ref 4.0–10.5)
nRBC: 0 % (ref 0.0–0.2)

## 2020-05-31 LAB — BRAIN NATRIURETIC PEPTIDE: B Natriuretic Peptide: 9.8 pg/mL (ref 0.0–100.0)

## 2020-05-31 LAB — COMPREHENSIVE METABOLIC PANEL
ALT: 87 U/L — ABNORMAL HIGH (ref 0–44)
AST: 41 U/L (ref 15–41)
Albumin: 4.2 g/dL (ref 3.5–5.0)
Alkaline Phosphatase: 113 U/L (ref 38–126)
Anion gap: 9 (ref 5–15)
BUN: 11 mg/dL (ref 6–20)
CO2: 23 mmol/L (ref 22–32)
Calcium: 8.8 mg/dL — ABNORMAL LOW (ref 8.9–10.3)
Chloride: 100 mmol/L (ref 98–111)
Creatinine, Ser: 0.8 mg/dL (ref 0.44–1.00)
GFR, Estimated: >60 mL/min (ref >60)
Glucose, Bld: 409 mg/dL — ABNORMAL HIGH (ref 70–99)
Potassium: 4.4 mmol/L (ref 3.5–5.1)
Sodium: 132 mmol/L — ABNORMAL LOW (ref 135–145)
Total Bilirubin: 0.9 mg/dL (ref 0.3–1.2)
Total Protein: 6.9 g/dL (ref 6.5–8.1)

## 2020-05-31 LAB — CBG MONITORING, ED: Glucose-Capillary: 279 mg/dL — ABNORMAL HIGH (ref 70–99)

## 2020-05-31 LAB — TROPONIN I (HIGH SENSITIVITY)
Troponin I (High Sensitivity): <2 ng/L (ref <18)
Troponin I (High Sensitivity): 4 ng/L (ref <18)

## 2020-05-31 LAB — D-DIMER, QUANTITATIVE: D-Dimer, Quant: <0.27 ug/mL-FEU (ref 0.00–0.50)

## 2020-05-31 LAB — LIPASE, BLOOD: Lipase: 31 U/L (ref 11–51)

## 2020-05-31 MED ORDER — KETOROLAC TROMETHAMINE 10 MG PO TABS: 10.0000 mg | ORAL_TABLET | Freq: Four times a day (QID) | ORAL | 0 refills | Status: AC | PRN

## 2020-05-31 MED ORDER — INSULIN ASPART 100 UNIT/ML IJ SOLN
10.0000 [IU] | Freq: Once | INTRAMUSCULAR | Status: AC
  Administered 2020-05-31: 10 [IU] via INTRAVENOUS
  Filled 2020-05-31: qty 1

## 2020-05-31 MED ORDER — CEPHALEXIN 500 MG PO CAPS: 500.0000 mg | ORAL_CAPSULE | Freq: Two times a day (BID) | ORAL | 0 refills | Status: AC

## 2020-05-31 MED ORDER — KETOROLAC TROMETHAMINE 30 MG/ML IJ SOLN
15.0000 mg | Freq: Once | INTRAMUSCULAR | Status: AC
  Administered 2020-05-31: 15 mg via INTRAVENOUS
  Filled 2020-05-31: qty 1

## 2020-05-31 MED ORDER — SODIUM CHLORIDE 0.9 % IV SOLN
1.0000 g | Freq: Once | INTRAVENOUS | Status: AC
  Administered 2020-05-31: 1 g via INTRAVENOUS
  Filled 2020-05-31: qty 10

## 2020-05-31 MED ORDER — SODIUM CHLORIDE 0.9 % IV BOLUS
1000.0000 mL | Freq: Once | INTRAVENOUS | Status: AC
  Administered 2020-05-31: 1000 mL via INTRAVENOUS

## 2020-05-31 NOTE — ED Notes (Signed)
Lab called d/t delay for add on labs

## 2020-05-31 NOTE — ED Provider Notes (Signed)
[facility] Emergency Department Provider Note  ____________________________________________   Event Date/Time   First MD Initiated Contact with Patient 05/31/20 1250     (approximate)  I have reviewed the triage vital signs and the nursing notes.   HISTORY  Chief Complaint Shortness of Breath and Urinary Frequency   HPI Whitney Thornton is a 55 y.o. female with a history of diabetes, pericardial effusion, COVID-19, transaminitis, hypothyroidism, congestive heart failure presents to the emergency department for treatment and evaluation of shortness of breath and dysuria.  Patient reports having shortness of breath since having COVID in January.  She states that over the past 3 days this has become worse.  She states that she has pain in her chest when she takes deep breaths.  She reports a long history of bladder infections for which she has been placed on preventative antibiotics, but reports having frequent "breakthroughs."  She states the dysuria has worsened today and her urine is cloudy.  Other than her daily medications, no other alleviating measures have been attempted prior to arrival.  She denies fever but does endorse frequent nausea without vomiting or diarrhea.  She denies constipation.  She also states that she has not had any follow-up lab work or in person hospital follow-up since she was discharged in January..   Past Medical History:  Diagnosis Date  . Diabetes mellitus without complication (HCC)   . Hyperlipidemia   . Hypothyroid   . Nephritis     Patient Active Problem List   Diagnosis Date Noted  . Pericardial effusion 02/18/2020  . Personal history of COVID-19 02/18/2020  . Transaminitis 02/18/2020  . Thyroid nodule 02/18/2020  . SOB (shortness of breath) 02/18/2020  . Hypothyroidism 02/18/2020    Past Surgical History:  Procedure Laterality Date  . ABDOMINAL HYSTERECTOMY    . CESAREAN SECTION    . CHOLECYSTECTOMY     . KNEE SURGERY Bilateral   . SINUS EXPLORATION     sinus surgery    Prior to Admission medications   Medication Sig Start Date End Date Taking? Authorizing Provider  cephALEXin (KEFLEX) 500 MG capsule Take 1 capsule (500 mg total) by mouth 2 (two) times daily for 7 days. 05/31/20 06/07/20 Yes Estella Malatesta B, FNP  ketorolac (TORADOL) 10 MG tablet Take 1 tablet (10 mg total) by mouth every 6 (six) hours as needed. 05/31/20  Yes Stanlee Roehrig B, FNP  aspirin EC 81 MG tablet Take 81 mg by mouth at bedtime.    [provider]  cyclobenzaprine (FLEXERIL) 10 MG tablet Take 1 tablet (10 mg total) by mouth 3 (three) times daily as needed for muscle spasms. 02/01/20   Magnant, Charles L, PA-C  diphenhydramine-acetaminophen (TYLENOL PM) 25-500 MG TABS tablet Take 1 tablet by mouth at bedtime as needed (sleep/pain).    [provider]  ezetimibe (ZETIA) 10 MG tablet Take 1 tablet (10 mg total) by mouth daily. 04/04/20   Grayce Sessions, NP  furosemide (LASIX) 40 MG tablet Take 1 tablet (40 mg total) by mouth daily as needed for fluid or edema. 02/20/20 02/19/21  Leatha Gilding, MD  glipiZIDE (GLUCOTROL XL) 10 MG 24 hr tablet Take 1 tablet (10 mg total) by mouth 2 (two) times daily after a meal. 04/04/20   Grayce Sessions, NP  Insulin Glargine (BASAGLAR KWIKPEN) 100 UNIT/ML Inject 0.15 mLs (15 Units total) into the skin at bedtime. Patient taking differently: Inject 15 Units into the skin at bedtime as needed (  high blood sugar). 07/04/19   Grayce SessionsEdwards, Michelle P, NP  levothyroxine (SYNTHROID) 100 MCG tablet Take 1 tablet (100 mcg total) by mouth daily. 04/04/20   Grayce SessionsEdwards, Michelle P, NP  meloxicam (MOBIC) 15 MG tablet Take 1 tablet (15 mg total) by mouth daily. 04/04/20   Grayce SessionsEdwards, Michelle P, NP  metFORMIN (GLUCOPHAGE-XR) 500 MG 24 hr tablet TAKE 2 TABLETS TWICE A DAY WITH A MEAL 04/04/20   Grayce SessionsEdwards, Michelle P, NP  potassium chloride SA (KLOR-CON) 20 MEQ tablet Take 1 tablet (20 mEq total) by  mouth daily as needed (when you take Lasix). 02/20/20   Leatha GildingGherghe, Costin M, MD  Pseudoeph-Doxylamine-DM-APAP (NYQUIL PO) Take 2 capsules by mouth at bedtime.    [provider]  simvastatin (ZOCOR) 40 MG tablet Take 1 tablet (40 mg total) by mouth at bedtime. 04/04/20   Grayce SessionsEdwards, Michelle P, NP  VALIUM 10 MG tablet Take 10 mg by mouth at bedtime. 02/11/20   [provider]  albuterol (VENTOLIN HFA) 108 (90 Base) MCG/ACT inhaler Inhale 1 puff into the lungs every 6 (six) hours as needed for wheezing or shortness of breath. 02/10/20 02/14/20  Grayce SessionsEdwards, Michelle P, NP    Allergies Celebrex [celecoxib]  Family History  Problem Relation Age of Onset  . Hypothyroidism Mother   . Diabetes Mother   . Heart disease Father   . Heart disease Brother     Social History Social History   Tobacco Use  . Smoking status: Never Smoker  . Smokeless tobacco: Never Used  Substance Use Topics  . Alcohol use: Yes    Comment: Rare  . Drug use: Not Currently    Review of Systems  Constitutional: No fever/chills. Eyes: No visual changes. ENT: No sore throat. Cardiovascular: Positive for chest pain.  Positive for pleuritic pain.  Negative for palpitations.  Positive for leg pain. Respiratory: Positive for shortness of breath. Gastrointestinal: Negative for abdominal pain. Positive for nausea, no vomiting.  No diarrhea.  No constipation. Genitourinary: Negative for dysuria. Musculoskeletal: Negative for back pain.  Skin: Negative for rash, lesion, wound. Neurological: Positive for headaches, negative for focal weakness or numbness. ____________________________________________   PHYSICAL EXAM:  VITAL SIGNS: ED Triage Vitals [05/31/20 1233]  Enc Vitals Group     BP (!) 106/52     Pulse Rate (!) 102     Resp 20     Temp 98.6 F (37 C)     Temp Source Oral     SpO2 97 %     Weight 185 lb (83.9 kg)     Height 5\' 6"  (1.676 m)     Head Circumference      Peak Flow      Pain Score 7      Pain Loc      Pain Edu?      Excl. in GC?     Constitutional: Alert and oriented. Overall well appearing and in no acute distress. Normal mental status. Eyes: Conjunctivae are normal. PERRL. Head: Atraumatic. Nose: No congestion/rhinnorhea. Mouth/Throat: Mucous membranes are moist.  Oropharynx non-erythematous. Tongue normal in size and color. Neck: No stridor. No carotid bruit appreciated on exam. Hematological/Lymphatic/Immunilogical: No cervical lymphadenopathy. Cardiovascular: Normal rate, regular rhythm. Grossly normal heart sounds.  Good peripheral circulation. Respiratory: Normal respiratory effort.  No retractions. Lungs CTAB. Gastrointestinal: Soft and nontender. No distention. No abdominal bruits. No CVA tenderness. Genitourinary: Exam deferred. Musculoskeletal: No lower extremity tenderness. No edema of extremities. Neurologic:  Normal speech and language. No gross focal neurologic  deficits are appreciated. Skin:  Skin is warm, dry and intact. No rash noted. Psychiatric: Mood and affect are normal. Speech and behavior are normal.  ____________________________________________   LABS (all labs ordered are listed, but only abnormal results are displayed)  Labs Reviewed  CBC WITH DIFFERENTIAL/PLATELET - Abnormal; Notable for the following components:      Result Value   RBC 5.25 (*)    Hemoglobin 15.3 (*)    All other components within normal limits  COMPREHENSIVE METABOLIC PANEL - Abnormal; Notable for the following components:   Sodium 132 (*)    Glucose, Bld 409 (*)    Calcium 8.8 (*)    ALT 87 (*)    All other components within normal limits  URINALYSIS, COMPLETE (UACMP) WITH MICROSCOPIC - Abnormal; Notable for the following components:   Color, Urine YELLOW (*)    APPearance CLOUDY (*)    Glucose, UA >=500 (*)    Protein, ur 30 (*)    Leukocytes,Ua MODERATE (*)    WBC, UA >50 (*)    Bacteria, UA MANY (*)    All other components within normal limits  CBG  MONITORING, ED - Abnormal; Notable for the following components:   Glucose-Capillary 279 (*)    All other components within normal limits  URINE CULTURE  LIPASE, BLOOD  BRAIN NATRIURETIC PEPTIDE  D-DIMER, QUANTITATIVE  TROPONIN I (HIGH SENSITIVITY)  TROPONIN I (HIGH SENSITIVITY)   ____________________________________________  EKG  ED ECG REPORT I, Teagyn Fishel, FNP-BC personally viewed and interpreted this ECG.   Date: 05/31/2020  EKG Time: 1229  Rate: 101  Rhythm: sinus tachycardia  Axis: normal  Intervals:none  ST&T Change: no ST elevation  ____________________________________________  RADIOLOGY  ED MD interpretation:  Chest x-ray without evidence of acute concerns.  I, Kem Boroughs, personally viewed and evaluated these images (plain radiographs) as part of my medical decision making, as well as reviewing the written report by the radiologist.  Official radiology report(s): DG Chest 2 View  Result Date: 05/31/2020 CLINICAL DATA:  Patient c/o sob, urinary symptoms (cloudy urine, burning during urination), and blurry vision in the past week. Reports she has been intermittently sob since getting diagnosed with covid January 11,2022, history of diabetes, EXAM: CHEST - 2 VIEW COMPARISON:  02/17/2020 FINDINGS: Stable linear scarring or subsegmental atelectasis in the right middle lobe and lingula. Lungs are otherwise clear. Heart size and mediastinal contours are within normal limits. No effusion.  No pneumothorax. Visualized bones unremarkable.  Cholecystectomy clips. IMPRESSION: No acute cardiopulmonary disease. Electronically Signed   By: Corlis Leak M.D.   On: 05/31/2020 13:22    ____________________________________________   PROCEDURES  Procedure(s) performed: None  Procedures  Critical Care performed: No  ____________________________________________   INITIAL IMPRESSION / ASSESSMENT AND PLAN   55 year old female presents to the emergency department for  symptoms as described in the HPI.  Plan will be to review the chest x-ray, labs, and documentation from last hospital admission.  As part of my medical decision making, I reviewed the following data within the electronic MEDICAL RECORD NUMBER Old EKG reviewed, Old chart reviewed, Notes from prior ED visits and  Controlled Substance Database   ____________________________________________  Differential diagnosis includes, but not limited to:  COVID-19, PE, pneumonia, acute cystitis, transaminitis  ED COURSE  Labs including D-dimer reassuring.  Glucose decreased to 279 after insulin and IV fluids.  Results discussed with the patient.  She will be given a referral to pulmonology due to the persistent dyspnea since having  COVID 19 in January.  She will be prescribed Keflex for treatment of UTI.  She was encouraged to follow-up with her primary care provider in about 10 days or so to make sure that the infection has cleared.  She was also encouraged to return to the emergency department for symptoms that change or worsen if she is unable to schedule an appointment with primary care or the specialist.     FINAL CLINICAL IMPRESSION(S) / ED DIAGNOSES  Final diagnoses:  Shortness of breath  Acute cystitis without hematuria  Hyperglycemia     ED Discharge Orders         Ordered    ketorolac (TORADOL) 10 MG tablet  Every 6 hours PRN       Note to Pharmacy: Injection given in ER   05/31/20 1629    cephALEXin (KEFLEX) 500 MG capsule  2 times daily        05/31/20 1629           Nadja Cantalupo Pavelko was evaluated in Emergency Department on 05/31/2020 for the symptoms described in the history of present illness. She was evaluated in the context of the global COVID-19 pandemic, which necessitated consideration that the patient might be at risk for infection with the SARS-CoV-2 virus that causes COVID-19. Institutional protocols and algorithms that pertain to the evaluation of patients at  risk for COVID-19 are in a state of rapid change based on information released by regulatory bodies including the CDC and federal and state organizations. These policies and algorithms were followed during the patient's care in the ED.   Note:  This document was prepared using Dragon voice recognition software and may include unintentional dictation errors.   Chinita Pester, FNP 05/31/20 2023    Shaune Pollack, MD 06/05/20 [phone]

## 2020-05-31 NOTE — Discharge Instructions (Signed)
Please see primary care in about 10 days to make sure the UTI has cleared.  Follow up with pulmonology for chronic shortness of breath after COVID 19.

## 2020-05-31 NOTE — ED Notes (Signed)
Phlebotomy at bedside.

## 2020-05-31 NOTE — ED Notes (Signed)
Lab called for phlebotomy draw

## 2020-05-31 NOTE — ED Notes (Signed)
IV attempted x2

## 2020-05-31 NOTE — ED Triage Notes (Addendum)
Patient c/o sob, urinary symptoms (cloudy urine, burning during urination), and blurry vision in the past week. Reports she has been intermittently sob since getting diagnosed with covid January 11,2022. Able to speak in complete sentences. NAD noted. Patient drove self to ER

## 2020-06-03 LAB — URINE CULTURE: Culture: >=100000 — AB

## 2020-06-15 ENCOUNTER — Emergency Department: Payer: 59

## 2020-06-15 ENCOUNTER — Other Ambulatory Visit: Payer: Self-pay

## 2020-06-15 ENCOUNTER — Emergency Department
Admission: EM | Admit: 2020-06-15 | Discharge: 2020-06-15 | Disposition: A | Discharge status: Home / Self Care | Payer: 59 | Attending: Emergency Medicine | Admitting: Emergency Medicine

## 2020-06-15 DIAGNOSIS — Z7984 Long term (current) use of oral hypoglycemic drugs: Secondary | ICD-10-CM | POA: Diagnosis not present

## 2020-06-15 DIAGNOSIS — E039 Hypothyroidism, unspecified: Secondary | ICD-10-CM | POA: Diagnosis not present

## 2020-06-15 DIAGNOSIS — R110 Nausea: Secondary | ICD-10-CM | POA: Insufficient documentation

## 2020-06-15 DIAGNOSIS — Z8616 Personal history of COVID-19: Secondary | ICD-10-CM | POA: Diagnosis not present

## 2020-06-15 DIAGNOSIS — R109 Unspecified abdominal pain: Secondary | ICD-10-CM | POA: Diagnosis present

## 2020-06-15 DIAGNOSIS — Z7982 Long term (current) use of aspirin: Secondary | ICD-10-CM | POA: Diagnosis not present

## 2020-06-15 DIAGNOSIS — E119 Type 2 diabetes mellitus without complications: Secondary | ICD-10-CM | POA: Insufficient documentation

## 2020-06-15 DIAGNOSIS — R1012 Left upper quadrant pain: Secondary | ICD-10-CM

## 2020-06-15 DIAGNOSIS — Z79899 Other long term (current) drug therapy: Secondary | ICD-10-CM | POA: Insufficient documentation

## 2020-06-15 DIAGNOSIS — K5900 Constipation, unspecified: Secondary | ICD-10-CM | POA: Insufficient documentation

## 2020-06-15 DIAGNOSIS — R300 Dysuria: Secondary | ICD-10-CM | POA: Diagnosis not present

## 2020-06-15 DIAGNOSIS — Z794 Long term (current) use of insulin: Secondary | ICD-10-CM | POA: Diagnosis not present

## 2020-06-15 LAB — CBC
HCT: 41 % (ref 36.0–46.0)
Hemoglobin: 14 g/dL (ref 12.0–15.0)
MCH: 29.4 pg (ref 26.0–34.0)
MCHC: 34.1 g/dL (ref 30.0–36.0)
MCV: 86 fL (ref 80.0–100.0)
Platelets: 268 10*3/uL (ref 150–400)
RBC: 4.77 MIL/uL (ref 3.87–5.11)
RDW: 13.8 % (ref 11.5–15.5)
WBC: 8.1 10*3/uL (ref 4.0–10.5)
nRBC: 0 % (ref 0.0–0.2)

## 2020-06-15 LAB — COMPREHENSIVE METABOLIC PANEL
ALT: 57 U/L — ABNORMAL HIGH (ref 0–44)
AST: 45 U/L — ABNORMAL HIGH (ref 15–41)
Albumin: 4.7 g/dL (ref 3.5–5.0)
Alkaline Phosphatase: 114 U/L (ref 38–126)
Anion gap: 11 (ref 5–15)
BUN: 16 mg/dL (ref 6–20)
CO2: 22 mmol/L (ref 22–32)
Calcium: 9.4 mg/dL (ref 8.9–10.3)
Chloride: 101 mmol/L (ref 98–111)
Creatinine, Ser: 0.87 mg/dL (ref 0.44–1.00)
GFR, Estimated: >60 mL/min (ref >60)
Glucose, Bld: 408 mg/dL — ABNORMAL HIGH (ref 70–99)
Potassium: 4.1 mmol/L (ref 3.5–5.1)
Sodium: 134 mmol/L — ABNORMAL LOW (ref 135–145)
Total Bilirubin: 1 mg/dL (ref 0.3–1.2)
Total Protein: 7.4 g/dL (ref 6.5–8.1)

## 2020-06-15 LAB — URINALYSIS, ROUTINE W REFLEX MICROSCOPIC
Bacteria, UA: NONE SEEN
Bilirubin Urine: NEGATIVE
Glucose, UA: >=500 mg/dL — AB
Hgb urine dipstick: NEGATIVE
Ketones, ur: NEGATIVE mg/dL
Leukocytes,Ua: NEGATIVE
Nitrite: NEGATIVE
Protein, ur: NEGATIVE mg/dL
Specific Gravity, Urine: 1.032 — ABNORMAL HIGH (ref 1.005–1.030)
pH: 6 (ref 5.0–8.0)

## 2020-06-15 LAB — LIPASE, BLOOD: Lipase: 43 U/L (ref 11–51)

## 2020-06-15 LAB — PREGNANCY, URINE: Preg Test, Ur: NEGATIVE

## 2020-06-15 MED ORDER — MORPHINE SULFATE (PF) 4 MG/ML IV SOLN
4.0000 mg | Freq: Once | INTRAVENOUS | Status: AC
  Administered 2020-06-15: 4 mg via INTRAVENOUS
  Filled 2020-06-15: qty 1

## 2020-06-15 MED ORDER — ONDANSETRON HCL 4 MG/2ML IJ SOLN
4.0000 mg | Freq: Once | INTRAMUSCULAR | Status: AC
  Administered 2020-06-15: 4 mg via INTRAVENOUS
  Filled 2020-06-15: qty 2

## 2020-06-15 MED ORDER — FLUCONAZOLE 50 MG PO TABS
150.0000 mg | ORAL_TABLET | Freq: Once | ORAL | Status: AC
  Administered 2020-06-15: 150 mg via ORAL
  Filled 2020-06-15: qty 1

## 2020-06-15 MED ORDER — SODIUM CHLORIDE 0.9 % IV BOLUS
1000.0000 mL | Freq: Once | INTRAVENOUS | Status: AC
  Administered 2020-06-15: 1000 mL via INTRAVENOUS

## 2020-06-15 MED ORDER — POLYETHYLENE GLYCOL 3350 17 GM/SCOOP PO POWD: 17.0000 g | Freq: Every day | ORAL | 0 refills | Status: AC | PRN

## 2020-06-15 NOTE — ED Notes (Signed)
Patient transported to CT 

## 2020-06-15 NOTE — ED Triage Notes (Signed)
Pt is complaining of Urinary symptoms and dizziness, states that she has urosepsis a few weeks ago and put on antibiotics states that it got better and now symptoms are coming back

## 2020-06-15 NOTE — ED Provider Notes (Signed)
[facility] Emergency Department Provider Note  Time seen: 11:33 PM  I have reviewed the triage vital signs and the nursing notes.   HISTORY  Chief Complaint Dizziness and Urinary Frequency  HPI Whitney Thornton is a 55 y.o. female with a past medical history of diabetes, hyperlipidemia, presents emergency department for dysuria and left flank pain.  According to the patient approximately 2 weeks ago she was diagnosed with urinary tract infection which she with antibiotics states she improved but over the last 2 days or so has been experiencing dysuria once again.  Patient also states she has had some vaginal itching consistent with yeast infection which she gets when she takes antibiotics.  Patient states today she was experiencing some pain in her left flank which came to the emergency department to make sure she did not have another infection.  States she has been somewhat nauseated as well.  No fever.  Largely negative review of systems.   Past Medical History:  Diagnosis Date  . Diabetes mellitus without complication (HCC)   . Hyperlipidemia   . Hypothyroid   . Nephritis     Patient Active Problem List   Diagnosis Date Noted  . Pericardial effusion 02/18/2020  . Personal history of COVID-19 02/18/2020  . Transaminitis 02/18/2020  . Thyroid nodule 02/18/2020  . SOB (shortness of breath) 02/18/2020  . Hypothyroidism 02/18/2020    Past Surgical History:  Procedure Laterality Date  . ABDOMINAL HYSTERECTOMY    . CESAREAN SECTION    . CHOLECYSTECTOMY    . KNEE SURGERY Bilateral   . SINUS EXPLORATION     sinus surgery    Prior to Admission medications   Medication Sig Start Date End Date Taking? Authorizing Provider  aspirin EC 81 MG tablet Take 81 mg by mouth at bedtime.    [provider]  cyclobenzaprine (FLEXERIL) 10 MG tablet Take 1 tablet (10 mg total) by mouth 3 (three) times daily as needed for muscle spasms. 02/01/20    Magnant, Charles L, PA-C  diphenhydramine-acetaminophen (TYLENOL PM) 25-500 MG TABS tablet Take 1 tablet by mouth at bedtime as needed (sleep/pain).    [provider]  ezetimibe (ZETIA) 10 MG tablet Take 1 tablet (10 mg total) by mouth daily. 04/04/20   Grayce Sessions, NP  furosemide (LASIX) 40 MG tablet Take 1 tablet (40 mg total) by mouth daily as needed for fluid or edema. 02/20/20 02/19/21  Leatha Gilding, MD  glipiZIDE (GLUCOTROL XL) 10 MG 24 hr tablet Take 1 tablet (10 mg total) by mouth 2 (two) times daily after a meal. 04/04/20   Grayce Sessions, NP  Insulin Glargine (BASAGLAR KWIKPEN) 100 UNIT/ML Inject 0.15 mLs (15 Units total) into the skin at bedtime. Patient taking differently: Inject 15 Units into the skin at bedtime as needed (high blood sugar). 07/04/19   Grayce Sessions, NP  ketorolac (TORADOL) 10 MG tablet Take 1 tablet (10 mg total) by mouth every 6 (six) hours as needed. 05/31/20   Triplett, Rulon Eisenmenger B, FNP  levothyroxine (SYNTHROID) 100 MCG tablet Take 1 tablet (100 mcg total) by mouth daily. 04/04/20   Grayce Sessions, NP  meloxicam (MOBIC) 15 MG tablet Take 1 tablet (15 mg total) by mouth daily. 04/04/20   Grayce Sessions, NP  metFORMIN (GLUCOPHAGE-XR) 500 MG 24 hr tablet TAKE 2 TABLETS TWICE A DAY WITH A MEAL 04/04/20   Grayce Sessions, NP  potassium chloride SA (KLOR-CON) 20 MEQ tablet Take 1  tablet (20 mEq total) by mouth daily as needed (when you take Lasix). 02/20/20   Leatha Gilding, MD  Pseudoeph-Doxylamine-DM-APAP (NYQUIL PO) Take 2 capsules by mouth at bedtime.    [provider]  simvastatin (ZOCOR) 40 MG tablet Take 1 tablet (40 mg total) by mouth at bedtime. 04/04/20   Grayce Sessions, NP  VALIUM 10 MG tablet Take 10 mg by mouth at bedtime. 02/11/20   [provider]  albuterol (VENTOLIN HFA) 108 (90 Base) MCG/ACT inhaler Inhale 1 puff into the lungs every 6 (six) hours as needed for wheezing or shortness of breath. 02/10/20  02/14/20  Grayce Sessions, NP    Allergies  Allergen Reactions  . Celebrex [Celecoxib] Other (See Comments)    Patients reports weakness/ signs of stroke when she takes this.    Family History  Problem Relation Age of Onset  . Hypothyroidism Mother   . Diabetes Mother   . Heart disease Father   . Heart disease Brother     Social History Social History   Tobacco Use  . Smoking status: Never Smoker  . Smokeless tobacco: Never Used  Substance Use Topics  . Alcohol use: Yes    Comment: Rare  . Drug use: Not Currently    Review of Systems Constitutional: Negative for fever. Cardiovascular: Negative for chest pain. Respiratory: Negative for shortness of breath. Gastrointestinal: Left-sided abdominal pain.  Positive for nausea. Genitourinary: Mild dysuria. Musculoskeletal: Negative for musculoskeletal complaints Neurological: Negative for headache All other ROS negative  ____________________________________________   PHYSICAL EXAM:  VITAL SIGNS: ED Triage Vitals  Enc Vitals Group     BP 06/15/20 2139 125/78     Pulse Rate 06/15/20 2139 92     Resp 06/15/20 2139 20     Temp 06/15/20 2139 98 F (36.7 C)     Temp Source 06/15/20 2139 Oral     SpO2 06/15/20 2139 98 %     Weight 06/15/20 2140 184 lb 15.5 oz (83.9 kg)     Height 06/15/20 2140 5\' 6"  (1.676 m)     Head Circumference --      Peak Flow --      Pain Score 06/15/20 2140 4     Pain Loc --      Pain Edu? --      Excl. in GC? --    Constitutional: Alert and oriented. Well appearing and in no distress. Eyes: Normal exam ENT      Head: Normocephalic and atraumatic      Mouth/Throat: Mucous membranes are moist. Cardiovascular: Normal rate, regular rhythm.  Respiratory: Normal respiratory effort without tachypnea nor retractions. Breath sounds are clear Gastrointestinal: Soft and nontender. No distention.  Benign abdomen.  No CVA tenderness. Musculoskeletal: Nontender with normal range of motion in all  extremities.  Neurologic:  Normal speech and language. No gross focal neurologic deficits  Skin:  Skin is warm, dry and intact.  Psychiatric: Mood and affect are normal.   ____________________________________________   RADIOLOGY  IMPRESSION:  1. Moderate fecal retention compatible with constipation. No bowel  obstruction or ileus.  2. Otherwise unremarkable unenhanced exam. No urinary tract calculi  or obstructive uropathy.  3. Aortic Atherosclerosis (ICD10-I70.0).   ____________________________________________   INITIAL IMPRESSION / ASSESSMENT AND PLAN / ED COURSE  Pertinent labs & imaging results that were available during my care of the patient were reviewed by me and considered in my medical decision making (see chart for details).   Patient presents emergency  department for left-sided abdominal pain and mild dysuria.  Patient recently completed a course of antibiotics for urinary tract infection.  Patient's work-up today is overall reassuring.  Lab work largely within normal limits including a normal-appearing urinalysis.  We will dose Diflucan given the patient's complaint of vaginal itching denies any discharge.  Patient CT is resulted showing moderate constipation which could very likely be the cause of the patient's discomfort.  Given the patient's otherwise reassuring work-up we will discharge home with MiraLAX and have the patient follow-up with her PCP.  Whitney Thornton was evaluated in Emergency Department on 06/15/2020 for the symptoms described in the history of present illness. She was evaluated in the context of the global COVID-19 pandemic, which necessitated consideration that the patient might be at risk for infection with the SARS-CoV-2 virus that causes COVID-19. Institutional protocols and algorithms that pertain to the evaluation of patients at risk for COVID-19 are in a state of rapid change based on information released by regulatory bodies including  the CDC and federal and state organizations. These policies and algorithms were followed during the patient's care in the ED.  ____________________________________________   FINAL CLINICAL IMPRESSION(S) / ED DIAGNOSES  Constipation Abdominal pain   Minna Antis, MD 06/15/20 2336

## 2020-06-17 LAB — URINE CULTURE

## 2020-06-18 ENCOUNTER — Telehealth (INDEPENDENT_AMBULATORY_CARE_PROVIDER_SITE_OTHER): Payer: Self-pay

## 2020-06-18 NOTE — Telephone Encounter (Signed)
Copied from CRM [id number]. Topic: General - Other >> Jun 17, 2020  4:12 PM Whitney Thornton wrote: Reason for CRM: Patient would like to be contacted regarding Thornton urine sample recently submitted on 06/17/20  Patient would like to be contacted by their PCP to discuss the sample further when possible

## 2020-06-18 NOTE — Telephone Encounter (Signed)
Copied from CRM [id number]. Topic: Referral - Request for Referral >> Jun 17, 2020  4:15 PM Gaetana Michaelis A wrote: Has patient seen PCP for this complaint? No  *If NO, is insurance requiring patient see PCP for this issue before PCP can refer them?  Referral for which specialty: Hepatology  Preferred provider/office: Patient has no preference   Reason for referral: Patient submitted a urine sample recently and has concerns related to levels

## 2020-06-18 NOTE — Telephone Encounter (Signed)
Called patient to explain PCP did not order urine culture - no answer left voice message to call [facility]

## 2020-07-04 ENCOUNTER — Ambulatory Visit: Payer: Self-pay

## 2020-07-04 ENCOUNTER — Ambulatory Visit (INDEPENDENT_AMBULATORY_CARE_PROVIDER_SITE_OTHER): Payer: 59 | Admitting: Surgical

## 2020-07-04 DIAGNOSIS — M25561 Pain in right knee: Secondary | ICD-10-CM | POA: Diagnosis not present

## 2020-07-05 ENCOUNTER — Encounter: Payer: Self-pay | Admitting: Surgical
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
## 2020-07-11 ENCOUNTER — Ambulatory Visit (INDEPENDENT_AMBULATORY_CARE_PROVIDER_SITE_OTHER): Payer: 59 | Admitting: Endocrinology

## 2020-07-11 ENCOUNTER — Other Ambulatory Visit: Payer: Self-pay

## 2020-07-11 VITALS — BP 108/64 | HR 84 | Ht 66.0 in | Wt 181.4 lb

## 2020-07-11 DIAGNOSIS — E119 Type 2 diabetes mellitus without complications: Secondary | ICD-10-CM | POA: Diagnosis not present

## 2020-07-11 DIAGNOSIS — E041 Nontoxic single thyroid nodule: Secondary | ICD-10-CM | POA: Diagnosis not present

## 2020-07-11 LAB — POCT GLYCOSYLATED HEMOGLOBIN (HGB A1C): Hemoglobin A1C: 10.4 % — AB (ref 4.0–5.6)

## 2020-07-11 MED ORDER — LANTUS SOLOSTAR 100 UNIT/ML ~~LOC~~ SOPN: 20.0000 [IU] | PEN_INJECTOR | SUBCUTANEOUS | 99 refills | Status: DC

## 2020-07-11 MED ORDER — ONETOUCH VERIO VI STRP: 1.0000 | ORAL_STRIP | Freq: Every day | 12 refills | Status: DC

## 2020-07-11 NOTE — Patient Instructions (Addendum)
Let's check the ultrasound.  you will receive a phone call, about a day and time for an appointment. good diet and exercise significantly improve the control of your diabetes.  please let me know if you wish to be referred to a dietician.  high blood sugar is very risky to your health.  you should see an eye doctor and dentist every year.  It is very important to get all recommended vaccinations.   Controlling your blood pressure and cholesterol drastically reduces the damage diabetes does to your body.  Those who smoke should quit.  Please discuss these with your doctor.   check your blood sugar once a day.  vary the time of day when you check, between before the 3 meals, and at bedtime.  also check if you have symptoms of your blood sugar being too high or too low.  please keep a record of the readings and bring it to your next appointment here (or you can bring the meter itself).  You can write it on any piece of paper.  please call us sooner if your blood sugar goes below 70, or if most of your readings are over 200.   I have sent a prescription to your pharmacy, to start insulin.  Please call if this is too expensive.   Please come back for a follow-up appointment in 2 months.

## 2020-07-11 NOTE — Progress Notes (Signed)
Subjective:    Patient ID: Whitney Thornton, female    DOB: December 31, 1965, 55 y.o.   MRN: [id number]  HPI pt is referred by Gwinda Passe, NP, for diabetes.  Pt states DM was dx'ed in 2018; she is unaware of any chronic complications; she took insulin intermittently since dx, but not in the past few months; pt says her diet and exercise are good; she has never had GDM, pancreatitis, pancreatic surgery, severe hypoglycemia, or DKA;  She takes 2 oral meds.  She does not check cbg.  She declines multiple daily injections.    Past Medical History:  Diagnosis Date   Diabetes mellitus without complication (HCC)    Hyperlipidemia    Hypothyroid    Nephritis     Past Surgical History:  Procedure Laterality Date   ABDOMINAL HYSTERECTOMY     CESAREAN SECTION     CHOLECYSTECTOMY     KNEE SURGERY Bilateral    SINUS EXPLORATION     sinus surgery    Social History   Socioeconomic History   Marital status: Married    Spouse name: Not on file   Number of children: 4   Years of education: Not on file   Highest education level: Not on file  Occupational History   Not on file  Tobacco Use   Smoking status: Never   Smokeless tobacco: Never  Substance and Sexual Activity   Alcohol use: Yes    Comment: Rare   Drug use: Not Currently   Sexual activity: Not on file  Other Topics Concern   Not on file  Social History Narrative   Not on file   Social Determinants of Health   Financial Resource Strain: Not on file  Food Insecurity: Not on file  Transportation Needs: Not on file  Physical Activity: Not on file  Stress: Not on file  Social Connections: Not on file  Intimate Partner Violence: Not on file    Current Outpatient Medications on File Prior to Visit  Medication Sig Dispense Refill   aspirin EC 81 MG tablet Take 81 mg by mouth at bedtime.     cyclobenzaprine (FLEXERIL) 10 MG tablet Take 1 tablet (10 mg total) by mouth 3 (three) times daily as needed for muscle  spasms. 60 tablet 3   diphenhydramine-acetaminophen (TYLENOL PM) 25-500 MG TABS tablet Take 1 tablet by mouth at bedtime as needed (sleep/pain).     ezetimibe (ZETIA) 10 MG tablet Take 1 tablet (10 mg total) by mouth daily. 90 tablet 0   furosemide (LASIX) 40 MG tablet Take 1 tablet (40 mg total) by mouth daily as needed for fluid or edema. 30 tablet 1   glipiZIDE (GLUCOTROL XL) 10 MG 24 hr tablet Take 1 tablet (10 mg total) by mouth 2 (two) times daily after a meal. 180 tablet 0   ketorolac (TORADOL) 10 MG tablet Take 1 tablet (10 mg total) by mouth every 6 (six) hours as needed. 20 tablet 0   levothyroxine (SYNTHROID) 100 MCG tablet Take 1 tablet (100 mcg total) by mouth daily. 90 tablet 0   meloxicam (MOBIC) 15 MG tablet Take 1 tablet (15 mg total) by mouth daily. 90 tablet 0   metFORMIN (GLUCOPHAGE-XR) 500 MG 24 hr tablet TAKE 2 TABLETS TWICE A DAY WITH A MEAL 180 tablet 0   polyethylene glycol powder (GLYCOLAX/MIRALAX) 17 GM/SCOOP powder Take 17 g by mouth daily as needed for moderate constipation. 255 g 0   potassium chloride SA (KLOR-CON) 20 MEQ tablet  Take 1 tablet (20 mEq total) by mouth daily as needed (when you take Lasix). 30 tablet 1   Pseudoeph-Doxylamine-DM-APAP (NYQUIL PO) Take 2 capsules by mouth at bedtime.     simvastatin (ZOCOR) 40 MG tablet Take 1 tablet (40 mg total) by mouth at bedtime. 90 tablet 0   VALIUM 10 MG tablet Take 10 mg by mouth at bedtime.     [DISCONTINUED] albuterol (VENTOLIN HFA) 108 (90 Base) MCG/ACT inhaler Inhale 1 puff into the lungs every 6 (six) hours as needed for wheezing or shortness of breath. 18 g 1   No current facility-administered medications on file prior to visit.    Allergies  Allergen Reactions   Celebrex [Celecoxib] Other (See Comments)    Patients reports weakness/ signs of stroke when she takes this.    Family History  Problem Relation Age of Onset   Hypothyroidism Mother    Diabetes Mother    Heart disease Father    Heart  disease Brother     BP 108/64 (BP Location: Right Arm, Patient Position: Sitting, Cuff Size: Normal)   Pulse 84   Ht 5\' 6"  (1.676 m)   Wt 181 lb 6.4 oz (82.3 kg)   SpO2 98%   BMI 29.28 kg/m     Review of Systems denies weight loss and n/v.  No change in chronic depression.     Objective:   Physical Exam NECK: ? Of 1-2 cm right thyroid nodule Pulses: dorsalis pedis intact bilat.   MSK: no deformity of the feet CV: no leg edema Skin:  no ulcer on the feet.  normal color and temp on the feet.  Neuro: sensation is intact to touch on the feet.    Lab Results  Component Value Date   TSH 0.238 (L) 07/30/2019   A1c=10.4%  CT: 2 cm thyroid nodule involving the inferior right thyroid gland.  Follow-up with an outpatient thyroid ultrasound is Lab Results  Component Value Date   CREATININE 0.87 06/15/2020   BUN 16 06/15/2020   NA 134 (L) 06/15/2020   K 4.1 06/15/2020   CL 101 06/15/2020   CO2 22 06/15/2020   I have reviewed outside records, and summarized: Pt was noted to have elevated A1c, and referred here.  However, referral said she was ref for thyroid    Assessment & Plan:  Thyroid nodule, new to me.  uncertain etiology and prognosis Type 2 DM: uncontrolled  Patient Instructions  Let's check the ultrasound.  you will receive a phone call, about a day and time for an appointment. good diet and exercise significantly improve the control of your diabetes.  please let me know if you wish to be referred to a dietician.  high blood sugar is very risky to your health.  you should see an eye doctor and dentist every year.  It is very important to get all recommended vaccinations.   Controlling your blood pressure and cholesterol drastically reduces the damage diabetes does to your body.  Those who smoke should quit.  Please discuss these with your doctor.   check your blood sugar once a day.  vary the time of day when you check, between before the 3 meals, and at bedtime.  also  check if you have symptoms of your blood sugar being too high or too low.  please keep a record of the readings and bring it to your next appointment here (or you can bring the meter itself).  You can write it on any piece  of paper.  please call us sooner if your blood sugar goes below 70, or if most of your readings are over 200.   I have sent a prescription to your pharmacy, to start insulin.  Please call if this is too expensive.   Please come back for a follow-up appointment in 2 months.

## 2020-07-18 ENCOUNTER — Encounter: Payer: Self-pay | Admitting: Endocrinology

## 2020-07-22 ENCOUNTER — Encounter: Payer: Self-pay | Admitting: Orthopedic Surgery

## 2020-07-23 ENCOUNTER — Encounter (INDEPENDENT_AMBULATORY_CARE_PROVIDER_SITE_OTHER): Payer: Self-pay | Admitting: Primary Care

## 2020-07-23 ENCOUNTER — Other Ambulatory Visit: Payer: Self-pay | Admitting: Surgical

## 2020-07-23 MED ORDER — CYCLOBENZAPRINE HCL 10 MG PO TABS: 10.0000 mg | ORAL_TABLET | Freq: Three times a day (TID) | ORAL | 3 refills | Status: DC | PRN

## 2020-07-23 NOTE — Telephone Encounter (Signed)
Sent in RX

## 2020-07-25 ENCOUNTER — Other Ambulatory Visit (INDEPENDENT_AMBULATORY_CARE_PROVIDER_SITE_OTHER): Payer: Self-pay | Admitting: Primary Care

## 2020-07-25 DIAGNOSIS — E119 Type 2 diabetes mellitus without complications: Secondary | ICD-10-CM

## 2020-07-25 DIAGNOSIS — E039 Hypothyroidism, unspecified: Secondary | ICD-10-CM

## 2020-07-25 NOTE — Telephone Encounter (Signed)
Notes to clinic:  Patient has appt on 08/08/2020 Review medication for refills Meds were last filled by Juluis Mire   Requested Prescriptions  Pending Prescriptions Disp Refills   simvastatin (ZOCOR) 40 MG tablet 90 tablet 0    Sig: Take 1 tablet (40 mg total) by mouth at bedtime.      Cardiovascular:  Antilipid - Statins Failed - 07/25/2020 11:10 AM      Failed - Total Cholesterol in normal range and within 360 days    Cholesterol, Total  Date Value Ref Range Status  03/15/2019 238 (H) 100 - 199 mg/dL Final          Failed - LDL in normal range and within 360 days    LDL Chol Calc (NIH)  Date Value Ref Range Status  03/15/2019 110 (H) 0 - 99 mg/dL Final          Failed - HDL in normal range and within 360 days    HDL  Date Value Ref Range Status  03/15/2019 37 (L) >39 mg/dL Final          Failed - Triglycerides in normal range and within 360 days    Triglycerides  Date Value Ref Range Status  03/15/2019 525 (H) 0 - 149 mg/dL Final          Passed - Patient is not pregnant      Passed - Valid encounter within last 12 months    Recent Outpatient Visits           5 months ago Exposure to COVID-19 virus   [place], Michelle P, NP   10 months ago Urinary tract infection without hematuria, site unspecified   [place], Michelle P, NP   11 months ago Dysuria   [place] Kerin Perna, NP   1 year ago Dysuria   [place]   1 year ago Type 2 diabetes mellitus without complication, without long-term current use of insulin ([place])   [place] [facility] Kerin Perna, NP       Future Appointments             In 2 weeks Oletta Lamas, [place] Cage, NP [facility]               metFORMIN (GLUCOPHAGE-XR) 500 MG 24 hr tablet 180 tablet 0    Sig: TAKE 2 TABLETS TWICE A DAY WITH A MEAL       Endocrinology:  Diabetes - Biguanides Failed - 07/25/2020 11:10 AM      Failed - HBA1C is between 0 and 7.9 and within 180 days    Hemoglobin A1C  Date Value Ref Range Status  07/11/2020 10.4 (A) 4.0 - 5.6 % Final          Passed - Cr in normal range and within 360 days    Creatinine, Ser  Date Value Ref Range Status  06/15/2020 0.87 0.44 - 1.00 mg/dL Final          Passed - eGFR in normal range and within 360 days    GFR calc Af Amer  Date Value Ref Range Status  07/30/2019 117 >59 mL/min/1.73 Final    Comment:    **[facility] currently reports eGFR in compliance with the current**   recommendations of the [employer]. [facility] will   update reporting as new guidelines are published from the NKF-ASN  Task force.    GFR, Estimated  Date Value Ref Range Status  06/15/2020 >60 >60 mL/min Final    Comment:    (NOTE) Calculated using the CKD-EPI Creatinine Equation (2021)           Passed - Valid encounter within last 6 months    Recent Outpatient Visits           5 months ago Exposure to COVID-19 virus   [place], [place], NP   10 months ago Urinary tract infection without hematuria, site unspecified   [place] Kerin Perna, NP   11 months ago Dysuria   [place] Juluis Mire P, NP   1 year ago Dysuria   [place]   1 year ago Type 2 diabetes mellitus without complication, without long-term current use of insulin ([place])   [place] [facility] Kerin Perna, NP       Future Appointments             In 2 weeks Oletta Lamas, Mil[place] Cage, NP [facility]               meloxicam (MOBIC) 15 MG tablet 90 tablet 0    Sig: Take 1 tablet (15 mg total) by mouth daily.      Analgesics:  COX2 Inhibitors Passed - 07/25/2020 11:10 AM      Passed - HGB in normal range and within 360 days     Hemoglobin  Date Value Ref Range Status  06/15/2020 14.0 12.0 - 15.0 g/dL Final  03/15/2019 15.3 11.1 - 15.9 g/dL Final          Passed - Cr in normal range and within 360 days    Creatinine, Ser  Date Value Ref Range Status  06/15/2020 0.87 0.44 - 1.00 mg/dL Final          Passed - Patient is not pregnant      Passed - Valid encounter within last 12 months    Recent Outpatient Visits           5 months ago Exposure to COVID-19 virus   [place], Michelle P, NP   10 months ago Urinary tract infection without hematuria, site unspecified   [place] Kerin Perna, NP   11 months ago Dysuria   [place] Juluis Mire P, NP   1 year ago Dysuria   [place]   1 year ago Type 2 diabetes mellitus without complication, without long-term current use of insulin ([place])   [place] [facility] Kerin Perna, NP       Future Appointments             In 2 weeks Oletta Lamas, Mil[place] Cage, NP [place]               ezetimibe (ZETIA) 10 MG tablet 90 tablet 0    Sig: Take 1 tablet (10 mg total) by mouth daily.      Cardiovascular:  Antilipid - Sterol Transport Inhibitors Failed - 07/25/2020 11:10 AM      Failed - Total Cholesterol in normal range and within 360 days    Cholesterol, Total  Date Value Ref Range Status  03/15/2019 238 (H) 100 - 199 mg/dL Final  Failed - LDL in normal range and within 360 days    LDL Chol Calc (NIH)  Date Value Ref Range Status  03/15/2019 110 (H) 0 - 99 mg/dL Final          Failed - HDL in normal range and within 360 days    HDL  Date Value Ref Range Status  03/15/2019 37 (L) >39 mg/dL Final          Failed - Triglycerides in normal range and within 360 days    Triglycerides  Date Value Ref Range Status  03/15/2019 525 (H) 0 - 149 mg/dL Final          Passed - Valid  encounter within last 12 months    Recent Outpatient Visits           5 months ago Exposure to COVID-19 virus   [place], Michelle P, NP   10 months ago Urinary tract infection without hematuria, site unspecified   [place], Michelle P, NP   11 months ago Dysuria   [place] Juluis Mire P, NP   1 year ago Dysuria   [place]   1 year ago Type 2 diabetes mellitus without complication, without long-term current use of insulin ([place])   [place] [facility] Kerin Perna, NP       Future Appointments             In 2 weeks Oletta Lamas, Milford Cage, NP [facility]               levothyroxine (SYNTHROID) 100 MCG tablet 90 tablet 0    Sig: Take 1 tablet (100 mcg total) by mouth daily.      Endocrinology:  Hypothyroid Agents Failed - 07/25/2020 11:10 AM      Failed - TSH needs to be rechecked within 3 months after an abnormal result. Refill until TSH is due.      Failed - TSH in normal range and within 360 days    TSH  Date Value Ref Range Status  07/30/2019 0.238 (L) 0.450 - 4.500 uIU/mL Final          Passed - Valid encounter within last 12 months    Recent Outpatient Visits           5 months ago Exposure to COVID-19 virus   [place], [place], NP   10 months ago Urinary tract infection without hematuria, site unspecified   [place] Kerin Perna, NP   11 months ago Dysuria   [place] Kerin Perna, NP   1 year ago Dysuria   [place]   1 year ago Type 2 diabetes mellitus without complication, without long-term current use of insulin ([place])   [place] [facility] Kerin Perna, NP       Future Appointments             In 2 weeks Oletta Lamas, Milford Cage, NP [place]

## 2020-07-25 NOTE — Telephone Encounter (Signed)
Pt called in to request a refill for her medications. Pt says that she now use [facility] and was told by them that they have sent several request to refill but hasnt received a response.    Medications:   ezetimibe (ZETIA) 10 MG tablet  levothyroxine (SYNTHROID) 100 MCG tablet  simvastatin (ZOCOR) 40 MG tablet metFORMIN (GLUCOPHAGE-XR) 500 MG 24 hr tablet meloxicam (MOBIC) 15 MG tablet glipiZIDE (GLUCOTROL XL) 10 MG 24 hr tablet    Pharmacy: [facility]

## 2020-07-27 ENCOUNTER — Other Ambulatory Visit (INDEPENDENT_AMBULATORY_CARE_PROVIDER_SITE_OTHER): Payer: Self-pay | Admitting: Primary Care

## 2020-07-27 MED ORDER — EZETIMIBE 10 MG PO TABS: 10.0000 mg | ORAL_TABLET | Freq: Every day | ORAL | 0 refills | Status: DC

## 2020-07-27 MED ORDER — METFORMIN HCL ER 500 MG PO TB24: ORAL_TABLET | ORAL | 0 refills | Status: DC

## 2020-07-27 MED ORDER — LEVOTHYROXINE SODIUM 100 MCG PO TABS: 100.0000 ug | ORAL_TABLET | Freq: Every day | ORAL | 0 refills | Status: DC

## 2020-07-27 MED ORDER — MELOXICAM 15 MG PO TABS: 15.0000 mg | ORAL_TABLET | Freq: Every day | ORAL | 0 refills | Status: DC

## 2020-07-27 MED ORDER — SIMVASTATIN 40 MG PO TABS: 40.0000 mg | ORAL_TABLET | Freq: Every day | ORAL | 0 refills | Status: DC

## 2020-08-08 ENCOUNTER — Other Ambulatory Visit: Payer: Self-pay

## 2020-08-08 ENCOUNTER — Encounter (INDEPENDENT_AMBULATORY_CARE_PROVIDER_SITE_OTHER): Payer: Self-pay | Admitting: Primary Care

## 2020-08-08 ENCOUNTER — Ambulatory Visit (INDEPENDENT_AMBULATORY_CARE_PROVIDER_SITE_OTHER): Payer: 59 | Admitting: Primary Care

## 2020-08-08 VITALS — BP 95/60 | HR 88 | Temp 98.2°F | Ht 66.0 in | Wt 187.2 lb

## 2020-08-08 DIAGNOSIS — R058 Other specified cough: Secondary | ICD-10-CM

## 2020-08-08 DIAGNOSIS — E782 Mixed hyperlipidemia: Secondary | ICD-10-CM

## 2020-08-08 DIAGNOSIS — J9801 Acute bronchospasm: Secondary | ICD-10-CM | POA: Diagnosis not present

## 2020-08-08 MED ORDER — DM-GUAIFENESIN ER 30-600 MG PO TB12: 1.0000 | ORAL_TABLET | Freq: Two times a day (BID) | ORAL | 1 refills | Status: DC

## 2020-08-08 MED ORDER — ALBUTEROL SULFATE HFA 108 (90 BASE) MCG/ACT IN AERS: 2.0000 | INHALATION_SPRAY | Freq: Four times a day (QID) | RESPIRATORY_TRACT | 1 refills | Status: DC | PRN

## 2020-08-08 NOTE — Progress Notes (Signed)
Established Patient Office Visit  Subjective:  Patient ID: Whitney Thornton, female    DOB: [birth year]  Age: 55 y.o. MRN: [id number]  CC:  Chief Complaint  Patient presents with   Medication Refill    HPI [name] is a 55 year old obese female who presents for initially hypothyroid and type 2 diabetes.  Referred to endocrinology Dr. Everardo All reviewed notes and prescribed insulin and will be doing a thyroid scan.  Therefore will defer type 2 diabetes and hypothyroidism to endocrinology.  Continue to manage hyper lipidemia.  Past Medical History:  Diagnosis Date   Diabetes mellitus without complication (HCC)    Hyperlipidemia    Hypothyroid    Nephritis     Past Surgical History:  Procedure Laterality Date   ABDOMINAL HYSTERECTOMY     CESAREAN SECTION     CHOLECYSTECTOMY     KNEE SURGERY Bilateral    SINUS EXPLORATION     sinus surgery    Family History  Problem Relation Age of Onset   Hypothyroidism Mother    Diabetes Mother    Heart disease Father    Heart disease Brother     Social History   Socioeconomic History   Marital status: Married    Spouse name: Not on file   Number of children: 4   Years of education: Not on file   Highest education level: Not on file  Occupational History   Not on file  Tobacco Use   Smoking status: Never   Smokeless tobacco: Never  Substance and Sexual Activity   Alcohol use: Yes    Comment: Rare   Drug use: Not Currently   Sexual activity: Not on file  Other Topics Concern   Not on file  Social History Narrative   Not on file   Social Determinants of Health   Financial Resource Strain: Not on file  Food Insecurity: Not on file  Transportation Needs: Not on file  Physical Activity: Not on file  Stress: Not on file  Social Connections: Not on file  Intimate Partner Violence: Not on file    Outpatient Medications Prior to Visit  Medication Sig Dispense Refill   aspirin EC 81 MG  tablet Take 81 mg by mouth at bedtime.     cyclobenzaprine (FLEXERIL) 10 MG tablet Take 1 tablet (10 mg total) by mouth 3 (three) times daily as needed for muscle spasms. 60 tablet 3   diphenhydramine-acetaminophen (TYLENOL PM) 25-500 MG TABS tablet Take 1 tablet by mouth at bedtime as needed (sleep/pain).     ezetimibe (ZETIA) 10 MG tablet Take 1 tablet (10 mg total) by mouth daily. 15 tablet 0   furosemide (LASIX) 40 MG tablet Take 1 tablet (40 mg total) by mouth daily as needed for fluid or edema. 30 tablet 1   glipiZIDE (GLUCOTROL XL) 10 MG 24 hr tablet Take 1 tablet (10 mg total) by mouth 2 (two) times daily after a meal. 180 tablet 0   glucose blood (ONETOUCH VERIO) test strip 1 each by Other route daily. And lancets 1/day 100 each 12   insulin glargine (LANTUS SOLOSTAR) 100 UNIT/ML Solostar Pen Inject 20 Units into the skin every morning. And pen needles 1/day 15 mL PRN   ketorolac (TORADOL) 10 MG tablet Take 1 tablet (10 mg total) by mouth every 6 (six) hours as needed. 20 tablet 0   levothyroxine (SYNTHROID) 100 MCG tablet Take 1 tablet (100 mcg total) by mouth daily. 15 tablet 0   metFORMIN (GLUCOPHAGE-XR)  500 MG 24 hr tablet TAKE 2 TABLETS TWICE A DAY WITH A MEAL 30 tablet 0   polyethylene glycol powder (GLYCOLAX/MIRALAX) 17 GM/SCOOP powder Take 17 g by mouth daily as needed for moderate constipation. 255 g 0   potassium chloride SA (KLOR-CON) 20 MEQ tablet Take 1 tablet (20 mEq total) by mouth daily as needed (when you take Lasix). 30 tablet 1   simvastatin (ZOCOR) 40 MG tablet Take 1 tablet (40 mg total) by mouth at bedtime. 15 tablet 0   VALIUM 10 MG tablet Take 10 mg by mouth at bedtime.     Pseudoeph-Doxylamine-DM-APAP (NYQUIL PO) Take 2 capsules by mouth at bedtime.     No facility-administered medications prior to visit.    Allergies  Allergen Reactions   Celebrex [Celecoxib] Other (See Comments)    Patients reports weakness/ signs of stroke when she takes this.     ROS Review of Systems  Respiratory:  Positive for cough.        Productive  Neurological:  Positive for dizziness and headaches.  All other systems reviewed and are negative.    Objective:    Vitals:   08/08/20 0935  BP: 95/60  Pulse: 88  Temp: 98.2 F (36.8 C)  TempSrc: Oral  SpO2: 98%  Weight: 187 lb 3.2 oz (84.9 kg)  Height: 5\' 6"  (1.676 m)   Physical Exam General: Vital signs reviewed.  Patient is well-developed and well-nourished, obese female,in no acute distress and cooperative with exam.  Head: Normocephalic and atraumatic. Eyes: EOMI, conjunctivae normal, no scleral icterus.  Neck: Supple, trachea midline, normal ROM, no JVD, masses, thyromegaly, or carotid bruit present.  Cardiovascular: RRR, S1 normal, S2 normal, no murmurs, gallops, or rubs. Pulmonary/Chest: Clear to auscultation bilaterally Left , RLL wheezes,  Abdominal: Soft, non-tender, non-distended, BS +, no masses, organomegaly, or guarding present.  Musculoskeletal: No joint deformities, erythema, or stiffness, ROM full and nontender. Extremities: No lower extremity edema bilaterally,  pulses symmetric and intact bilaterally. No cyanosis or clubbing. Neurological: A&O x3, Strength is normal and symmetric bilaterally, cranial nerve II-XII are grossly intact, no focal motor deficit, sensory intact to light touch bilaterally.  Skin: Warm, dry and intact. No rashes or erythema. Psychiatric: Normal mood and affect. speech and behavior is normal. Cognition and memory are normal.     Health Maintenance Due  Topic Date Due   COVID-19 Vaccine (3 - Booster for Pfizer series) 10/21/2019   URINE MICROALBUMIN  03/14/2020    There are no preventive care reminders to display for this patient.  Lab Results  Component Value Date   TSH 0.238 (L) 07/30/2019   Lab Results  Component Value Date   WBC 8.1 06/15/2020   HGB 14.0 06/15/2020   HCT 41.0 06/15/2020   MCV 86.0 06/15/2020   PLT 268 06/15/2020   Lab  Results  Component Value Date   NA 134 (L) 06/15/2020   K 4.1 06/15/2020   CO2 22 06/15/2020   GLUCOSE 408 (H) 06/15/2020   BUN 16 06/15/2020   CREATININE 0.87 06/15/2020   BILITOT 1.0 06/15/2020   ALKPHOS 114 06/15/2020   AST 45 (H) 06/15/2020   ALT 57 (H) 06/15/2020   PROT 7.4 06/15/2020   ALBUMIN 4.7 06/15/2020   CALCIUM 9.4 06/15/2020   ANIONGAP 11 06/15/2020   Lab Results  Component Value Date   CHOL 238 (H) 03/15/2019   Lab Results  Component Value Date   HDL 37 (L) 03/15/2019   Lab Results  Component  Value Date   LDLCALC 110 (H) 03/15/2019   Lab Results  Component Value Date   TRIG 525 (H) 03/15/2019   Lab Results  Component Value Date   CHOLHDL 6.4 (H) 03/15/2019   Lab Results  Component Value Date   HGBA1C 10.4 (A) 07/11/2020      Assessment & Plan:  Chisa was seen today for medication refill.  Diagnoses and all orders for this visit:  Productive cough -     dextromethorphan-guaiFENesin (MUCINEX DM) 30-600 MG 12hr tablet; Take 1 tablet by mouth 2 (two) times daily.  Mixed hyperlipidemia  Healthy lifestyle diet of fruits vegetables fish nuts whole grains and low saturated fat . Foods high in cholesterol or liver, fatty meats,cheese, butter avocados, nuts and seeds, chocolate and fried foods.  -     Lipid Panel  Class 2 severe obesity due to excess calories with serious comorbidity in adult, unspecified BMI (HCC) Obesity is 30-39 indicating an excess in caloric intake or underlining conditions. This may lead to other co-morbidities. Lifestyle modifications of diet and exercise may reduce obesity.   -     Lipid Panel  Bronchospasm -     albuterol (VENTOLIN HFA) 108 (90 Base) MCG/ACT inhaler; Inhale 2 puffs into the lungs every 6 (six) hours as needed for wheezing or shortness of breath.     Follow-up: No follow-ups on file.    Grayce Sessions, NP

## 2020-08-08 NOTE — Patient Instructions (Signed)

## 2020-08-09 LAB — LIPID PANEL
Chol/HDL Ratio: 5 ratio — ABNORMAL HIGH (ref 0.0–4.4)
Cholesterol, Total: 161 mg/dL (ref 100–199)
HDL: 32 mg/dL — ABNORMAL LOW (ref >39)
LDL Chol Calc (NIH): 67 mg/dL (ref 0–99)
Triglycerides: 398 mg/dL — ABNORMAL HIGH (ref 0–149)
VLDL Cholesterol Cal: 62 mg/dL — ABNORMAL HIGH (ref 5–40)

## 2020-08-12 ENCOUNTER — Telehealth: Payer: Self-pay | Admitting: Endocrinology

## 2020-08-12 NOTE — Telephone Encounter (Signed)
Medications to be Sent to [facility] - patient called to advise that the medications listed below need to be sent to [facility] to be covered by her insurance - 90 day supply  Medications:  Lantus Solostar One Touch Test Strips Levothyroxine Metformin XR 500 MG Glipizide XR 10 MG

## 2020-08-13 ENCOUNTER — Other Ambulatory Visit (INDEPENDENT_AMBULATORY_CARE_PROVIDER_SITE_OTHER): Payer: Self-pay | Admitting: Primary Care

## 2020-08-13 ENCOUNTER — Other Ambulatory Visit: Payer: Self-pay

## 2020-08-13 DIAGNOSIS — E782 Mixed hyperlipidemia: Secondary | ICD-10-CM

## 2020-08-13 DIAGNOSIS — E039 Hypothyroidism, unspecified: Secondary | ICD-10-CM

## 2020-08-13 DIAGNOSIS — E119 Type 2 diabetes mellitus without complications: Secondary | ICD-10-CM

## 2020-08-13 MED ORDER — ONETOUCH VERIO VI STRP: 1.0000 | ORAL_STRIP | Freq: Every day | 12 refills | Status: DC

## 2020-08-13 MED ORDER — EZETIMIBE 10 MG PO TABS
10.0000 mg | ORAL_TABLET | Freq: Every day | ORAL | 1 refills | Status: DC
Start: 2020-08-13 — End: 2021-01-07

## 2020-08-13 MED ORDER — LEVOTHYROXINE SODIUM 100 MCG PO TABS: 100.0000 ug | ORAL_TABLET | Freq: Every day | ORAL | 0 refills | Status: DC

## 2020-08-13 MED ORDER — GLIPIZIDE ER 10 MG PO TB24: 10.0000 mg | ORAL_TABLET | Freq: Two times a day (BID) | ORAL | 0 refills | Status: DC

## 2020-08-13 MED ORDER — SIMVASTATIN 80 MG PO TABS: 80.0000 mg | ORAL_TABLET | Freq: Every day | ORAL | 1 refills | Status: DC

## 2020-08-13 MED ORDER — LANTUS SOLOSTAR 100 UNIT/ML ~~LOC~~ SOPN: 20.0000 [IU] | PEN_INJECTOR | SUBCUTANEOUS | 99 refills | Status: DC

## 2020-08-13 MED ORDER — METFORMIN HCL ER 500 MG PO TB24: ORAL_TABLET | ORAL | 0 refills | Status: DC

## 2020-08-19 ENCOUNTER — Encounter: Payer: Self-pay | Admitting: Endocrinology

## 2020-08-19 ENCOUNTER — Ambulatory Visit
Admission: RE | Admit: 2020-08-19 | Discharge: 2020-08-19 | Disposition: A | Discharge status: Home / Self Care | Payer: 59 | Source: Ambulatory Visit | Attending: Endocrinology | Admitting: Endocrinology

## 2020-08-19 DIAGNOSIS — E041 Nontoxic single thyroid nodule: Secondary | ICD-10-CM

## 2020-08-20 ENCOUNTER — Encounter (INDEPENDENT_AMBULATORY_CARE_PROVIDER_SITE_OTHER): Payer: Self-pay | Admitting: Primary Care

## 2020-08-20 ENCOUNTER — Encounter: Payer: Self-pay | Admitting: Orthopedic Surgery

## 2020-08-26 ENCOUNTER — Other Ambulatory Visit: Payer: Self-pay

## 2020-08-26 ENCOUNTER — Emergency Department: Payer: 59

## 2020-08-26 ENCOUNTER — Emergency Department
Admission: EM | Admit: 2020-08-26 | Discharge: 2020-08-26 | Disposition: A | Discharge status: Home / Self Care | Payer: 59 | Attending: Emergency Medicine | Admitting: Emergency Medicine

## 2020-08-26 DIAGNOSIS — E039 Hypothyroidism, unspecified: Secondary | ICD-10-CM | POA: Diagnosis not present

## 2020-08-26 DIAGNOSIS — E1169 Type 2 diabetes mellitus with other specified complication: Secondary | ICD-10-CM | POA: Insufficient documentation

## 2020-08-26 DIAGNOSIS — Z794 Long term (current) use of insulin: Secondary | ICD-10-CM | POA: Diagnosis not present

## 2020-08-26 DIAGNOSIS — Z7982 Long term (current) use of aspirin: Secondary | ICD-10-CM | POA: Insufficient documentation

## 2020-08-26 DIAGNOSIS — J189 Pneumonia, unspecified organism: Secondary | ICD-10-CM | POA: Insufficient documentation

## 2020-08-26 DIAGNOSIS — R059 Cough, unspecified: Secondary | ICD-10-CM | POA: Diagnosis present

## 2020-08-26 DIAGNOSIS — Z8616 Personal history of COVID-19: Secondary | ICD-10-CM | POA: Diagnosis not present

## 2020-08-26 DIAGNOSIS — Z7984 Long term (current) use of oral hypoglycemic drugs: Secondary | ICD-10-CM | POA: Insufficient documentation

## 2020-08-26 DIAGNOSIS — Z79899 Other long term (current) drug therapy: Secondary | ICD-10-CM | POA: Diagnosis not present

## 2020-08-26 DIAGNOSIS — E785 Hyperlipidemia, unspecified: Secondary | ICD-10-CM | POA: Diagnosis not present

## 2020-08-26 LAB — URINALYSIS, COMPLETE (UACMP) WITH MICROSCOPIC
Bilirubin Urine: NEGATIVE
Glucose, UA: >=500 mg/dL — AB
Hgb urine dipstick: NEGATIVE
Ketones, ur: NEGATIVE mg/dL
Nitrite: NEGATIVE
Protein, ur: NEGATIVE mg/dL
Specific Gravity, Urine: 1.023 (ref 1.005–1.030)
pH: 5 (ref 5.0–8.0)

## 2020-08-26 MED ORDER — LEVOFLOXACIN 750 MG PO TABS: 750.0000 mg | ORAL_TABLET | Freq: Every day | ORAL | 0 refills | Status: AC

## 2020-08-26 MED ORDER — ALBUTEROL SULFATE HFA 108 (90 BASE) MCG/ACT IN AERS: 2.0000 | INHALATION_SPRAY | Freq: Four times a day (QID) | RESPIRATORY_TRACT | 2 refills | Status: DC | PRN

## 2020-08-26 MED ORDER — IPRATROPIUM BROMIDE HFA 17 MCG/ACT IN AERS: 1.0000 | INHALATION_SPRAY | Freq: Three times a day (TID) | RESPIRATORY_TRACT | 2 refills | Status: AC

## 2020-08-26 MED ORDER — CEFTRIAXONE SODIUM 1 G IJ SOLR
1.0000 g | Freq: Once | INTRAMUSCULAR | Status: AC
  Administered 2020-08-26: 1 g via INTRAMUSCULAR
  Filled 2020-08-26: qty 10

## 2020-08-26 MED ORDER — SODIUM CHLORIDE 0.9 % IV SOLN
1.0000 g | Freq: Once | INTRAVENOUS | Status: DC
  Filled 2020-08-26: qty 10

## 2020-08-26 MED ORDER — CEFTRIAXONE SODIUM 1 G IJ SOLR: 1.0000 g | Freq: Once | INTRAMUSCULAR | Status: DC

## 2020-08-26 NOTE — ED Provider Notes (Signed)
[facility]-EMERGENCY DEPARTMENT  ____________________________________________  Time seen: Approximately 11:49 PM  I have reviewed the triage vital signs and the nursing notes.   HISTORY  Chief Complaint Cough   Historian Patient    HPI Whitney Thornton is a 55 y.o. female with a history of diabetes, presents to the emergency department with cough for the past week with shortness of breath and wheezing.  Patient reports that she has a history of community-acquired pneumonia and became concerned that Mucinex was not relieving her symptoms.  She denies current chest tightness or chest pain.  She has been afebrile at home.  No nausea, vomiting or diarrhea.  No other alleviating measures have been attempted.   Past Medical History:  Diagnosis Date   Diabetes mellitus without complication (HCC)    Hyperlipidemia    Hypothyroid    Nephritis      Immunizations up to date:  Yes.     Past Medical History:  Diagnosis Date   Diabetes mellitus without complication (HCC)    Hyperlipidemia    Hypothyroid    Nephritis     Patient Active Problem List   Diagnosis Date Noted   Pericardial effusion 02/18/2020   Personal history of COVID-19 02/18/2020   Transaminitis 02/18/2020   Thyroid nodule 02/18/2020   SOB (shortness of breath) 02/18/2020   Hypothyroidism 02/18/2020    Past Surgical History:  Procedure Laterality Date   ABDOMINAL HYSTERECTOMY     CESAREAN SECTION     CHOLECYSTECTOMY     KNEE SURGERY Bilateral    SINUS EXPLORATION     sinus surgery    Prior to Admission medications   Medication Sig Start Date End Date Taking? Authorizing Provider  albuterol (VENTOLIN HFA) 108 (90 Base) MCG/ACT inhaler Inhale 2 puffs into the lungs every 6 (six) hours as needed for wheezing or shortness of breath. 08/26/20  Yes Pia Mau M, PA-C  ipratropium (ATROVENT HFA) 17 MCG/ACT inhaler Inhale 1 puff into the lungs 3 (three) times daily. 08/26/20 08/26/21 Yes Pia Mau  M, PA-C  levofloxacin (LEVAQUIN) 750 MG tablet Take 1 tablet (750 mg total) by mouth daily for 5 days. 08/26/20 08/31/20 Yes Orvil Feil, PA-C  aspirin EC 81 MG tablet Take 81 mg by mouth at bedtime.    [provider]  cyclobenzaprine (FLEXERIL) 10 MG tablet Take 1 tablet (10 mg total) by mouth 3 (three) times daily as needed for muscle spasms. 07/23/20   Magnant, Joycie Peek, PA-C  dextromethorphan-guaiFENesin (MUCINEX DM) 30-600 MG 12hr tablet Take 1 tablet by mouth 2 (two) times daily. 08/08/20   Grayce Sessions, NP  diphenhydramine-acetaminophen (TYLENOL PM) 25-500 MG TABS tablet Take 1 tablet by mouth at bedtime as needed (sleep/pain).    [provider]  ezetimibe (ZETIA) 10 MG tablet Take 1 tablet (10 mg total) by mouth daily. 08/13/20   Grayce Sessions, NP  furosemide (LASIX) 40 MG tablet Take 1 tablet (40 mg total) by mouth daily as needed for fluid or edema. 02/20/20 02/19/21  Leatha Gilding, MD  glipiZIDE (GLUCOTROL XL) 10 MG 24 hr tablet Take 1 tablet (10 mg total) by mouth 2 (two) times daily after a meal. 08/13/20   Romero Belling, MD  glucose blood (ONETOUCH VERIO) test strip 1 each by Other route daily. And lancets 1/day 08/13/20   Romero Belling, MD  insulin glargine (LANTUS SOLOSTAR) 100 UNIT/ML Solostar Pen Inject 20 Units into the skin every morning. And pen needles 1/day 08/13/20   Romero Belling,  MD  ketorolac (TORADOL) 10 MG tablet Take 1 tablet (10 mg total) by mouth every 6 (six) hours as needed. 05/31/20   Triplett, Rulon Eisenmenger B, FNP  levothyroxine (SYNTHROID) 100 MCG tablet Take 1 tablet (100 mcg total) by mouth daily. 08/13/20   Romero Belling, MD  metFORMIN (GLUCOPHAGE-XR) 500 MG 24 hr tablet TAKE 2 TABLETS TWICE A DAY WITH A MEAL 08/13/20   Romero Belling, MD  polyethylene glycol powder (GLYCOLAX/MIRALAX) 17 GM/SCOOP powder Take 17 g by mouth daily as needed for moderate constipation. 06/15/20   Minna Antis, MD  potassium chloride SA (KLOR-CON) 20 MEQ tablet  Take 1 tablet (20 mEq total) by mouth daily as needed (when you take Lasix). 02/20/20   Leatha Gilding, MD  simvastatin (ZOCOR) 80 MG tablet Take 1 tablet (80 mg total) by mouth at bedtime. 08/13/20   Grayce Sessions, NP  VALIUM 10 MG tablet Take 10 mg by mouth at bedtime. 02/11/20   [provider]    Allergies Celebrex [celecoxib]  Family History  Problem Relation Age of Onset   Hypothyroidism Mother    Diabetes Mother    Heart disease Father    Heart disease Brother     Social History Social History   Tobacco Use   Smoking status: Never   Smokeless tobacco: Never  Substance Use Topics   Alcohol use: Yes    Comment: Rare   Drug use: Not Currently     Review of Systems  Constitutional: No fever/chills Eyes:  No discharge ENT: No upper respiratory complaints. Respiratory: Patient has cough. Gastrointestinal:   No nausea, no vomiting.  No diarrhea.  No constipation. Musculoskeletal: Negative for musculoskeletal pain. Skin: Negative for rash, abrasions, lacerations, ecchymosis.   ____________________________________________   PHYSICAL EXAM:  VITAL SIGNS: ED Triage Vitals  Enc Vitals Group     BP 08/26/20 1929 138/82     Pulse Rate 08/26/20 1929 79     Resp 08/26/20 1929 18     Temp 08/26/20 1929 98.5 F (36.9 C)     Temp Source 08/26/20 1929 Oral     SpO2 08/26/20 1929 97 %     Weight 08/26/20 1922 185 lb (83.9 kg)     Height 08/26/20 1922 5\' 6"  (1.676 m)     Head Circumference --      Peak Flow --      Pain Score 08/26/20 1921 7     Pain Loc --      Pain Edu? --      Excl. in GC? --      Constitutional: Alert and oriented. Well appearing and in no acute distress. Eyes: Conjunctivae are normal. PERRL. EOMI. Head: Atraumatic. ENT:      Nose: No congestion/rhinnorhea.      Mouth/Throat: Mucous membranes are moist.  Neck: No stridor.  No cervical spine tenderness to palpation. Cardiovascular: Normal rate, regular rhythm. Normal S1 and S2.   Good peripheral circulation. Respiratory: Normal respiratory effort without tachypnea or retractions.  Patient has expiratory wheezing auscultated bilaterally. good air entry to the bases with no decreased or absent breath sounds Gastrointestinal: Bowel sounds x 4 quadrants. Soft and nontender to palpation. No guarding or rigidity. No distention. Musculoskeletal: Full range of motion to all extremities. No obvious deformities noted Neurologic:  Normal for age. No gross focal neurologic deficits are appreciated.  Skin:  Skin is warm, dry and intact. No rash noted. Psychiatric: Mood and affect are normal for age. Speech and behavior are normal.  ____________________________________________   LABS (all labs ordered are listed, but only abnormal results are displayed)  Labs Reviewed  URINALYSIS, COMPLETE (UACMP) WITH MICROSCOPIC - Abnormal; Notable for the following components:      Result Value   Color, Urine YELLOW (*)    APPearance HAZY (*)    Glucose, UA >=500 (*)    Leukocytes,Ua SMALL (*)    Bacteria, UA RARE (*)    All other components within normal limits  URINE CULTURE   ____________________________________________  EKG   ____________________________________________  RADIOLOGY Geraldo Pitter, personally viewed and evaluated these images (plain radiographs) as part of my medical decision making, as well as reviewing the written report by the radiologist.  DG Chest 2 View  Result Date: 08/26/2020 CLINICAL DATA:  Cough EXAM: CHEST - 2 VIEW COMPARISON:  05/31/2020 FINDINGS: Streaky atelectasis in the lower lungs. No consolidation or effusion. Normal heart size. No pneumothorax. IMPRESSION: Streaky atelectasis at the bases Electronically Signed   By: Jasmine Pang M.D.   On: 08/26/2020 22:21    ____________________________________________    PROCEDURES  Procedure(s) performed:     Procedures     Medications  cefTRIAXone (ROCEPHIN) injection 1 g (1 g  Intramuscular Given 08/26/20 2310)     ____________________________________________   INITIAL IMPRESSION / ASSESSMENT AND PLAN / ED COURSE  Pertinent labs & imaging results that were available during my care of the patient were reviewed by me and considered in my medical decision making (see chart for details).      Assessment and plan Community-acquired pneumonia Dysuria 55 year old female presents to the emergency department with persistent cough for the past 7 days with shortness of breath.  Vital signs were reassuring at triage.  On physical exam, patient was alert, active and nontoxic-appearing.  Patient did have expiratory wheezing auscultated bilaterally.  Patient had some streaky atelectasis at the lung bases and concern for early community-acquired pneumonia.  Will treat patient with Levaquin once daily for the next 5 days.  After initial evaluation, patient also endorsed dysuria and stated that she was prone to pyelonephritis.  Explained to patient that Levaquin would cover her for both pneumonia and pyelonephritis.  She received an injection of ceftriaxone before being discharged.  Urine culture is in process at this time.  Return precautions were given to return with new or worsening symptoms.  All patient questions were answered.     ____________________________________________  FINAL CLINICAL IMPRESSION(S) / ED DIAGNOSES  Final diagnoses:  Community acquired pneumonia, unspecified laterality      NEW MEDICATIONS STARTED DURING THIS VISIT:  ED Discharge Orders          Ordered    levofloxacin (LEVAQUIN) 750 MG tablet  Daily        08/26/20 2308    albuterol (VENTOLIN HFA) 108 (90 Base) MCG/ACT inhaler  Every 6 hours PRN        08/26/20 2309    ipratropium (ATROVENT HFA) 17 MCG/ACT inhaler  3 times daily        08/26/20 2309                This chart was dictated using voice recognition software/Dragon. Despite best efforts to proofread, errors  can occur which can change the meaning. Any change was purely unintentional.     Gasper Lloyd 08/26/20 2352    Sharman Cheek, MD 08/27/20 1929

## 2020-08-26 NOTE — ED Triage Notes (Signed)
Pt was seen at her PCP Tuesday for cough/SHOB and was given mucinex with no relief.

## 2020-08-26 NOTE — Discharge Instructions (Addendum)
Take Levaquin once daily for the next five days.  You can use 1 puff of Atrovent 3 times daily. You can use 4 puffs of albuterol every 4 hours.

## 2020-08-28 ENCOUNTER — Other Ambulatory Visit (INDEPENDENT_AMBULATORY_CARE_PROVIDER_SITE_OTHER): Payer: Self-pay | Admitting: Primary Care

## 2020-08-29 LAB — URINE CULTURE: Culture: 40000 — AB

## 2020-08-30 NOTE — Progress Notes (Signed)
  ED culture report  Urine culture E. Coli  Prescribed levofloxacin 750 mg once daily for 5 day duration to cover CAP and possible UTI.   Discussed with MD. No changes to antibiotics.    Jaynie Bream, PharmD Pharmacy Resident  08/30/2020 2:20 PM

## 2020-09-02 ENCOUNTER — Other Ambulatory Visit: Payer: Self-pay

## 2020-09-02 DIAGNOSIS — E119 Type 2 diabetes mellitus without complications: Secondary | ICD-10-CM

## 2020-09-02 DIAGNOSIS — E039 Hypothyroidism, unspecified: Secondary | ICD-10-CM

## 2020-09-02 MED ORDER — METFORMIN HCL ER 500 MG PO TB24: ORAL_TABLET | ORAL | 3 refills | Status: DC

## 2020-09-02 MED ORDER — CONTOUR NEXT TEST VI STRP: ORAL_STRIP | 12 refills | Status: AC

## 2020-09-02 MED ORDER — LEVOTHYROXINE SODIUM 100 MCG PO TABS: 100.0000 ug | ORAL_TABLET | Freq: Every day | ORAL | 1 refills | Status: DC

## 2020-09-05 ENCOUNTER — Other Ambulatory Visit: Payer: Self-pay | Admitting: Surgical

## 2020-09-05 MED ORDER — CYCLOBENZAPRINE HCL 10 MG PO TABS: 10.0000 mg | ORAL_TABLET | Freq: Three times a day (TID) | ORAL | 3 refills | Status: DC | PRN

## 2020-09-08 ENCOUNTER — Other Ambulatory Visit (INDEPENDENT_AMBULATORY_CARE_PROVIDER_SITE_OTHER): Payer: Self-pay | Admitting: Primary Care

## 2020-09-14 ENCOUNTER — Emergency Department: Payer: 59

## 2020-09-14 ENCOUNTER — Other Ambulatory Visit: Payer: Self-pay

## 2020-09-14 ENCOUNTER — Encounter: Payer: Self-pay | Admitting: Emergency Medicine

## 2020-09-14 ENCOUNTER — Emergency Department
Admission: EM | Admit: 2020-09-14 | Discharge: 2020-09-14 | Disposition: A | Discharge status: Home / Self Care | Payer: 59 | Attending: Emergency Medicine | Admitting: Emergency Medicine

## 2020-09-14 DIAGNOSIS — Z7982 Long term (current) use of aspirin: Secondary | ICD-10-CM | POA: Insufficient documentation

## 2020-09-14 DIAGNOSIS — R059 Cough, unspecified: Secondary | ICD-10-CM | POA: Diagnosis not present

## 2020-09-14 DIAGNOSIS — E039 Hypothyroidism, unspecified: Secondary | ICD-10-CM | POA: Diagnosis not present

## 2020-09-14 DIAGNOSIS — Z7984 Long term (current) use of oral hypoglycemic drugs: Secondary | ICD-10-CM | POA: Diagnosis not present

## 2020-09-14 DIAGNOSIS — Z79899 Other long term (current) drug therapy: Secondary | ICD-10-CM | POA: Diagnosis not present

## 2020-09-14 DIAGNOSIS — Z8616 Personal history of COVID-19: Secondary | ICD-10-CM | POA: Insufficient documentation

## 2020-09-14 DIAGNOSIS — Z794 Long term (current) use of insulin: Secondary | ICD-10-CM | POA: Insufficient documentation

## 2020-09-14 DIAGNOSIS — E119 Type 2 diabetes mellitus without complications: Secondary | ICD-10-CM | POA: Diagnosis not present

## 2020-09-14 LAB — URINALYSIS, ROUTINE W REFLEX MICROSCOPIC
Bilirubin Urine: NEGATIVE
Glucose, UA: >=500 mg/dL — AB
Hgb urine dipstick: NEGATIVE
Ketones, ur: NEGATIVE mg/dL
Leukocytes,Ua: NEGATIVE
Nitrite: NEGATIVE
Protein, ur: NEGATIVE mg/dL
Specific Gravity, Urine: 1.028 (ref 1.005–1.030)
pH: 5 (ref 5.0–8.0)

## 2020-09-14 MED ORDER — HYDROCOD POLST-CPM POLST ER 10-8 MG/5ML PO SUER: 5.0000 mL | Freq: Every evening | ORAL | 0 refills | Status: DC | PRN

## 2020-09-14 NOTE — ED Provider Notes (Signed)
[facility] Emergency Department Provider Note   ____________________________________________   Event Date/Time   First MD Initiated Contact with Patient 09/14/20 [phone]     (approximate)  I have reviewed the triage vital signs and the nursing notes.   HISTORY  Chief Complaint Cough    HPI Whitney Thornton is a 55 y.o. female with past medical history of hyperlipidemia, diabetes, and hypothyroidism who presents to the ED complaining of cough.  Patient reports that she has been dealing with a frequent cough for about the past 2 weeks.  She states the cough is dry and she has not been bringing anything up although the frequent coughing is causing soreness in her chest along with occasional difficulty catching her breath.  She has not had any fevers and denies any nausea, vomiting, diarrhea, or dysuria.        Past Medical History:  Diagnosis Date   Diabetes mellitus without complication (HCC)    Hyperlipidemia    Hypothyroid    Nephritis     Patient Active Problem List   Diagnosis Date Noted   Pericardial effusion 02/18/2020   Personal history of COVID-19 02/18/2020   Transaminitis 02/18/2020   Thyroid nodule 02/18/2020   SOB (shortness of breath) 02/18/2020   Hypothyroidism 02/18/2020    Past Surgical History:  Procedure Laterality Date   ABDOMINAL HYSTERECTOMY     CESAREAN SECTION     CHOLECYSTECTOMY     KNEE SURGERY Bilateral    SINUS EXPLORATION     sinus surgery    Prior to Admission medications   Medication Sig Start Date End Date Taking? Authorizing Provider  chlorpheniramine-HYDROcodone (TUSSIONEX PENNKINETIC ER) 10-8 MG/5ML SUER Take 5 mLs by mouth at bedtime as needed for cough. 09/14/20  Yes Chesley Noon, MD  albuterol (VENTOLIN HFA) 108 (90 Base) MCG/ACT inhaler Inhale 2 puffs into the lungs every 6 (six) hours as needed for wheezing or shortness of breath. 08/26/20   Orvil Feil, PA-C  aspirin EC 81 MG tablet  Take 81 mg by mouth at bedtime.    [provider]  cyclobenzaprine (FLEXERIL) 10 MG tablet Take 1 tablet (10 mg total) by mouth 3 (three) times daily as needed for muscle spasms. 09/05/20   Magnant, Joycie Peek, PA-C  dextromethorphan-guaiFENesin (MUCINEX DM) 30-600 MG 12hr tablet Take 1 tablet by mouth 2 (two) times daily. 08/08/20   Grayce Sessions, NP  diphenhydramine-acetaminophen (TYLENOL PM) 25-500 MG TABS tablet Take 1 tablet by mouth at bedtime as needed (sleep/pain).    [provider]  ezetimibe (ZETIA) 10 MG tablet Take 1 tablet (10 mg total) by mouth daily. 08/13/20   Grayce Sessions, NP  furosemide (LASIX) 40 MG tablet Take 1 tablet (40 mg total) by mouth daily as needed for fluid or edema. 02/20/20 02/19/21  Leatha Gilding, MD  glipiZIDE (GLUCOTROL XL) 10 MG 24 hr tablet Take 1 tablet (10 mg total) by mouth 2 (two) times daily after a meal. 08/13/20   Romero Belling, MD  glucose blood (CONTOUR NEXT TEST) test strip Use to check BS 2x a day 09/02/20   Romero Belling, MD  insulin glargine (LANTUS SOLOSTAR) 100 UNIT/ML Solostar Pen Inject 20 Units into the skin every morning. And pen needles 1/day 08/13/20   Romero Belling, MD  ipratropium (ATROVENT HFA) 17 MCG/ACT inhaler Inhale 1 puff into the lungs 3 (three) times daily. 08/26/20 08/26/21  Orvil Feil, PA-C  ketorolac (TORADOL) 10 MG tablet Take 1 tablet (  10 mg total) by mouth every 6 (six) hours as needed. 05/31/20   Triplett, Rulon Eisenmenger B, FNP  levothyroxine (SYNTHROID) 100 MCG tablet Take 1 tablet (100 mcg total) by mouth daily. 09/02/20   Romero Belling, MD  metFORMIN (GLUCOPHAGE-XR) 500 MG 24 hr tablet TAKE 2 TABLETS TWICE A DAY WITH A MEAL 09/02/20   Romero Belling, MD  polyethylene glycol powder (GLYCOLAX/MIRALAX) 17 GM/SCOOP powder Take 17 g by mouth daily as needed for moderate constipation. 06/15/20   Minna Antis, MD  potassium chloride SA (KLOR-CON) 20 MEQ tablet Take 1 tablet (20 mEq total) by mouth daily as needed  (when you take Lasix). 02/20/20   Leatha Gilding, MD  simvastatin (ZOCOR) 80 MG tablet Take 1 tablet (80 mg total) by mouth at bedtime. 08/13/20   Grayce Sessions, NP  VALIUM 10 MG tablet Take 10 mg by mouth at bedtime. 02/11/20   [provider]    Allergies Celebrex [celecoxib]  Family History  Problem Relation Age of Onset   Hypothyroidism Mother    Diabetes Mother    Heart disease Father    Heart disease Brother     Social History Social History   Tobacco Use   Smoking status: Never   Smokeless tobacco: Never  Substance Use Topics   Alcohol use: Yes    Comment: Rare   Drug use: Not Currently    Review of Systems  Constitutional: No fever/chills Eyes: No visual changes. ENT: No sore throat. Cardiovascular: Denies chest pain. Respiratory: Denies shortness of breath.  Positive for cough. Gastrointestinal: No abdominal pain.  No nausea, no vomiting.  No diarrhea.  No constipation. Genitourinary: Negative for dysuria. Musculoskeletal: Negative for back pain. Skin: Negative for rash. Neurological: Negative for headaches, focal weakness or numbness.  ____________________________________________   PHYSICAL EXAM:  VITAL SIGNS: ED Triage Vitals  Enc Vitals Group     BP 09/14/20 0337 (!) 141/72     Pulse Rate 09/14/20 0337 84     Resp 09/14/20 0337 18     Temp 09/14/20 0337 98 F (36.7 C)     Temp Source 09/14/20 0337 Oral     SpO2 09/14/20 0337 96 %     Weight 09/14/20 0334 184 lb 15.5 oz (83.9 kg)     Height 09/14/20 0334 5\' 6"  (1.676 m)     Head Circumference --      Peak Flow --      Pain Score 09/14/20 0333 9     Pain Loc --      Pain Edu? --      Excl. in GC? --     Constitutional: Alert and oriented. Eyes: Conjunctivae are normal. Head: Atraumatic. Nose: No congestion/rhinnorhea. Mouth/Throat: Mucous membranes are moist. Neck: Normal ROM Cardiovascular: Normal rate, regular rhythm. Grossly normal heart sounds.  2+ radial pulses  bilaterally. Respiratory: Normal respiratory effort.  No retractions. Lungs CTAB. Gastrointestinal: Soft and nontender. No distention. Genitourinary: deferred Musculoskeletal: No lower extremity tenderness nor edema. Neurologic:  Normal speech and language. No gross focal neurologic deficits are appreciated. Skin:  Skin is warm, dry and intact. No rash noted. Psychiatric: Mood and affect are normal. Speech and behavior are normal.  ____________________________________________   LABS (all labs ordered are listed, but only abnormal results are displayed)  Labs Reviewed  URINALYSIS, ROUTINE W REFLEX MICROSCOPIC - Abnormal; Notable for the following components:      Result Value   Color, Urine YELLOW (*)    APPearance CLEAR (*)  Glucose, UA >=500 (*)    Bacteria, UA FEW (*)    All other components within normal limits    PROCEDURES  Procedure(s) performed (including Critical Care):  Procedures   ____________________________________________   INITIAL IMPRESSION / ASSESSMENT AND PLAN / ED COURSE      55 year old female with past medical history of hyperlipidemia, diabetes, and hypothyroidism who presents to the ED complaining of cough ongoing for the past 2 weeks.  Patient is not in any respiratory distress and is maintaining O2 sats on room air.  Chest x-ray reviewed by me and shows no infiltrate, edema, or effusion.  Patient was also concerned about ongoing urinary tract infection although UA is clear.  Patient has been using inhaler without relief of her cough, no wheezing noted on exam.  She was counseled to discuss cough with her PCP as it could be related to her statin.  Otherwise, she is appropriate for discharge home with prescription for Tussionex.  She was counseled to return to the ED for new or worsening symptoms, patient agrees with plan.      ____________________________________________   FINAL CLINICAL IMPRESSION(S) / ED DIAGNOSES  Final diagnoses:  Cough      ED Discharge Orders          Ordered    chlorpheniramine-HYDROcodone (TUSSIONEX PENNKINETIC ER) 10-8 MG/5ML SUER  At bedtime PRN        09/14/20 0518             Note:  This document was prepared using Dragon voice recognition software and may include unintentional dictation errors.    Chesley Noon, MD 09/14/20 [phone]

## 2020-09-14 NOTE — ED Triage Notes (Signed)
Pt to ED via POV with c/o continued cough x several weeks. Pt states currently has a cough drop in her mouth and has suppressed the cough. Pt A&O x4, ambulatory without difficulty. NAD noted on arrival to ED, able to speak in full and complete sentences without difficulty. Pt also states possible continued UTI.

## 2020-09-18 ENCOUNTER — Other Ambulatory Visit: Payer: Self-pay

## 2020-09-18 DIAGNOSIS — E039 Hypothyroidism, unspecified: Secondary | ICD-10-CM

## 2020-09-18 DIAGNOSIS — E119 Type 2 diabetes mellitus without complications: Secondary | ICD-10-CM

## 2020-09-18 MED ORDER — LEVOTHYROXINE SODIUM 100 MCG PO TABS: 100.0000 ug | ORAL_TABLET | Freq: Every day | ORAL | 1 refills | Status: DC

## 2020-09-18 MED ORDER — METFORMIN HCL ER 500 MG PO TB24
ORAL_TABLET | ORAL | 3 refills | Status: DC
Start: 2020-09-18 — End: 2021-06-02

## 2020-09-30 ENCOUNTER — Ambulatory Visit: Payer: 59 | Admitting: Endocrinology

## 2020-10-07 ENCOUNTER — Other Ambulatory Visit (INDEPENDENT_AMBULATORY_CARE_PROVIDER_SITE_OTHER): Payer: Self-pay | Admitting: Primary Care

## 2020-10-07 NOTE — Telephone Encounter (Signed)
Sent to PCP to refill if appropriate.  

## 2020-10-31 ENCOUNTER — Other Ambulatory Visit (INDEPENDENT_AMBULATORY_CARE_PROVIDER_SITE_OTHER): Payer: Self-pay | Admitting: Primary Care

## 2020-10-31 DIAGNOSIS — J9801 Acute bronchospasm: Secondary | ICD-10-CM

## 2020-10-31 NOTE — Telephone Encounter (Signed)
Sent to PCP ?

## 2020-11-05 ENCOUNTER — Encounter: Payer: Self-pay | Admitting: Orthopedic Surgery

## 2020-11-07 ENCOUNTER — Other Ambulatory Visit (INDEPENDENT_AMBULATORY_CARE_PROVIDER_SITE_OTHER): Payer: Self-pay | Admitting: Primary Care

## 2020-11-07 ENCOUNTER — Other Ambulatory Visit: Payer: Self-pay

## 2020-11-07 ENCOUNTER — Encounter: Payer: Self-pay | Admitting: Surgical

## 2020-11-07 ENCOUNTER — Ambulatory Visit: Payer: Self-pay

## 2020-11-07 ENCOUNTER — Ambulatory Visit (INDEPENDENT_AMBULATORY_CARE_PROVIDER_SITE_OTHER): Payer: 59 | Admitting: Surgical

## 2020-11-07 DIAGNOSIS — M25562 Pain in left knee: Secondary | ICD-10-CM

## 2020-11-07 NOTE — Progress Notes (Signed)
Office Visit Note   Patient: Whitney Thornton           Date of Birth: [birth year]           MRN: [id number] Visit Date: 11/07/2020 Requested by: Grayce Sessions, NP [address] [place],  [place] [address] PCP: Grayce Sessions, NP  Subjective: Chief Complaint  Patient presents with   Left Lower Leg - Follow-up    HPI: Whitney Thornton is a 55 y.o. female who presents to the office complaining of left knee pain.  Patient states that she tripped on a carpet and landed on her left knee laterally when she felt immediate pain and swelling.  She had pain that radiates down the lateral aspect of her lower leg to about the mid calf.  She describes pain as a sharp aching and throbbing sensation.  She taking Mobic for pain control.  Injury was about a week ago.  She has had 2 previous knee surgeries to this knee.  Denies any groin pain or current radicular pain.  She has noticed some bruising that has extended down even into the dorsum of the left foot.  She has not noticed any mechanical symptoms or motor dysfunction of the left leg..                ROS: All systems reviewed are negative as they relate to the chief complaint within the history of present illness.  Patient denies fevers or chills.  Assessment & Plan: Visit Diagnoses:  1. Left knee pain, unspecified chronicity     Plan: Patient is a 55 year old female who presents following fall about a week ago.  She tripped on a carpet and fell onto her lateral left knee.  Most of her tenderness on exam is over the proximal fibula and fibular head.  She does have some ecchymosis that is visible around the lateral calf as well as the dorsum of the left foot.  There is a subtle lucency noted through the fibular neck that is concerning for nondisplaced fracture of the left fibula.  She has slight antalgic gait but otherwise seems to be doing okay.  With her exam findings and radiographs, suspect that she does have a  nondisplaced fibular fracture.  Does not seem to be any involvement of the peroneal nerve.  Plan to have her weight-bear as tolerated on the injured extremity.  Follow-up in 4 weeks for clinical recheck but encouraged her to make an appoint with the office to follow-up sooner if she has no significant progressive improvement of her pain over the next 10 to 14 days.  Patient agreed with plan.  If no improvement, consider MRI scan of the left knee for further evaluation.  Follow-Up Instructions: No follow-ups on file.   Orders:  Orders Placed This Encounter  Procedures   XR KNEE 3 VIEW LEFT   No orders of the defined types were placed in this encounter.     Procedures: No procedures performed   Clinical Data: No additional findings.  Objective: Vital Signs: There were no vitals taken for this visit.  Physical Exam:  Constitutional: Patient appears well-developed HEENT:  Head: Normocephalic Eyes:EOM are normal Neck: Normal range of motion Cardiovascular: Normal rate Pulmonary/chest: Effort normal Neurologic: Patient is alert Skin: Skin is warm Psychiatric: Patient has normal mood and affect  Ortho Exam: Ortho exam demonstrates left knee without effusion.  Mild tenderness over the medial lateral joint lines of the left knee.  Well-healed incisions from prior  surgeries.  Able to perform straight leg raise without difficulty.  Tenderness over the fibular head and neck severely with more mild to moderate pain through the proximal fibular shaft below.  No tenderness over the medial or lateral malleoli.  No pain with syndesmotic squeeze or instability to syndesmotic testing of the left ankle.  She is able to actively dorsiflex and plantarflex ankle without difficulty.  She has full sensation over the dorsum of the left foot.  No pain with hip range of motion.  No extensor lag with straight leg raise.  No tenderness over the quad tendon, patellar tendon, patella, tibial tubercle.  No laxity  to varus/valgus stress of the left knee at 0 or 30 degrees.  No increased laxity of anterior/posterior drawer.  Specialty Comments:  No specialty comments available.  Imaging: No results found.   PMFS History: Patient Active Problem List   Diagnosis Date Noted   Pericardial effusion 02/18/2020   Personal history of COVID-19 02/18/2020   Transaminitis 02/18/2020   Thyroid nodule 02/18/2020   SOB (shortness of breath) 02/18/2020   Hypothyroidism 02/18/2020   Past Medical History:  Diagnosis Date   Diabetes mellitus without complication (HCC)    Hyperlipidemia    Hypothyroid    Nephritis     Family History  Problem Relation Age of Onset   Hypothyroidism Mother    Diabetes Mother    Heart disease Father    Heart disease Brother     Past Surgical History:  Procedure Laterality Date   ABDOMINAL HYSTERECTOMY     CESAREAN SECTION     CHOLECYSTECTOMY     KNEE SURGERY Bilateral    SINUS EXPLORATION     sinus surgery   Social History   Occupational History   Not on file  Tobacco Use   Smoking status: Never   Smokeless tobacco: Never  Substance and Sexual Activity   Alcohol use: Yes    Comment: Rare   Drug use: Not Currently   Sexual activity: Not on file

## 2020-11-10 ENCOUNTER — Other Ambulatory Visit: Payer: Self-pay | Admitting: Surgical

## 2020-11-10 MED ORDER — TRAMADOL HCL 50 MG PO TABS: 50.0000 mg | ORAL_TABLET | Freq: Two times a day (BID) | ORAL | 0 refills | Status: DC | PRN

## 2020-11-14 ENCOUNTER — Other Ambulatory Visit: Payer: Self-pay | Admitting: Surgical

## 2020-11-14 MED ORDER — HYDROCODONE-ACETAMINOPHEN 5-325 MG PO TABS: 1.0000 | ORAL_TABLET | Freq: Two times a day (BID) | ORAL | 0 refills | Status: DC | PRN

## 2020-11-30 ENCOUNTER — Other Ambulatory Visit: Payer: Self-pay | Admitting: Endocrinology

## 2020-11-30 DIAGNOSIS — E119 Type 2 diabetes mellitus without complications: Secondary | ICD-10-CM

## 2020-12-01 ENCOUNTER — Emergency Department
Admission: EM | Admit: 2020-12-01 | Discharge: 2020-12-01 | Disposition: A | Discharge status: Home / Self Care | Payer: 59 | Attending: Student in an Organized Health Care Education/Training Program | Admitting: Student in an Organized Health Care Education/Training Program

## 2020-12-01 ENCOUNTER — Other Ambulatory Visit: Payer: Self-pay

## 2020-12-01 ENCOUNTER — Emergency Department: Payer: 59

## 2020-12-01 DIAGNOSIS — N3000 Acute cystitis without hematuria: Secondary | ICD-10-CM | POA: Insufficient documentation

## 2020-12-01 DIAGNOSIS — Z79899 Other long term (current) drug therapy: Secondary | ICD-10-CM | POA: Insufficient documentation

## 2020-12-01 DIAGNOSIS — R42 Dizziness and giddiness: Secondary | ICD-10-CM | POA: Diagnosis not present

## 2020-12-01 DIAGNOSIS — Z7984 Long term (current) use of oral hypoglycemic drugs: Secondary | ICD-10-CM | POA: Insufficient documentation

## 2020-12-01 DIAGNOSIS — R109 Unspecified abdominal pain: Secondary | ICD-10-CM | POA: Diagnosis present

## 2020-12-01 DIAGNOSIS — E039 Hypothyroidism, unspecified: Secondary | ICD-10-CM | POA: Diagnosis not present

## 2020-12-01 DIAGNOSIS — Z794 Long term (current) use of insulin: Secondary | ICD-10-CM | POA: Diagnosis not present

## 2020-12-01 DIAGNOSIS — Z8616 Personal history of COVID-19: Secondary | ICD-10-CM | POA: Insufficient documentation

## 2020-12-01 DIAGNOSIS — E119 Type 2 diabetes mellitus without complications: Secondary | ICD-10-CM | POA: Insufficient documentation

## 2020-12-01 DIAGNOSIS — Z7982 Long term (current) use of aspirin: Secondary | ICD-10-CM | POA: Insufficient documentation

## 2020-12-01 DIAGNOSIS — I10 Essential (primary) hypertension: Secondary | ICD-10-CM | POA: Insufficient documentation

## 2020-12-01 LAB — URINALYSIS, ROUTINE W REFLEX MICROSCOPIC
Bilirubin Urine: NEGATIVE
Glucose, UA: 150 mg/dL — AB
Hgb urine dipstick: NEGATIVE
Ketones, ur: NEGATIVE mg/dL
Nitrite: NEGATIVE
Protein, ur: NEGATIVE mg/dL
Specific Gravity, Urine: 1.026 (ref 1.005–1.030)
WBC, UA: >50 WBC/hpf — ABNORMAL HIGH (ref 0–5)
pH: 5 (ref 5.0–8.0)

## 2020-12-01 LAB — CBC
HCT: 41.9 % (ref 36.0–46.0)
Hemoglobin: 14.2 g/dL (ref 12.0–15.0)
MCH: 29.4 pg (ref 26.0–34.0)
MCHC: 33.9 g/dL (ref 30.0–36.0)
MCV: 86.7 fL (ref 80.0–100.0)
Platelets: 222 10*3/uL (ref 150–400)
RBC: 4.83 MIL/uL (ref 3.87–5.11)
RDW: 13.6 % (ref 11.5–15.5)
WBC: 5.8 10*3/uL (ref 4.0–10.5)
nRBC: 0 % (ref 0.0–0.2)

## 2020-12-01 LAB — LIPASE, BLOOD: Lipase: 35 U/L (ref 11–51)

## 2020-12-01 LAB — COMPREHENSIVE METABOLIC PANEL
ALT: 48 U/L — ABNORMAL HIGH (ref 0–44)
AST: 48 U/L — ABNORMAL HIGH (ref 15–41)
Albumin: 4.3 g/dL (ref 3.5–5.0)
Alkaline Phosphatase: 97 U/L (ref 38–126)
Anion gap: 7 (ref 5–15)
BUN: 14 mg/dL (ref 6–20)
CO2: 24 mmol/L (ref 22–32)
Calcium: 9.5 mg/dL (ref 8.9–10.3)
Chloride: 105 mmol/L (ref 98–111)
Creatinine, Ser: 0.74 mg/dL (ref 0.44–1.00)
GFR, Estimated: >60 mL/min (ref >60)
Glucose, Bld: 211 mg/dL — ABNORMAL HIGH (ref 70–99)
Potassium: 4 mmol/L (ref 3.5–5.1)
Sodium: 136 mmol/L (ref 135–145)
Total Bilirubin: 1.4 mg/dL — ABNORMAL HIGH (ref 0.3–1.2)
Total Protein: 7.2 g/dL (ref 6.5–8.1)

## 2020-12-01 MED ORDER — SODIUM CHLORIDE 0.9 % IV SOLN
1.0000 g | Freq: Once | INTRAVENOUS | Status: AC
  Administered 2020-12-01: 1 g via INTRAVENOUS
  Filled 2020-12-01: qty 10

## 2020-12-01 MED ORDER — CEFUROXIME AXETIL 250 MG PO TABS: 250.0000 mg | ORAL_TABLET | Freq: Two times a day (BID) | ORAL | 0 refills | Status: AC

## 2020-12-01 MED ORDER — SODIUM CHLORIDE 0.9 % IV BOLUS
1000.0000 mL | Freq: Once | INTRAVENOUS | Status: AC
  Administered 2020-12-01: 1000 mL via INTRAVENOUS

## 2020-12-01 NOTE — ED Triage Notes (Signed)
Pt comes with c/o UTI for awhile and feels it is getting worse. Pt states she has been feeling worse and gets dizzy. Pt states she might be dehydrated.   Pt states she has not been seen for this UTI. Pt states hx of sepsis with UTI in past.

## 2020-12-01 NOTE — ED Provider Notes (Signed)
[facility] Emergency Department Provider Note    Event Date/Time   First MD Initiated Contact with Patient 12/01/20 1408     (approximate)  I have reviewed the triage vital signs and the nursing notes.   HISTORY  Chief Complaint Abdominal Pain    HPI Whitney Thornton is a 55 y.o. female below listed past medical history presents to the ER for dysuria flank pain lightheadedness feel like she is dehydrated and dizziness.  States that she has another urinary tract infection.  States that she keeps on getting recurrent infections will treat them but then they come back a few months later.  She not having any vomiting but has had poor p.o. intake just because she feels rundown.  She is denying any chest pain or shortness of breath at this time.  No history of kidney stones.  Past Medical History:  Diagnosis Date   Diabetes mellitus without complication ([place])    Hyperlipidemia    Hypothyroid    Nephritis    Family History  Problem Relation Age of Onset   Hypothyroidism Mother    Diabetes Mother    Heart disease Father    Heart disease Brother    Past Surgical History:  Procedure Laterality Date   ABDOMINAL HYSTERECTOMY     CESAREAN SECTION     CHOLECYSTECTOMY     KNEE SURGERY Bilateral    SINUS EXPLORATION     sinus surgery   Patient Active Problem List   Diagnosis Date Noted   Pericardial effusion 02/18/2020   Personal history of COVID-19 02/18/2020   Transaminitis 02/18/2020   Thyroid nodule 02/18/2020   SOB (shortness of breath) 02/18/2020   Hypothyroidism 02/18/2020      Prior to Admission medications   Medication Sig Start Date End Date Taking? Authorizing Provider  HYDROcodone-acetaminophen (NORCO/VICODIN) 5-325 MG tablet Take 1 tablet by mouth every 12 (twelve) hours as needed for moderate pain. 11/14/20 11/14/21  Magnant, Charles L, PA-C  albuterol (VENTOLIN HFA) 108 (90 Base) MCG/ACT inhaler TAKE 2 PUFFS BY MOUTH EVERY  6 HOURS AS NEEDED FOR WHEEZE OR SHORTNESS OF BREATH 11/06/20   Kerin Perna, NP  aspirin EC 81 MG tablet Take 81 mg by mouth at bedtime.    [provider]  chlorpheniramine-HYDROcodone (TUSSIONEX PENNKINETIC ER) 10-8 MG/5ML SUER Take 5 mLs by mouth at bedtime as needed for cough. 09/14/20   Blake Divine, MD  cyclobenzaprine (FLEXERIL) 10 MG tablet Take 1 tablet (10 mg total) by mouth 3 (three) times daily as needed for muscle spasms. 09/05/20   Magnant, Gerrianne Scale, PA-C  dextromethorphan-guaiFENesin (MUCINEX DM) 30-600 MG 12hr tablet Take 1 tablet by mouth 2 (two) times daily. 08/08/20   Kerin Perna, NP  diphenhydramine-acetaminophen (TYLENOL PM) 25-500 MG TABS tablet Take 1 tablet by mouth at bedtime as needed (sleep/pain).    [provider]  ezetimibe (ZETIA) 10 MG tablet Take 1 tablet (10 mg total) by mouth daily. 08/13/20   Kerin Perna, NP  furosemide (LASIX) 40 MG tablet Take 1 tablet (40 mg total) by mouth daily as needed for fluid or edema. 02/20/20 02/19/21  Caren Griffins, MD  glipiZIDE (GLUCOTROL XL) 10 MG 24 hr tablet Take 1 tablet (10 mg total) by mouth 2 (two) times daily after a meal. 08/13/20   Renato Shin, MD  glucose blood (CONTOUR NEXT TEST) test strip Use to check BS 2x a day 09/02/20   Renato Shin, MD  insulin glargine (LANTUS  SOLOSTAR) 100 UNIT/ML Solostar Pen Inject 20 Units into the skin every morning. And pen needles 1/day 08/13/20   Romero Belling, MD  ipratropium (ATROVENT HFA) 17 MCG/ACT inhaler Inhale 1 puff into the lungs 3 (three) times daily. 08/26/20 08/26/21  Orvil Feil, PA-C  ketorolac (TORADOL) 10 MG tablet Take 1 tablet (10 mg total) by mouth every 6 (six) hours as needed. 05/31/20   Triplett, Rulon Eisenmenger B, FNP  levothyroxine (SYNTHROID) 100 MCG tablet Take 1 tablet (100 mcg total) by mouth daily. 09/18/20   Romero Belling, MD  metFORMIN (GLUCOPHAGE-XR) 500 MG 24 hr tablet TAKE 2 TABLETS TWICE A DAY WITH A MEAL 09/18/20   Romero Belling,  MD  polyethylene glycol powder (GLYCOLAX/MIRALAX) 17 GM/SCOOP powder Take 17 g by mouth daily as needed for moderate constipation. 06/15/20   Minna Antis, MD  potassium chloride SA (KLOR-CON) 20 MEQ tablet Take 1 tablet (20 mEq total) by mouth daily as needed (when you take Lasix). 02/20/20   Leatha Gilding, MD  simvastatin (ZOCOR) 80 MG tablet Take 1 tablet (80 mg total) by mouth at bedtime. 08/13/20   Grayce Sessions, NP  VALIUM 10 MG tablet Take 10 mg by mouth at bedtime. 02/11/20   [provider]    Allergies Celebrex [celecoxib]    Social History Social History   Tobacco Use   Smoking status: Never   Smokeless tobacco: Never  Substance Use Topics   Alcohol use: Yes    Comment: Rare   Drug use: Not Currently    Review of Systems Patient denies headaches, rhinorrhea, blurry vision, numbness, shortness of breath, chest pain, edema, cough, abdominal pain, nausea, vomiting, diarrhea, dysuria, fevers, rashes or hallucinations unless otherwise stated above in HPI. ____________________________________________   PHYSICAL EXAM:  VITAL SIGNS: Vitals:   12/01/20 1232 12/01/20 1545  BP: 133/82 130/78  Pulse: 77 70  Resp: 18 18  Temp: 98.4 F (36.9 C)   SpO2: 96% 96%    Constitutional: Alert and oriented.  Eyes: Conjunctivae are normal.  Head: Atraumatic. Nose: No congestion/rhinnorhea. Mouth/Throat: Mucous membranes are moist.   Neck: No stridor. Painless ROM.  Cardiovascular: Normal rate, regular rhythm. Grossly normal heart sounds.  Good peripheral circulation. Respiratory: Normal respiratory effort.  No retractions. Lungs CTAB. Gastrointestinal: Soft and nontender. No distention. No abdominal bruits. No CVA tenderness. Genitourinary:  Musculoskeletal: No lower extremity tenderness nor edema.  No joint effusions. Neurologic:  Normal speech and language. No gross focal neurologic deficits are appreciated. No facial droop Skin:  Skin is warm, dry and  intact. No rash noted. Psychiatric: Mood and affect are normal. Speech and behavior are normal.  ____________________________________________   LABS (all labs ordered are listed, but only abnormal results are displayed)  Results for orders placed or performed during the hospital encounter of 12/01/20 (from the past 24 hour(s))  Lipase, blood     Status: None   Collection Time: 12/01/20 12:33 PM  Result Value Ref Range   Lipase 35 11 - 51 U/L  Comprehensive metabolic panel     Status: Abnormal   Collection Time: 12/01/20 12:33 PM  Result Value Ref Range   Sodium 136 135 - 145 mmol/L   Potassium 4.0 3.5 - 5.1 mmol/L   Chloride 105 98 - 111 mmol/L   CO2 24 22 - 32 mmol/L   Glucose, Bld 211 (H) 70 - 99 mg/dL   BUN 14 6 - 20 mg/dL   Creatinine, Ser 5.62 0.44 - 1.00 mg/dL   Calcium  9.5 8.9 - 10.3 mg/dL   Total Protein 7.2 6.5 - 8.1 g/dL   Albumin 4.3 3.5 - 5.0 g/dL   AST 48 (H) 15 - 41 U/L   ALT 48 (H) 0 - 44 U/L   Alkaline Phosphatase 97 38 - 126 U/L   Total Bilirubin 1.4 (H) 0.3 - 1.2 mg/dL   GFR, Estimated >48 >54 mL/min   Anion gap 7 5 - 15  CBC     Status: None   Collection Time: 12/01/20 12:33 PM  Result Value Ref Range   WBC 5.8 4.0 - 10.5 K/uL   RBC 4.83 3.87 - 5.11 MIL/uL   Hemoglobin 14.2 12.0 - 15.0 g/dL   HCT 62.7 03.5 - 00.9 %   MCV 86.7 80.0 - 100.0 fL   MCH 29.4 26.0 - 34.0 pg   MCHC 33.9 30.0 - 36.0 g/dL   RDW 38.1 82.9 - 93.7 %   Platelets 222 150 - 400 K/uL   nRBC 0.0 0.0 - 0.2 %  Urinalysis, Routine w reflex microscopic     Status: Abnormal   Collection Time: 12/01/20 12:50 PM  Result Value Ref Range   Color, Urine AMBER (A) YELLOW   APPearance CLOUDY (A) CLEAR   Specific Gravity, Urine 1.026 1.005 - 1.030   pH 5.0 5.0 - 8.0   Glucose, UA 150 (A) NEGATIVE mg/dL   Hgb urine dipstick NEGATIVE NEGATIVE   Bilirubin Urine NEGATIVE NEGATIVE   Ketones, ur NEGATIVE NEGATIVE mg/dL   Protein, ur NEGATIVE NEGATIVE mg/dL   Nitrite NEGATIVE NEGATIVE    Leukocytes,Ua SMALL (A) NEGATIVE   RBC / HPF 0-5 0 - 5 RBC/hpf   WBC, UA >50 (H) 0 - 5 WBC/hpf   Bacteria, UA MANY (A) NONE SEEN   Squamous Epithelial / LPF 6-10 0 - 5   Mucus PRESENT    ____________________________________________ ____________________________________________  RADIOLOGY  I personally reviewed all radiographic images ordered to evaluate for the above acute complaints and reviewed radiology reports and findings.  These findings were personally discussed with the patient.  Please see medical record for radiology report.  ____________________________________________   PROCEDURES  Procedure(s) performed:  Procedures    Critical Care performed: no ____________________________________________   INITIAL IMPRESSION / ASSESSMENT AND PLAN / ED COURSE  Pertinent labs & imaging results that were available during my care of the patient were reviewed by me and considered in my medical decision making (see chart for details).   DDX: Cystitis, pyelonephritis, dehydration, electrolyte abnormality  Madysun Vendetta Pittinger is a 55 y.o. who presents to the ED with symptoms as described above.  Does have evidence of acute cystitis and her exam and symptoms are consistent with pyelonephritis.  She is not septic will give IV fluids.  Abdominal exam is otherwise soft and benign do not feel that CT imaging clinically indicated does not have any hematuria or history of kidney stones.  Review of past medical record shows that she has frequent E. coli urinary tract infections that are developing some resistance but is still sensitive to Rocephin.  She is hemodynamically stable will place her on oral antibiotics.  Do believe she is appropriate for trial of outpatient management.  We discussed strict return precautions.     The patient was evaluated in Emergency Department today for the symptoms described in the history of present illness. He/she was evaluated in the context of the global  COVID-19 pandemic, which necessitated consideration that the patient might be at risk for infection with the  SARS-CoV-2 virus that causes COVID-19. Institutional protocols and algorithms that pertain to the evaluation of patients at risk for COVID-19 are in a state of rapid change based on information released by regulatory bodies including the CDC and federal and state organizations. These policies and algorithms were followed during the patient's care in the ED.  As part of my medical decision making, I reviewed the following data within the [facility] notes reviewed and incorporated, Labs reviewed, notes from prior ED visits and [place] Controlled Substance Database   ____________________________________________   FINAL CLINICAL IMPRESSION(S) / ED DIAGNOSES  Final diagnoses:  Acute cystitis without hematuria      NEW MEDICATIONS STARTED DURING THIS VISIT:  New Prescriptions   No medications on file     Note:  This document was prepared using Dragon voice recognition software and may include unintentional dictation errors.    Merlyn Lot, MD 12/01/20 [phone]

## 2020-12-14 ENCOUNTER — Encounter: Payer: Self-pay | Admitting: Emergency Medicine

## 2020-12-14 ENCOUNTER — Other Ambulatory Visit: Payer: Self-pay

## 2020-12-14 DIAGNOSIS — N898 Other specified noninflammatory disorders of vagina: Secondary | ICD-10-CM | POA: Diagnosis not present

## 2020-12-14 DIAGNOSIS — Z7982 Long term (current) use of aspirin: Secondary | ICD-10-CM | POA: Diagnosis not present

## 2020-12-14 DIAGNOSIS — E039 Hypothyroidism, unspecified: Secondary | ICD-10-CM | POA: Diagnosis not present

## 2020-12-14 DIAGNOSIS — E119 Type 2 diabetes mellitus without complications: Secondary | ICD-10-CM | POA: Insufficient documentation

## 2020-12-14 DIAGNOSIS — R300 Dysuria: Secondary | ICD-10-CM | POA: Insufficient documentation

## 2020-12-14 DIAGNOSIS — Z8616 Personal history of COVID-19: Secondary | ICD-10-CM | POA: Insufficient documentation

## 2020-12-14 DIAGNOSIS — Z7984 Long term (current) use of oral hypoglycemic drugs: Secondary | ICD-10-CM | POA: Insufficient documentation

## 2020-12-14 DIAGNOSIS — Z79899 Other long term (current) drug therapy: Secondary | ICD-10-CM | POA: Insufficient documentation

## 2020-12-14 DIAGNOSIS — K5901 Slow transit constipation: Secondary | ICD-10-CM | POA: Insufficient documentation

## 2020-12-14 DIAGNOSIS — Z20822 Contact with and (suspected) exposure to covid-19: Secondary | ICD-10-CM | POA: Diagnosis not present

## 2020-12-14 DIAGNOSIS — R0602 Shortness of breath: Secondary | ICD-10-CM | POA: Diagnosis not present

## 2020-12-14 DIAGNOSIS — I10 Essential (primary) hypertension: Secondary | ICD-10-CM | POA: Diagnosis not present

## 2020-12-14 LAB — CBC WITH DIFFERENTIAL/PLATELET
Abs Immature Granulocytes: 0.04 10*3/uL (ref 0.00–0.07)
Basophils Absolute: 0.1 10*3/uL (ref 0.0–0.1)
Basophils Relative: 1 %
Eosinophils Absolute: 0.5 10*3/uL (ref 0.0–0.5)
Eosinophils Relative: 5 %
HCT: 43.3 % (ref 36.0–46.0)
Hemoglobin: 15 g/dL (ref 12.0–15.0)
Immature Granulocytes: 0 %
Lymphocytes Relative: 26 %
Lymphs Abs: 2.5 10*3/uL (ref 0.7–4.0)
MCH: 29.9 pg (ref 26.0–34.0)
MCHC: 34.6 g/dL (ref 30.0–36.0)
MCV: 86.3 fL (ref 80.0–100.0)
Monocytes Absolute: 0.7 10*3/uL (ref 0.1–1.0)
Monocytes Relative: 7 %
Neutro Abs: 5.9 10*3/uL (ref 1.7–7.7)
Neutrophils Relative %: 61 %
Platelets: 288 10*3/uL (ref 150–400)
RBC: 5.02 MIL/uL (ref 3.87–5.11)
RDW: 13.5 % (ref 11.5–15.5)
WBC: 9.7 10*3/uL (ref 4.0–10.5)
nRBC: 0 % (ref 0.0–0.2)

## 2020-12-14 LAB — COMPREHENSIVE METABOLIC PANEL
ALT: 90 U/L — ABNORMAL HIGH (ref 0–44)
AST: 44 U/L — ABNORMAL HIGH (ref 15–41)
Albumin: 4.7 g/dL (ref 3.5–5.0)
Alkaline Phosphatase: 127 U/L — ABNORMAL HIGH (ref 38–126)
Anion gap: 9 (ref 5–15)
BUN: 23 mg/dL — ABNORMAL HIGH (ref 6–20)
CO2: 20 mmol/L — ABNORMAL LOW (ref 22–32)
Calcium: 9.1 mg/dL (ref 8.9–10.3)
Chloride: 102 mmol/L (ref 98–111)
Creatinine, Ser: 0.78 mg/dL (ref 0.44–1.00)
GFR, Estimated: >60 mL/min (ref >60)
Glucose, Bld: 422 mg/dL — ABNORMAL HIGH (ref 70–99)
Potassium: 4.2 mmol/L (ref 3.5–5.1)
Sodium: 131 mmol/L — ABNORMAL LOW (ref 135–145)
Total Bilirubin: 0.8 mg/dL (ref 0.3–1.2)
Total Protein: 7.5 g/dL (ref 6.5–8.1)

## 2020-12-14 LAB — URINALYSIS, ROUTINE W REFLEX MICROSCOPIC
Bilirubin Urine: NEGATIVE
Glucose, UA: 500 mg/dL — AB
Hgb urine dipstick: NEGATIVE
Nitrite: NEGATIVE
Protein, ur: NEGATIVE mg/dL
Specific Gravity, Urine: 1.02 (ref 1.005–1.030)
pH: 6 (ref 5.0–8.0)

## 2020-12-14 LAB — LACTIC ACID, PLASMA
Lactic Acid, Venous: 2.3 mmol/L (ref 0.5–1.9)
Lactic Acid, Venous: 1.7 mmol/L (ref 0.5–1.9)

## 2020-12-14 LAB — URINALYSIS, MICROSCOPIC (REFLEX)

## 2020-12-14 NOTE — ED Triage Notes (Signed)
Pt via POV from home Pt c/o of UTI symptoms. Pt states she was seen 2 weeks ago and they gave her IV fluid and abx with no relief. Also c/o bilateral flank pain.

## 2020-12-15 ENCOUNTER — Emergency Department: Payer: 59

## 2020-12-15 ENCOUNTER — Other Ambulatory Visit: Payer: Self-pay

## 2020-12-15 ENCOUNTER — Emergency Department
Admission: EM | Admit: 2020-12-15 | Discharge: 2020-12-15 | Disposition: A | Discharge status: Home / Self Care | Payer: 59 | Attending: Emergency Medicine | Admitting: Emergency Medicine

## 2020-12-15 DIAGNOSIS — R0602 Shortness of breath: Secondary | ICD-10-CM

## 2020-12-15 DIAGNOSIS — R300 Dysuria: Secondary | ICD-10-CM

## 2020-12-15 DIAGNOSIS — K5901 Slow transit constipation: Secondary | ICD-10-CM

## 2020-12-15 DIAGNOSIS — N898 Other specified noninflammatory disorders of vagina: Secondary | ICD-10-CM

## 2020-12-15 LAB — RESP PANEL BY RT-PCR (FLU A&B, COVID) ARPGX2
Influenza A by PCR: NEGATIVE
Influenza B by PCR: NEGATIVE
SARS Coronavirus 2 by RT PCR: NEGATIVE

## 2020-12-15 LAB — TROPONIN I (HIGH SENSITIVITY): Troponin I (High Sensitivity): 3 ng/L (ref <18)

## 2020-12-15 LAB — CBG MONITORING, ED: Glucose-Capillary: 279 mg/dL — ABNORMAL HIGH (ref 70–99)

## 2020-12-15 MED ORDER — IOHEXOL 350 MG/ML SOLN
75.0000 mL | Freq: Once | INTRAVENOUS | Status: AC | PRN
  Administered 2020-12-15: 05:00:00 75 mL via INTRAVENOUS

## 2020-12-15 MED ORDER — LACTATED RINGERS IV BOLUS
1000.0000 mL | Freq: Once | INTRAVENOUS | Status: AC
  Administered 2020-12-15: 05:00:00 1000 mL via INTRAVENOUS

## 2020-12-15 NOTE — ED Provider Notes (Signed)
[facility] Emergency Department Provider Note  ____________________________________________  Time seen: Approximately 3:56 AM  I have reviewed the triage vital signs and the nursing notes.   HISTORY  Chief Complaint Urinary Tract Infection   HPI Whitney Thornton is a 55 y.o. female with a history of diabetes, hypertension, hyperlipidemia, COVID-19 complicated by pericardial effusion, hypothyroidism, chronic shortness of breath who presents to the emergency room for evaluation of several medical complaints.  Patient reports that for the last 2 weeks she has had dysuria.  She was seen here 2 weeks ago and treated with antibiotics.  She reports that her symptoms did not improve.  She is also complaining of dull bilateral flank pain which has been going on for 2 weeks as well.  Patient denies abdominal pain, nausea or vomiting, fever or chills.  She is also complaining of shortness of breath which seems to be chronic since she had COVID but over the last 2 weeks she has felt worse.  She reports that the shortness of breath is intermittent and can happen both at rest and with exertion.  She denies body aches, cough, fever, chest pain.  She denies any prior history of PE or DVT, hemoptysis or exogenous hormones, leg pain or swelling  Past Medical History:  Diagnosis Date   Diabetes mellitus without complication (HCC)    Hyperlipidemia    Hypothyroid    Nephritis     Patient Active Problem List   Diagnosis Date Noted   Pericardial effusion 02/18/2020   Personal history of COVID-19 02/18/2020   Transaminitis 02/18/2020   Thyroid nodule 02/18/2020   SOB (shortness of breath) 02/18/2020   Hypothyroidism 02/18/2020    Past Surgical History:  Procedure Laterality Date   ABDOMINAL HYSTERECTOMY     CESAREAN SECTION     CHOLECYSTECTOMY     KNEE SURGERY Bilateral    SINUS EXPLORATION     sinus surgery    Prior to Admission medications   Medication  Sig Start Date End Date Taking? Authorizing Provider  HYDROcodone-acetaminophen (NORCO/VICODIN) 5-325 MG tablet Take 1 tablet by mouth every 12 (twelve) hours as needed for moderate pain. 11/14/20 11/14/21  Magnant, Charles L, PA-C  albuterol (VENTOLIN HFA) 108 (90 Base) MCG/ACT inhaler TAKE 2 PUFFS BY MOUTH EVERY 6 HOURS AS NEEDED FOR WHEEZE OR SHORTNESS OF BREATH 11/06/20   Grayce Sessions, NP  aspirin EC 81 MG tablet Take 81 mg by mouth at bedtime.    [provider]  chlorpheniramine-HYDROcodone (TUSSIONEX PENNKINETIC ER) 10-8 MG/5ML SUER Take 5 mLs by mouth at bedtime as needed for cough. 09/14/20   Chesley Noon, MD  cyclobenzaprine (FLEXERIL) 10 MG tablet Take 1 tablet (10 mg total) by mouth 3 (three) times daily as needed for muscle spasms. 09/05/20   Magnant, Joycie Peek, PA-C  dextromethorphan-guaiFENesin (MUCINEX DM) 30-600 MG 12hr tablet Take 1 tablet by mouth 2 (two) times daily. 08/08/20   Grayce Sessions, NP  diphenhydramine-acetaminophen (TYLENOL PM) 25-500 MG TABS tablet Take 1 tablet by mouth at bedtime as needed (sleep/pain).    [provider]  ezetimibe (ZETIA) 10 MG tablet Take 1 tablet (10 mg total) by mouth daily. 08/13/20   Grayce Sessions, NP  furosemide (LASIX) 40 MG tablet Take 1 tablet (40 mg total) by mouth daily as needed for fluid or edema. 02/20/20 02/19/21  Leatha Gilding, MD  glipiZIDE (GLUCOTROL XL) 10 MG 24 hr tablet Take 1 tablet (10 mg total) by mouth 2 (two)  times daily after a meal. 08/13/20   Romero Belling, MD  glucose blood (CONTOUR NEXT TEST) test strip Use to check BS 2x a day 09/02/20   Romero Belling, MD  insulin glargine (LANTUS SOLOSTAR) 100 UNIT/ML Solostar Pen Inject 20 Units into the skin every morning. And pen needles 1/day 08/13/20   Romero Belling, MD  ipratropium (ATROVENT HFA) 17 MCG/ACT inhaler Inhale 1 puff into the lungs 3 (three) times daily. 08/26/20 08/26/21  Orvil Feil, PA-C  ketorolac (TORADOL) 10 MG tablet Take 1  tablet (10 mg total) by mouth every 6 (six) hours as needed. 05/31/20   Triplett, Rulon Eisenmenger B, FNP  levothyroxine (SYNTHROID) 100 MCG tablet Take 1 tablet (100 mcg total) by mouth daily. 09/18/20   Romero Belling, MD  metFORMIN (GLUCOPHAGE-XR) 500 MG 24 hr tablet TAKE 2 TABLETS TWICE A DAY WITH A MEAL 09/18/20   Romero Belling, MD  polyethylene glycol powder (GLYCOLAX/MIRALAX) 17 GM/SCOOP powder Take 17 g by mouth daily as needed for moderate constipation. 06/15/20   Minna Antis, MD  potassium chloride SA (KLOR-CON) 20 MEQ tablet Take 1 tablet (20 mEq total) by mouth daily as needed (when you take Lasix). 02/20/20   Leatha Gilding, MD  simvastatin (ZOCOR) 80 MG tablet Take 1 tablet (80 mg total) by mouth at bedtime. 08/13/20   Grayce Sessions, NP  VALIUM 10 MG tablet Take 10 mg by mouth at bedtime. 02/11/20   [provider]    Allergies Celebrex [celecoxib]  Family History  Problem Relation Age of Onset   Hypothyroidism Mother    Diabetes Mother    Heart disease Father    Heart disease Brother     Social History Social History   Tobacco Use   Smoking status: Never   Smokeless tobacco: Never  Substance Use Topics   Alcohol use: Yes    Comment: Rare   Drug use: Not Currently    Review of Systems  Constitutional: Negative for fever. Eyes: Negative for visual changes. ENT: Negative for sore throat. Neck: No neck pain  Cardiovascular: Negative for chest pain. Respiratory: + shortness of breath. Gastrointestinal: Negative for abdominal pain, vomiting or diarrhea. Genitourinary: + dysuria, b/l flank pain Musculoskeletal: Negative for back pain. Skin: Negative for rash. Neurological: Negative for headaches, weakness or numbness. Psych: No SI or HI  ____________________________________________   PHYSICAL EXAM:  VITAL SIGNS: ED Triage Vitals  Enc Vitals Group     BP 12/14/20 1723 115/85     Pulse Rate 12/14/20 1723 91     Resp 12/14/20 1723 20     Temp 12/14/20  1723 98.1 F (36.7 C)     Temp Source 12/14/20 1723 Oral     SpO2 12/14/20 1723 96 %     Weight 12/14/20 1724 185 lb (83.9 kg)     Height 12/14/20 1724 5\' 6"  (1.676 m)     Head Circumference --      Peak Flow --      Pain Score 12/14/20 1723 7     Pain Loc --      Pain Edu? --      Excl. in GC? --     Constitutional: Alert and oriented. Well appearing and in no apparent distress. HEENT:      Head: Normocephalic and atraumatic.         Eyes: Conjunctivae are normal. Sclera is non-icteric.       Mouth/Throat: Mucous membranes are moist.       Neck:  Supple with no signs of meningismus. Cardiovascular: Regular rate and rhythm. No murmurs, gallops, or rubs. 2+ symmetrical distal pulses are present in all extremities. No JVD. Respiratory: Normal respiratory effort. Lungs are clear to auscultation bilaterally.  Gastrointestinal: Soft, non tender, and non distended with positive bowel sounds. No rebound or guarding. Genitourinary: No CVA tenderness. Vaginal mucosa is irritated  Musculoskeletal:  No edema, cyanosis, or erythema of extremities. Neurologic: Normal speech and language. Face is symmetric. Moving all extremities. No gross focal neurologic deficits are appreciated. Skin: Skin is warm, dry and intact. No rash noted. Psychiatric: Mood and affect are normal. Speech and behavior are normal.  ____________________________________________   LABS (all labs ordered are listed, but only abnormal results are displayed)  Labs Reviewed  LACTIC ACID, PLASMA - Abnormal; Notable for the following components:      Result Value   Lactic Acid, Venous 2.3 (*)    All other components within normal limits  COMPREHENSIVE METABOLIC PANEL - Abnormal; Notable for the following components:   Sodium 131 (*)    CO2 20 (*)    Glucose, Bld 422 (*)    BUN 23 (*)    AST 44 (*)    ALT 90 (*)    Alkaline Phosphatase 127 (*)    All other components within normal limits  URINALYSIS, ROUTINE W REFLEX  MICROSCOPIC - Abnormal; Notable for the following components:   Glucose, UA 500 (*)    Ketones, ur TRACE (*)    Leukocytes,Ua TRACE (*)    All other components within normal limits  URINALYSIS, MICROSCOPIC (REFLEX) - Abnormal; Notable for the following components:   Bacteria, UA RARE (*)    All other components within normal limits  CBG MONITORING, ED - Abnormal; Notable for the following components:   Glucose-Capillary 279 (*)    All other components within normal limits  RESP PANEL BY RT-PCR (FLU A&B, COVID) ARPGX2  LACTIC ACID, PLASMA  CBC WITH DIFFERENTIAL/PLATELET  CBG MONITORING, ED  TROPONIN I (HIGH SENSITIVITY)   ____________________________________________  EKG   ED ECG REPORT I, Nita Sickle, the attending physician, personally viewed and interpreted this ECG.  Sinus rhythm with a rate of 71, normal intervals, normal axis, no ST elevations or depressions ____________________________________________  RADIOLOGY  I have personally reviewed the images performed during this visit and I agree with the Radiologist's read.   Interpretation by Radiologist:  CT Angio Chest PE W and/or Wo Contrast  Result Date: 12/15/2020 CLINICAL DATA:  Chest pain or shortness of breath. Pleural effusion suspected. Bilateral flank pain EXAM: CT ANGIOGRAPHY CHEST WITH CONTRAST TECHNIQUE: Multidetector CT imaging of the chest was performed using the standard protocol during bolus administration of intravenous contrast. Multiplanar CT image reconstructions and MIPs were obtained to evaluate the vascular anatomy. CONTRAST:  66mL OMNIPAQUE IOHEXOL 350 MG/ML SOLN COMPARISON:  02/17/2020 FINDINGS: Cardiovascular: Satisfactory opacification of the pulmonary arteries to the segmental level. No evidence of pulmonary embolism. Normal heart size. No pericardial effusion. Mediastinum/Nodes: Negative for adenopathy or mass. Lungs/Pleura: There is no edema, consolidation, effusion, or pneumothorax. Upper  Abdomen: Negative Musculoskeletal: Stable bone islands in right ribs. No acute finding. Review of the MIP images confirms the above findings. IMPRESSION: Negative for pulmonary embolism or other acute finding. Electronically Signed   By: Tiburcio Pea M.D.   On: 12/15/2020 05:11   CT Renal Stone Study  Result Date: 12/15/2020 CLINICAL DATA:  UTI symptoms with bilateral flank pain. EXAM: CT ABDOMEN AND PELVIS WITHOUT CONTRAST TECHNIQUE: Multidetector CT  imaging of the abdomen and pelvis was performed following the standard protocol without IV contrast. COMPARISON:  Jun 15, 2020 FINDINGS: Lower chest: No acute abnormality. Hepatobiliary: No focal liver abnormality is seen. Status post cholecystectomy. No biliary dilatation. Pancreas: Unremarkable. No pancreatic ductal dilatation or surrounding inflammatory changes. Spleen: Normal in size without focal abnormality. Adrenals/Urinary Tract: Adrenal glands are unremarkable. Kidneys are normal, without renal calculi, focal lesion, or hydronephrosis. Bladder is unremarkable. Stomach/Bowel: Stomach is within normal limits. Appendix appears normal (best seen on coronal reformatted images 32 through 40, CT series 5). Stool is seen throughout the large bowel. No evidence of bowel wall thickening, distention, or inflammatory changes. Vascular/Lymphatic: Aortic atherosclerosis. No enlarged abdominal or pelvic lymph nodes. Reproductive: Status post hysterectomy. No adnexal masses. Other: No abdominal wall hernia or abnormality. Subcentimeter, benign-appearing soft tissue calcifications are again seen adjacent to the anterior aspect of the right psoas muscle. No abdominopelvic ascites. Musculoskeletal: No acute osseous abnormalities are identified. A stable hemangioma is suspected at the level of the L2 vertebral body (axial CT image 42, CT series 2). IMPRESSION: 1. Evidence of prior cholecystectomy and hysterectomy. 2. Large stool burden without evidence of acute or active  process within the abdomen or pelvis. 3. Aortic atherosclerosis. Aortic Atherosclerosis (ICD10-I70.0). Electronically Signed   By: Aram Candela M.D.   On: 12/15/2020 00:19     ____________________________________________   PROCEDURES  Procedure(s) performed:yes .1-3 Lead EKG Interpretation Performed by: Nita Sickle, MD Authorized by: Nita Sickle, MD     Interpretation: non-specific     ECG rate assessment: normal     Rhythm: sinus rhythm     Ectopy: none     Conduction: normal     Critical Care performed:  None ____________________________________________   INITIAL IMPRESSION / ASSESSMENT AND PLAN / ED COURSE  55 y.o. female with a history of diabetes, hypertension, hyperlipidemia, COVID-19 complicated by pericardial effusion, hypothyroidism, chronic shortness of breath who presents to the emergency room for evaluation of several medical complaints.  #dysuria: 2 weeks of symptoms, has vaginal mucosa irritation on exam. UA not particularly infected. Culture sent. Review of prior cultures show patient usually grows E. coli which is now showing some resistance to several antibiotics.  She has no fever, no white count, and a UA that is not very impressive therefore I explained to her that I am very hesitant on putting her on antibiotics at this time because I am concerned that she will continue to develop more resistant bacteria which eventually will lead into a sepsis presentation with a multiresistant bacteria.  We decided to send for culture and if that is positive we will treat then.  In the meantime recommended diaper rash cream for her vaginal mucosa irritation which could be the reason why patient is having dysuria.  CT renal shows no inflammation around the bladder or kidneys, no kidney stones and no other acute pathology for flank pain  #SOB: Although patient says her symptoms started 2 weeks ago review of her records show that this has been an ongoing issue since  patient had COVID in January.  However since patient does not seem to have pyelonephritis, she is complaining of flank pain, and a CT of the abdomen is negative for any acute pathology, and she had COVID back in January I will get a CT angio of the chest to rule out possibility of PE.  That will also help me rule out pneumonia or recurrence of pericardial effusion.  Will check for COVID and flu.  Will get EKG and troponin to rule out ACS, myo/pericarditis. Labs with no signs of anemia.  Interestingly enough patient did have mildly elevated lactic at 2.3 which resolved without any intervention and the repeat was 1.7.  Unclear if the first result was due to prolonged tourniquet or a real lactic acidosis.  She has otherwise no signs of sepsis with no fever, no tachycardia, no tachypnea, no leukocytosis.  She does have hyperglycemia with no evidence of DKA for which she will receive IV fluids.  _________________________ 5:55 AM on 12/15/2020 ----------------------------------------- CT of the chest negative for pericardial effusion, PE, pneumonia, edema.  COVID and flu are negative.  EKG and troponin with no significant abnormalities.  Patient remains well-appearing with normal work of breathing and normal sats.  Her CT of her abdomen does show constipation for which she will be prescribed MiraLAX.  I will hold abx until urine culture is back.  Otherwise patient is to follow-up with her primary care doctor.  Discussed my standard return precautions      _____________________________________________ Please note:  Patient was evaluated in Emergency Department today for the symptoms described in the history of present illness. Patient was evaluated in the context of the global COVID-19 pandemic, which necessitated consideration that the patient might be at risk for infection with the SARS-CoV-2 virus that causes COVID-19. Institutional protocols and algorithms that pertain to the evaluation of patients at risk  for COVID-19 are in a state of rapid change based on information released by regulatory bodies including the CDC and federal and state organizations. These policies and algorithms were followed during the patient's care in the ED.  Some ED evaluations and interventions may be delayed as a result of limited staffing during the pandemic.   [place] Controlled Substance Database was reviewed by me. ____________________________________________   FINAL CLINICAL IMPRESSION(S) / ED DIAGNOSES   Final diagnoses:  Dysuria  Shortness of breath  Vaginal irritation  Slow transit constipation      NEW MEDICATIONS STARTED DURING THIS VISIT:  ED Discharge Orders     None        Note:  This document was prepared using Dragon voice recognition software and may include unintentional dictation errors.    Nita Sickle, MD 12/15/20 [phone]

## 2020-12-15 NOTE — Discharge Instructions (Addendum)
Constipation: Take colace twice a day everyday. Take senna once a day at bedtime. Take daily probiotics. Drink plenty of fluids and eat a diet rich in fiber. For the next 3 days take 1 cap full of miralax in the morning and one in the evening with a large glass of water

## 2020-12-15 NOTE — ED Notes (Signed)
Patient transported to CT 

## 2020-12-15 NOTE — ED Notes (Signed)
Patient verbalizes understanding of discharge instructions. Opportunity for questioning and answers were provided. Armband removed by staff, pt discharged from ED. Ambulated out to lobby  

## 2020-12-18 ENCOUNTER — Telehealth (INDEPENDENT_AMBULATORY_CARE_PROVIDER_SITE_OTHER): Payer: Self-pay | Admitting: Primary Care

## 2020-12-18 NOTE — Telephone Encounter (Signed)
Pt says that she had a urine culture completed at the ED. Pt says that she cant see the results in my-chart. Pt would like to know if her PCP could assist with advising her of her results.   Phone: [phone]

## 2020-12-18 NOTE — Telephone Encounter (Signed)
No urine culture seen by cma. Offered patient appt with PCP to discuss dysuria. Declined. Will go back to ED.

## 2020-12-21 ENCOUNTER — Emergency Department
Admit: 2020-12-21 | Discharge: 2020-12-21 | Disposition: A | Discharge status: Home / Self Care | Payer: PRIVATE HEALTH INSURANCE | Attending: Emergency Medicine

## 2020-12-21 ENCOUNTER — Ambulatory Visit
Admit: 2020-12-21 | Discharge: 2020-12-21 | Disposition: A | Discharge status: Home / Self Care | Payer: PRIVATE HEALTH INSURANCE | Attending: Emergency Medicine

## 2020-12-21 DIAGNOSIS — N10 Acute tubulo-interstitial nephritis: Principal | ICD-10-CM

## 2020-12-29 ENCOUNTER — Ambulatory Visit: Payer: 59

## 2020-12-29 ENCOUNTER — Other Ambulatory Visit: Payer: Self-pay | Admitting: Adult Health

## 2020-12-29 ENCOUNTER — Ambulatory Visit (INDEPENDENT_AMBULATORY_CARE_PROVIDER_SITE_OTHER): Payer: 59 | Admitting: Adult Health

## 2020-12-29 ENCOUNTER — Encounter: Payer: Self-pay | Admitting: Adult Health

## 2020-12-29 ENCOUNTER — Other Ambulatory Visit: Payer: Self-pay

## 2020-12-29 VITALS — BP 128/74 | HR 78 | Wt 181.8 lb

## 2020-12-29 DIAGNOSIS — R0602 Shortness of breath: Secondary | ICD-10-CM

## 2020-12-29 DIAGNOSIS — R079 Chest pain, unspecified: Secondary | ICD-10-CM

## 2020-12-29 DIAGNOSIS — R002 Palpitations: Secondary | ICD-10-CM

## 2020-12-29 DIAGNOSIS — I5032 Chronic diastolic (congestive) heart failure: Secondary | ICD-10-CM | POA: Diagnosis not present

## 2020-12-29 NOTE — Patient Instructions (Addendum)
Medication Instructions:  No changes *If you need a refill on your cardiac medications before your next appointment, please call your pharmacy*   Lab Work: No Labs If you have labs (blood work) drawn today and your tests are completely normal, you will receive your results only by: [place] (if you have MyChart) OR A paper copy in the mail If you have any lab test that is abnormal or we need to change your treatment, we will call you to review the results.   Testing/Procedures: [address] [place] has requested that you have an echocardiogram. Echocardiography is a painless test that uses sound waves to create images of your heart. It provides your doctor with information about the size and shape of your heart and how well your heart's chambers and valves are working. This procedure takes approximately one hour. There are no restrictions for this procedure.   ZIO AT Long term monitor-Live Telemetry  Your physician has requested you wear a ZIO patch monitor for 7 days.  This is a single patch monitor. [employer] supplies one patch monitor per enrollment. Additional  stickers are not available.  Please do not apply patch if you will be having a Nuclear Stress Test, Echocardiogram, Cardiac CT, MRI,  or Chest Xray during the period you would be wearing the monitor. The patch cannot be worn during  these tests. You cannot remove and re-apply the ZIO AT patch monitor.  Your ZIO patch monitor will be mailed 3 day USPS to your address on file. It may take 3-5 days to  receive your monitor after you have been enrolled.  Once you have received your monitor, please review the enclosed instructions. Your monitor has  already been registered assigning a specific monitor serial # to you.   Billing and Patient Assistance Program information  Theodore Demark has been supplied with any insurance information on record for billing. [employer] offers a sliding scale Patient  Assistance Program for patients without insurance, or whose  insurance does not completely cover the cost of the ZIO patch monitor. You must apply for the  Patient Assistance Program to qualify for the discounted rate. To apply, call [employer] at [phone],  select option 4, select option 2 , ask to apply for the Patient Assistance Program, (you can request an  interpreter if needed). [employer] will ask your household income and how many people are in your  household. [employer] will quote your out-of-pocket cost based on this information. They will also be able  to set up a 12 month interest free payment plan if needed.  Applying the monitor   Shave hair from upper left chest.  Hold the abrader disc by orange tab. Rub the abrader in 40 strokes over left upper chest as indicated in  your monitor instructions.  Clean area with 4 enclosed alcohol pads. Use all pads to ensure the area is cleaned thoroughly. Let  dry.  Apply patch as indicated in monitor instructions. Patch will be placed under collarbone on left side of  chest with arrow pointing upward.  Rub patch adhesive wings for 2 minutes. Remove the white label marked "1". Remove the white label  marked "2". Rub patch adhesive wings for 2 additional minutes.  While looking in a mirror, press and release button in center of patch. A small green light will flash 3-4  times. This will be your only indicator that the monitor has been turned on.  Do not shower for the first 24 hours.  You may shower after the first 24 hours.  Press the button if you feel a symptom. You will hear a small click. Record Date, Time and Symptom in  the Patient Log.   Starting the Gateway  In your kit there is a Hydrographic surveyor box the size of a cellphone. This is Airline pilot. It transmits all your  recorded data to [facility]. This box must always stay within 10 feet of you. Open the box and push the *  button. There will be a light that blinks orange and then green a  few times. When the light stops  blinking, the Gateway is connected to the ZIO patch. Call [employer] at [phone] to confirm your monitor is transmitting.  Returning your monitor  Remove your patch and place it inside the [place]. In the lower half of the Gateway there is a white  bag with prepaid postage on it. Place Gateway in bag and seal. Mail package back to [place] as soon as  possible. Your physician should have your final report approximately 7 days after you have mailed back  your monitor. Call [place] at [phone] if you have questions regarding your ZIO AT  patch monitor. Call them immediately if you see an orange light blinking on your monitor.  If your monitor falls off in less than 4 days, contact our Monitor department at [phone]. If your  monitor becomes loose or falls off after 4 days call [employer] at [phone] for suggestions on  securing your monitor  Follow-Up: At [facility], you and your health needs are our priority.  As part of our continuing mission to provide you with exceptional heart care, we have created designated Provider Care Teams.  These Care Teams include your primary Cardiologist (physician) and Advanced Practice Providers (APPs -  Physician Assistants and Nurse Practitioners) who all work together to provide you with the care you need, when you need it.  We recommend signing up for the patient portal called "MyChart".  Sign up information is provided on this After Visit Summary.  MyChart is used to connect with patients for Virtual Visits (Telemedicine).  Patients are able to view lab/test results, encounter notes, upcoming appointments, etc.  Non-urgent messages can be sent to your provider as well.   To learn more about what you can do with MyChart, go to [email or web address].    Your next appointment:   6 month(s)  The format for your next appointment:   In Person  Provider:   Kirk Ruths, MD :1}     Other Instructions Please Schedule appointment with urology

## 2020-12-29 NOTE — Progress Notes (Signed)
Cardiology Office Note   Date:  12/29/2020   ID:  Whitney Thornton 1965/11/18, MRN [id number]  PCP:  Grayce Sessions, NP  Cardiologist:  Winfield Rast CC: Annual follow up diastolic CHF.    History of Present Illness: Whitney Thornton is a 55 y.o. female who presents for ongoing assessment and management of chronic diastolic CHF, dyspnea, with history of Type II diabetes, hyperlipidemia, hypothyroidism, pericardial effusion. Echocardiogram 01/2020 LVEF 60% to 65%.  Left ventricular diastolic parameters are consistent with Grade I diastolic dysfunction   She comes today with multiple somatic complaints.  Has been to the ED several times stating that she has had sepsis, pyelonephritis, and UTIs.  On last visit to an ED she was told that she had a prolonged QT interval which frightened her and she was told to see cardiology.  She has been on multiple regimens of ciprofloxacin and other antibiotics, was most recently given IV, Rocephin in ED at [facility].,  She has not followed up with her urologist or primary care physician.  She speaks at length about her urinary tract infections and pyelonephritis.  She does complain of palpitations if I ask her about them but does not endorse them on her own.  She does have some mild dyspnea on exertion, "something is wrong with me, because I cannot climb stairs like I used to".  She works as a [employer] fast streamer and has noticed a change in her stamina.    Past Medical History:  Diagnosis Date   Diabetes mellitus without complication (HCC)    Hyperlipidemia    Hypothyroid    Nephritis     Past Surgical History:  Procedure Laterality Date   ABDOMINAL HYSTERECTOMY     CESAREAN SECTION     CHOLECYSTECTOMY     KNEE SURGERY Bilateral    SINUS EXPLORATION     sinus surgery     Current Outpatient Medications  Medication Sig Dispense Refill   albuterol (VENTOLIN HFA) 108 (90 Base) MCG/ACT inhaler TAKE 2 PUFFS BY MOUTH  EVERY 6 HOURS AS NEEDED FOR WHEEZE OR SHORTNESS OF BREATH 8.5 each 1   aspirin EC 81 MG tablet Take 81 mg by mouth at bedtime.     ciprofloxacin (CIPRO) 500 MG tablet Take 500 mg by mouth 3 (three) times daily.     cyclobenzaprine (FLEXERIL) 10 MG tablet Take 1 tablet (10 mg total) by mouth 3 (three) times daily as needed for muscle spasms. 60 tablet 3   ezetimibe (ZETIA) 10 MG tablet Take 1 tablet (10 mg total) by mouth daily. 90 tablet 1   glipiZIDE (GLUCOTROL XL) 10 MG 24 hr tablet Take 1 tablet (10 mg total) by mouth 2 (two) times daily after a meal. 180 tablet 0   glucose blood (CONTOUR NEXT TEST) test strip Use to check BS 2x a day 100 each 12   insulin glargine (LANTUS SOLOSTAR) 100 UNIT/ML Solostar Pen Inject 20 Units into the skin every morning. And pen needles 1/day 15 mL PRN   ipratropium (ATROVENT HFA) 17 MCG/ACT inhaler Inhale 1 puff into the lungs 3 (three) times daily. 1 each 2   ketorolac (TORADOL) 10 MG tablet Take 1 tablet (10 mg total) by mouth every 6 (six) hours as needed. 20 tablet 0   levothyroxine (SYNTHROID) 100 MCG tablet Take 1 tablet (100 mcg total) by mouth daily. 90 tablet 1   metFORMIN (GLUCOPHAGE-XR) 500 MG 24 hr tablet TAKE 2 TABLETS TWICE A DAY WITH A MEAL 180  tablet 3   polyethylene glycol powder (GLYCOLAX/MIRALAX) 17 GM/SCOOP powder Take 17 g by mouth daily as needed for moderate constipation. 255 g 0   VALIUM 10 MG tablet Take 10 mg by mouth at bedtime.     No current facility-administered medications for this visit.    Allergies:   Celebrex [celecoxib]    Social History:  The patient  reports that she has never smoked. She has never used smokeless tobacco. She reports current alcohol use. She reports that she does not currently use drugs.   Family History:  The patient's family history includes Diabetes in her mother; Heart disease in her brother and father; Hypothyroidism in her mother.    ROS: All other systems are reviewed and negative. Unless  otherwise mentioned in H&P    PHYSICAL EXAM: VS:  BP 128/74   Pulse 78   Wt 181 lb 12.8 oz (82.5 kg)   SpO2 98%   BMI 29.34 kg/m  , BMI Body mass index is 29.34 kg/m. GEN: Well nourished, well developed, in no acute distress HEENT: normal Neck: no JVD, carotid bruits, or masses Cardiac: RRR; no murmurs, rubs, or gallops,no edema  Respiratory:  Clear to auscultation bilaterally, normal work of breathing GI: soft, nontender, nondistended, + BS MS: no deformity or atrophy Skin: warm and dry, no rash Neuro:  Strength and sensation are intact Psych: euthymic mood, full affect, anxious   EKG:  EKG is ordered today. The ekg ordered today demonstrates normal sinus rhythm with left posterior fascicular block, T wave inversion noted in lead III and aVR.  Heart rate of 82 bpm.   Recent Labs: 02/19/2020: Magnesium 1.7 05/31/2020: B Natriuretic Peptide 9.8 12/14/2020: ALT 90; BUN 23; Creatinine, Ser 0.78; Hemoglobin 15.0; Platelets 288; Potassium 4.2; Sodium 131    Lipid Panel    Component Value Date/Time   CHOL 161 08/08/2020 1013   TRIG 398 (H) 08/08/2020 1013   HDL 32 (L) 08/08/2020 1013   CHOLHDL 5.0 (H) 08/08/2020 1013   LDLCALC 67 08/08/2020 1013      Wt Readings from Last 3 Encounters:  12/29/20 181 lb 12.8 oz (82.5 kg)  12/14/20 185 lb (83.9 kg)  12/01/20 184 lb 15.5 oz (83.9 kg)      Other studies Reviewed: Echocardiogram 02-22-20  1. Left ventricular ejection fraction, by estimation, is 60 to 65%. The  left ventricle has normal function. The left ventricle has no regional  wall motion abnormalities. There is mild concentric left ventricular  hypertrophy. Left ventricular diastolic  parameters are consistent with Grade I diastolic dysfunction (impaired  relaxation).   2. Right ventricular systolic function is normal. The right ventricular  size is normal.   3. The pericardial effusion is posterior to the left ventricle and  anterior to the right ventricle.    4. The mitral valve is normal in structure. No evidence of mitral valve  regurgitation. No evidence of mitral stenosis.   5. The aortic valve is normal in structure. Aortic valve regurgitation is  not visualized. No aortic stenosis is present.   6. The inferior vena cava is normal in size with greater than 50%  respiratory variability, suggesting right atrial pressure of 3 mmHg.   ASSESSMENT AND PLAN:  1.  Palpitations: Was told she had an abnormal EKG with prolonged QT interval.  I was unable to find this in Care Everywhere as she has been to multiple ED's in different towns.  I did an EKG today which revealed normal sinus rhythm  with a left posterior fascicular block.  I will place a 7-day cardiac monitor to evaluate her palpitations.  No changes in medication regimen.  She has been on lots of antibiotics which may have influenced her EKG temporarily.    2.  Chronic diastolic CHF: Does not appear to be volume overloaded today.  No changes in her regimen.  We will continue to follow repeat echocardiogram in the setting of recurrent shortness of breath.  She does have multiple cardiovascular risk factors may need to consider ischemic evaluation if she has reduced EF or continues to be symptomatic.  3.  Frequent UTIs with 1 episode of pyelonephritis: In the past I have added advised her strongly to follow-up with her urologist for ongoing management of this.  She has not seen her urologist frequently going to ED for any treatment.  It is imperative that she follow with urology routinely to avoid recurrent issues.  Most recent UTI was E. coli on culture.  She is currently on ciprofloxacin 500 mg 4 times daily.  4. Type II diabetes; Follow with PCP   Current medicines are reviewed at length with the patient today.  I have spent 25 min's dedicated to the care of this patient on the date of this encounter to include pre-visit review of records, assessment, management and diagnostic testing,with shared  decision making.  Labs/ tests ordered today include: Cardiac monitor and Echocardiogram  Phill Myron. West Pugh, ANP, AACC   12/29/2020 1:17 PM    [facility] [address] [address] Office [phone] Fax [phone]  Notice: This dictation was prepared with Dragon dictation along with smaller phrase technology. Any transcriptional errors that result from this process are unintentional and may not be corrected upon review.

## 2020-12-29 NOTE — Progress Notes (Unsigned)
Enrolled for [employer] to mail a ZIO AT Live Telemetry monitor to patients address on file.  

## 2020-12-30 ENCOUNTER — Ambulatory Visit: Payer: 59 | Admitting: Physical Therapy

## 2021-01-02 ENCOUNTER — Ambulatory Visit: Payer: Self-pay | Admitting: Physical Therapy

## 2021-01-03 ENCOUNTER — Other Ambulatory Visit: Payer: Self-pay | Admitting: Endocrinology

## 2021-01-03 DIAGNOSIS — E119 Type 2 diabetes mellitus without complications: Secondary | ICD-10-CM

## 2021-01-06 ENCOUNTER — Other Ambulatory Visit (INDEPENDENT_AMBULATORY_CARE_PROVIDER_SITE_OTHER): Payer: Self-pay | Admitting: Primary Care

## 2021-01-06 DIAGNOSIS — E782 Mixed hyperlipidemia: Secondary | ICD-10-CM

## 2021-01-07 ENCOUNTER — Telehealth (HOSPITAL_COMMUNITY): Payer: Self-pay | Admitting: Adult Health

## 2021-01-07 ENCOUNTER — Telehealth: Payer: Self-pay | Admitting: *Deleted

## 2021-01-07 ENCOUNTER — Ambulatory Visit
Admit: 2021-01-07 | Discharge: 2021-01-07 | Disposition: A | Discharge status: Home / Self Care | Payer: PRIVATE HEALTH INSURANCE | Attending: Student in an Organized Health Care Education/Training Program

## 2021-01-07 NOTE — Telephone Encounter (Signed)
We have received the following email from [facility].  Please respond directly to the company.  We will authorize [employer] to mail a replacement patch if necessary.    This is a Sales promotion account executive to inform you the patients Zio AT device is in leads off.  We have been unable to contact the patient.  Due to this, we are unable to confirm if the patient is still actively wearing the device.      Account: [facility] [address]  Patient Initials: Whitney Thornton  Patient ID: [id number]  Asset SN: [id number]  Registration date: 12/29/2020  Activation Date: (01/03/2021     If you have any questions, please feel free to contact us at [phone] and reference: [id number]     Thank you,  [facility] Customer Care

## 2021-01-07 NOTE — Telephone Encounter (Signed)
Requested Prescriptions  Pending Prescriptions Disp Refills   ezetimibe (ZETIA) 10 MG tablet [Pharmacy Med Name: Ezetimibe 10 MG Oral Tablet] 90 tablet 3    Sig: TAKE 1 TABLET BY MOUTH  DAILY     Cardiovascular:  Antilipid - Sterol Transport Inhibitors Failed - 01/06/2021 10:29 PM      Failed - HDL in normal range and within 360 days    HDL  Date Value Ref Range Status  08/08/2020 32 (L) >39 mg/dL Final         Failed - Triglycerides in normal range and within 360 days    Triglycerides  Date Value Ref Range Status  08/08/2020 398 (H) 0 - 149 mg/dL Final         Passed - Total Cholesterol in normal range and within 360 days    Cholesterol, Total  Date Value Ref Range Status  08/08/2020 161 100 - 199 mg/dL Final         Passed - LDL in normal range and within 360 days    LDL Chol Calc (NIH)  Date Value Ref Range Status  08/08/2020 67 0 - 99 mg/dL Final         Passed - Valid encounter within last 12 months    Recent Outpatient Visits          5 months ago Productive cough   [facility] Grayce Sessions, NP   11 months ago Exposure to COVID-19 virus   [facility] Grayce Sessions, NP   1 year ago Urinary tract infection without hematuria, site unspecified   [facility] Grayce Sessions, NP   1 year ago Dysuria   [facility] Grayce Sessions, NP   1 year ago Dysuria   [facility]      Future Appointments            In 2 weeks Romero Belling, MD [facility]

## 2021-01-07 NOTE — Telephone Encounter (Signed)
Patient cancelled echocardiogram for 01/08/21 for reason below:  01/07/21 patient cancelled per VM and does not wish to reschedule. Pt states she has been in the hospital. [facility]   Order will be removed from the Active ECHO WQ If patient calls back in the future to reschedule we can reinstaste the order. . Thank you

## 2021-01-08 ENCOUNTER — Ambulatory Visit (HOSPITAL_COMMUNITY): Payer: 59

## 2021-01-12 ENCOUNTER — Other Ambulatory Visit (INDEPENDENT_AMBULATORY_CARE_PROVIDER_SITE_OTHER): Payer: Self-pay | Admitting: Primary Care

## 2021-01-14 ENCOUNTER — Other Ambulatory Visit (HOSPITAL_COMMUNITY): Payer: 59

## 2021-01-20 ENCOUNTER — Other Ambulatory Visit: Payer: Self-pay

## 2021-01-20 MED ORDER — MICROLET LANCETS MISC: 3 refills | Status: DC

## 2021-01-21 ENCOUNTER — Ambulatory Visit (INDEPENDENT_AMBULATORY_CARE_PROVIDER_SITE_OTHER): Payer: 59 | Admitting: Endocrinology

## 2021-01-21 ENCOUNTER — Other Ambulatory Visit: Payer: Self-pay

## 2021-01-21 VITALS — BP 120/72 | HR 91 | Ht 66.0 in | Wt 180.2 lb

## 2021-01-21 DIAGNOSIS — E119 Type 2 diabetes mellitus without complications: Secondary | ICD-10-CM | POA: Diagnosis not present

## 2021-01-21 LAB — POCT GLYCOSYLATED HEMOGLOBIN (HGB A1C): Hemoglobin A1C: 9.9 % — AB (ref 4.0–5.6)

## 2021-01-21 MED ORDER — CONTOUR MONITOR DEVI: 1.0000 | Freq: Once | 0 refills | Status: DC

## 2021-01-21 MED ORDER — LEVOTHYROXINE SODIUM 125 MCG PO TABS: 125.0000 ug | ORAL_TABLET | Freq: Every day | ORAL | 3 refills | Status: DC

## 2021-01-21 MED ORDER — LANTUS SOLOSTAR 100 UNIT/ML ~~LOC~~ SOPN: 30.0000 [IU] | PEN_INJECTOR | SUBCUTANEOUS | 99 refills | Status: AC

## 2021-01-21 MED ORDER — GLIPIZIDE ER 10 MG PO TB24: 10.0000 mg | ORAL_TABLET | Freq: Every day | ORAL | 0 refills | Status: DC

## 2021-01-21 NOTE — Progress Notes (Signed)
Subjective:    Patient ID: Whitney Thornton, female    DOB: 08-15-1965, 56 y.o.   MRN: [id number]  HPI Pt returns for f/u of diabetes mellitus: DM type: Insulin-requiring type 2.  Dx'ed: 2018 Complications: none Therapy: insulin since dx GDM: never DKA: never Severe hypoglycemia: never Pancreatitis: never Pancreatic imaging: normal on 2022 CT SDOH: none Other: She does not check cbg.  She declines multiple daily injections. Interval history: She takes meds as rx'ed.  no cbg record, but states cbg's vary from 120-420.  There is no trend throughout the day. She says UTI is increasing glucose.   She also has chronic hypothyroidism.  [place] (2022): Enlarged and diffusely heterogeneous thyroid gland. No thyroid nodules Past Medical History:  Diagnosis Date   Diabetes mellitus without complication (HCC)    Hyperlipidemia    Hypothyroid    Nephritis     Past Surgical History:  Procedure Laterality Date   ABDOMINAL HYSTERECTOMY     CESAREAN SECTION     CHOLECYSTECTOMY     KNEE SURGERY Bilateral    SINUS EXPLORATION     sinus surgery    Social History   Socioeconomic History   Marital status: Married    Spouse name: Not on file   Number of children: 4   Years of education: Not on file   Highest education level: Not on file  Occupational History   Not on file  Tobacco Use   Smoking status: Never   Smokeless tobacco: Never  Substance and Sexual Activity   Alcohol use: Yes    Comment: Rare   Drug use: Not Currently   Sexual activity: Not on file  Other Topics Concern   Not on file  Social History Narrative   Not on file   Social Determinants of Health   Financial Resource Strain: Not on file  Food Insecurity: Not on file  Transportation Needs: Not on file  Physical Activity: Not on file  Stress: Not on file  Social Connections: Not on file  Intimate Partner Violence: Not on file    Current Outpatient Medications on File Prior to Visit  Medication  Sig Dispense Refill   albuterol (VENTOLIN HFA) 108 (90 Base) MCG/ACT inhaler TAKE 2 PUFFS BY MOUTH EVERY 6 HOURS AS NEEDED FOR WHEEZE OR SHORTNESS OF BREATH 8.5 each 1   aspirin EC 81 MG tablet Take 81 mg by mouth at bedtime.     ciprofloxacin (CIPRO) 500 MG tablet Take 500 mg by mouth 3 (three) times daily.     cyclobenzaprine (FLEXERIL) 10 MG tablet Take 1 tablet (10 mg total) by mouth 3 (three) times daily as needed for muscle spasms. 60 tablet 3   ezetimibe (ZETIA) 10 MG tablet TAKE 1 TABLET BY MOUTH  DAILY 90 tablet 1   glucose blood (CONTOUR NEXT TEST) test strip Use to check BS 2x a day 100 each 12   ipratropium (ATROVENT HFA) 17 MCG/ACT inhaler Inhale 1 puff into the lungs 3 (three) times daily. 1 each 2   ketorolac (TORADOL) 10 MG tablet Take 1 tablet (10 mg total) by mouth every 6 (six) hours as needed. 20 tablet 0   metFORMIN (GLUCOPHAGE-XR) 500 MG 24 hr tablet TAKE 2 TABLETS TWICE A DAY WITH A MEAL 180 tablet 3   Microlet Lancets MISC Use as needed to check blood sugar 100 each 3   polyethylene glycol powder (GLYCOLAX/MIRALAX) 17 GM/SCOOP powder Take 17 g by mouth daily as needed for moderate constipation. 255 g  0   VALIUM 10 MG tablet Take 10 mg by mouth at bedtime.     No current facility-administered medications on file prior to visit.    Allergies  Allergen Reactions   Celebrex [Celecoxib] Other (See Comments)    Patients reports weakness/ signs of stroke when she takes this.    Family History  Problem Relation Age of Onset   Hypothyroidism Mother    Diabetes Mother    Heart disease Father    Heart disease Brother     BP 120/72    Pulse 91    Ht 5\' 6"  (1.676 m)    Wt 180 lb 3.2 oz (81.7 kg)    SpO2 97%    BMI 29.09 kg/m    Review of Systems     Objective:   Physical Exam    outside test results are reviewed: TSH=10.9    Assessment & Plan:  Insulin-requiring type 2 DM: uncontrolled Hypothyroidism: uncontrolled  Patient Instructions  Let's check the  ultrasound.  you will receive a phone call, about a day and time for an appointment.  check your blood sugar once a day.  vary the time of day when you check, between before the 3 meals, and at bedtime.  also check if you have symptoms of your blood sugar being too high or too low.  please keep a record of the readings and bring it to your next appointment here (or you can bring the meter itself).  You can write it on any piece of paper.  please call sooner if your blood sugar goes below 70, or if most of your readings are over 200.   I have sent 3 prescription to your pharmacy, to increase the Lantus, to reduce the glipizide, and to increase the levothyroxine. Please come back for a follow-up appointment in 1 month.

## 2021-01-21 NOTE — Patient Instructions (Addendum)
Let's check the ultrasound.  you will receive a phone call, about a day and time for an appointment.  check your blood sugar once a day.  vary the time of day when you check, between before the 3 meals, and at bedtime.  also check if you have symptoms of your blood sugar being too high or too low.  please keep a record of the readings and bring it to your next appointment here (or you can bring the meter itself).  You can write it on any piece of paper.  please call us sooner if your blood sugar goes below 70, or if most of your readings are over 200.   I have sent 3 prescription to your pharmacy, to increase the Lantus, to reduce the glipizide, and to increase the levothyroxine. Please come back for a follow-up appointment in 1 month.

## 2021-01-26 ENCOUNTER — Ambulatory Visit (HOSPITAL_BASED_OUTPATIENT_CLINIC_OR_DEPARTMENT_OTHER): Payer: 59 | Admitting: Family

## 2021-02-19 ENCOUNTER — Other Ambulatory Visit (INDEPENDENT_AMBULATORY_CARE_PROVIDER_SITE_OTHER): Payer: Self-pay | Admitting: Primary Care

## 2021-02-19 ENCOUNTER — Other Ambulatory Visit: Payer: Self-pay | Admitting: Surgical

## 2021-02-24 ENCOUNTER — Ambulatory Visit: Payer: 59 | Admitting: Endocrinology

## 2021-03-06 ENCOUNTER — Ambulatory Visit: Admit: 2021-03-06 | Discharge: 2021-03-07 | Payer: PRIVATE HEALTH INSURANCE

## 2021-03-08 ENCOUNTER — Ambulatory Visit: Admit: 2021-03-08 | Discharge: 2021-03-09 | Payer: PRIVATE HEALTH INSURANCE

## 2021-03-08 MED ORDER — NITROFURANTOIN MONOHYDRATE/MACROCRYSTALS 100 MG CAPSULE
ORAL_CAPSULE | Freq: Two times a day (BID) | ORAL | 0 refills | 5 days | Status: CP
Start: 2021-03-08 — End: 2021-03-13

## 2021-03-12 ENCOUNTER — Other Ambulatory Visit (INDEPENDENT_AMBULATORY_CARE_PROVIDER_SITE_OTHER): Payer: Self-pay | Admitting: Primary Care

## 2021-03-18 ENCOUNTER — Encounter: Payer: Self-pay | Admitting: Nutrition

## 2021-03-19 ENCOUNTER — Other Ambulatory Visit: Payer: Self-pay | Admitting: Endocrinology

## 2021-03-20 ENCOUNTER — Ambulatory Visit: Admit: 2021-03-20 | Discharge: 2021-03-21 | Payer: PRIVATE HEALTH INSURANCE

## 2021-03-20 DIAGNOSIS — Z1231 Encounter for screening mammogram for malignant neoplasm of breast: Principal | ICD-10-CM

## 2021-03-20 DIAGNOSIS — F321 Major depressive disorder, single episode, moderate: Principal | ICD-10-CM

## 2021-03-20 DIAGNOSIS — N3010 Interstitial cystitis (chronic) without hematuria: Principal | ICD-10-CM

## 2021-03-20 DIAGNOSIS — R300 Dysuria: Principal | ICD-10-CM

## 2021-03-20 DIAGNOSIS — E119 Type 2 diabetes mellitus without complications: Principal | ICD-10-CM

## 2021-03-20 DIAGNOSIS — R0602 Shortness of breath: Principal | ICD-10-CM

## 2021-03-20 DIAGNOSIS — N898 Other specified noninflammatory disorders of vagina: Principal | ICD-10-CM

## 2021-03-20 MED ORDER — AMITRIPTYLINE 25 MG TABLET
ORAL_TABLET | Freq: Every evening | ORAL | 3 refills | 90 days | Status: CP
Start: 2021-03-20 — End: 2022-03-20

## 2021-03-20 MED ORDER — METFORMIN 500 MG TABLET
ORAL_TABLET | Freq: Two times a day (BID) | ORAL | 0 refills | 45 days | Status: CP
Start: 2021-03-20 — End: ?

## 2021-03-23 MED ORDER — CONJUGATED ESTROGENS 0.625 MG/GRAM VAGINAL CREAM
VAGINAL | 11 refills | 28 days | Status: CP
Start: 2021-03-23 — End: 2022-03-23

## 2021-04-03 ENCOUNTER — Ambulatory Visit: Admit: 2021-04-03 | Payer: PRIVATE HEALTH INSURANCE

## 2021-04-06 ENCOUNTER — Ambulatory Visit: Admit: 2021-04-06 | Discharge: 2021-04-07 | Payer: PRIVATE HEALTH INSURANCE

## 2021-04-14 ENCOUNTER — Other Ambulatory Visit: Payer: Self-pay | Admitting: Endocrinology

## 2021-04-14 DIAGNOSIS — E119 Type 2 diabetes mellitus without complications: Secondary | ICD-10-CM

## 2021-04-17 MED ORDER — KETOROLAC 10 MG TABLET
ORAL_TABLET | Freq: Four times a day (QID) | ORAL | 0 refills | 5 days | Status: CP | PRN
Start: 2021-04-17 — End: ?

## 2021-04-17 NOTE — Telephone Encounter (Signed)
Need to be re-evaluated prior to pain medication refill, especially since she never followed up after last visit

## 2021-04-18 ENCOUNTER — Other Ambulatory Visit: Payer: Self-pay | Admitting: Surgical

## 2021-04-27 DIAGNOSIS — R300 Dysuria: Principal | ICD-10-CM

## 2021-04-28 ENCOUNTER — Ambulatory Visit: Admit: 2021-04-28 | Discharge: 2021-04-29 | Payer: PRIVATE HEALTH INSURANCE

## 2021-04-28 MED ORDER — CIPROFLOXACIN 500 MG TABLET
ORAL_TABLET | Freq: Two times a day (BID) | ORAL | 0 refills | 7 days | Status: CP
Start: 2021-04-28 — End: 2021-05-05

## 2021-04-30 DIAGNOSIS — N12 Tubulo-interstitial nephritis, not specified as acute or chronic: Principal | ICD-10-CM

## 2021-04-30 DIAGNOSIS — E119 Type 2 diabetes mellitus without complications: Principal | ICD-10-CM

## 2021-04-30 MED ORDER — METFORMIN 500 MG TABLET
ORAL_TABLET | Freq: Two times a day (BID) | ORAL | 0 refills | 45 days | Status: CP
Start: 2021-04-30 — End: ?

## 2021-05-01 ENCOUNTER — Ambulatory Visit (INDEPENDENT_AMBULATORY_CARE_PROVIDER_SITE_OTHER): Payer: 59

## 2021-05-01 ENCOUNTER — Ambulatory Visit (INDEPENDENT_AMBULATORY_CARE_PROVIDER_SITE_OTHER): Payer: 59 | Admitting: Surgical

## 2021-05-01 ENCOUNTER — Encounter: Payer: Self-pay | Admitting: Surgical

## 2021-05-01 ENCOUNTER — Ambulatory Visit
Admit: 2021-05-01 | Discharge: 2021-05-01 | Disposition: A | Discharge status: Home / Self Care | Payer: PRIVATE HEALTH INSURANCE

## 2021-05-01 VITALS — Ht 66.0 in | Wt 182.0 lb

## 2021-05-01 DIAGNOSIS — M25562 Pain in left knee: Secondary | ICD-10-CM

## 2021-05-01 DIAGNOSIS — M79641 Pain in right hand: Secondary | ICD-10-CM | POA: Diagnosis not present

## 2021-05-01 DIAGNOSIS — M1712 Unilateral primary osteoarthritis, left knee: Secondary | ICD-10-CM

## 2021-05-01 MED ORDER — CEPHALEXIN 500 MG CAPSULE
ORAL_CAPSULE | Freq: Four times a day (QID) | ORAL | 0 refills | 10 days | Status: CP
Start: 2021-05-01 — End: 2021-05-11

## 2021-05-01 MED ORDER — KETOROLAC 10 MG TABLET
ORAL_TABLET | Freq: Four times a day (QID) | ORAL | 0 refills | 5 days | Status: CP | PRN
Start: 2021-05-01 — End: 2021-05-06

## 2021-05-03 ENCOUNTER — Encounter: Payer: Self-pay | Admitting: Surgical

## 2021-05-03 MED ORDER — BUPIVACAINE HCL 0.25 % IJ SOLN
4.0000 mL | INTRAMUSCULAR | Status: AC | PRN
  Administered 2021-05-01: 4 mL via INTRA_ARTICULAR

## 2021-05-03 MED ORDER — LIDOCAINE HCL 1 % IJ SOLN
5.0000 mL | INTRAMUSCULAR | Status: AC | PRN
  Administered 2021-05-01: 5 mL

## 2021-05-03 NOTE — Progress Notes (Signed)
? ?Office Visit Note ?  ?Patient: Whitney Thornton           ?Date of Birth: 1965/01/30           ?MRN: [id number] ?Visit Date: 05/01/2021 ?Requested by: Kerin Perna, NP ?[id number] ?[place],  [place] [address] ?PCP: Kerin Perna, NP ? ?Subjective: ?Chief Complaint  ?Patient presents with  ? Right Hand - Pain  ? Left Knee - Pain  ? ? ?HPI: Whitney Thornton is a 56 y.o. female who presents to the office complaining of left knee and right hand pain.  Patient has history of left knee mild to moderate arthritis.  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  She did not fall forward on her knee.  She states cortisone and gel has not provided any relief.  She has had dextrose injections in the past which have been helpful for her.  Denies any significantly increased swelling or any new ecchymosis.  No groin pain or radicular pain.  No mechanical symptoms or instability but feels like the knee wants to give out on her. ? ?Additionally, she complains of increased hand pain last couple weeks.  She has had chronic hand pain for years with most of her pain localized to the MCP joints of the right hand.  She has tried ibuprofen without relief.  Using heat is helpful.  She feels the joints are swollen compared with her left hand.  She denies any rheumatologic history but does state that her mother had arthritis in her hands pretty severely.  Patient does have a history of psoriasis.  No triggering of the fingers.  Localizes pain to the dorsal aspect of the MCP joints.  No recent injury to her hand.  No wrist pain.  No numbness or tingling..   ?             ?ROS: All systems reviewed are negative as they relate to the chief complaint within the history of present illness.  Patient denies fevers or chills. ? ?Assessment & Plan: ?Visit Diagnoses:  ?1. Primary  osteoarthritis of left knee   ?2. Pain in right hand   ? ? ?Plan: Patient is a 56 year old female who presents for evaluation of left knee and right hand pain.  She has history of left knee osteoarthritis but cortisone and gel injections typically make her pain worse.  She has had dexterous injections previously which do provide good relief but this is not carried by the office any longer.  Left knee radiographs taken today demonstrate no new changes compared with prior radiographs.  Plan is to administer Toradol injection after discussion.  Patient tolerated procedure well. ? ?Regarding her right hand pain, she has history of chronic pain with worsening over the last several weeks.  Radiographs of the right hand taken today demonstrate no observable degenerative changes of the MCP joints where most of her pain lies.  Plan to obtain blood work for evaluation of potential rheumatoid arthritis versus psoriatic arthritis with her history of psoriasis.  If this is positive, consider referral to rheumatologist.  Plan to call her with those results.  Follow-up as needed if no improvement from the injection. ? ?Follow-Up Instructions: No follow-ups on file.  ? ?Orders:  ?Orders Placed This Encounter  ?Procedures  ? XR Hand Complete Right  ? XR KNEE 3  VIEW LEFT  ? Sed Rate (ESR)  ? C-reactive protein  ? Antinuclear Antib (ANA)  ? Rheumatoid Factor  ? Cyclic citrul peptide antibody, IgG  ? ?No orders of the defined types were placed in this encounter. ? ? ? ? Procedures: ?Large Joint Inj: L knee on 05/01/2021 11:42 AM ?Indications: diagnostic evaluation, joint swelling and pain ?Details: 18 G 1.5 in needle, superolateral approach ? ?Arthrogram: No ? ?Medications: 5 mL lidocaine 1 %; 4 mL bupivacaine 0.25 % (1 cc of Toradol mixed with bupivocaine) ?Outcome: tolerated well, no immediate complications ?Procedure, treatment alternatives, risks and benefits explained, specific risks discussed. Consent was given by the patient.  Immediately prior to procedure a time out was called to verify the correct patient, procedure, equipment, support staff and site/side marked as required. Patient was prepped and draped in the usual sterile fashion.  ? ? ? ? ?Clinical Data: ?No additional findings. ? ?Objective: ?Vital Signs: Ht _0  (1.676 m)   Wt 182 lb (82.6 kg)   BMI 29.38 kg/m?  ? ?Physical Exam:  ?Constitutional: Patient appears well-developed ?HEENT:  ?Head: Normocephalic ?Eyes:EOM are normal ?Neck: Normal range of motion ?Cardiovascular: Normal rate ?Pulmonary/chest: Effort normal ?Neurologic: Patient is alert ?Skin: Skin is warm ?Psychiatric: Patient has normal mood and affect ? ?Ortho Exam: Ortho exam demonstrates left knee with no effusion.  Tenderness over the medial lateral joint lines.  0 degrees extension and 115 degrees of knee flexion.  No calf tenderness.  Negative Homans' sign.  Able to form straight leg raise without extensor lag.  No pain with hip range of motion.  No tenderness over the fibular head.  Intact ankle dorsiflexion and plantarflexion.  No ecchymosis noted.  No cellulitis or skin changes noted. ? ?Exam of right hand demonstrate mild swelling and synovitis overlying the MCP joints of the index and middle fingers primarily.  She has tenderness over the dorsal aspect of these joints.  No triggering observed.  No tenderness over the A1 pulleys of any fingers.  No tenderness throughout the right wrist or anatomic snuffbox.  No cellulitis or skin changes noted.  Intact finger abduction and finger extension/flexion. ? ?Specialty Comments:  ?No specialty comments available. ? ?Imaging: ?No results found. ? ? ?PMFS History: ?Patient Active Problem List  ? Diagnosis Date Noted  ? Pericardial effusion 02/18/2020  ? Personal history of COVID-19 02/18/2020  ? Transaminitis 02/18/2020  ? Thyroid nodule 02/18/2020  ? SOB (shortness of breath) 02/18/2020  ? Hypothyroidism 02/18/2020  ? ?Past Medical History:  ?Diagnosis Date  ?  Diabetes mellitus without complication ([place])   ? Hyperlipidemia   ? Hypothyroid   ? Nephritis   ?  ?Family History  ?Problem Relation Age of Onset  ? Hypothyroidism Mother   ? Diabetes Mother   ? Heart disease Father   ? Heart disease Brother   ?  ?Past Surgical History:  ?Procedure Laterality Date  ? ABDOMINAL HYSTERECTOMY    ? CESAREAN SECTION    ? CHOLECYSTECTOMY    ? KNEE SURGERY Bilateral   ? SINUS EXPLORATION    ? sinus surgery  ? ?Social History  ? ?Occupational History  ? Not on file  ?Tobacco Use  ? Smoking status: Never  ? Smokeless tobacco: Never  ?Substance and Sexual Activity  ? Alcohol use: Yes  ?  Comment: Rare  ? Drug use: Not Currently  ? Sexual activity: Not on file  ? ? ? ? ?  ?

## 2021-05-05 LAB — RHEUMATOID FACTOR: Rheumatoid fact SerPl-aCnc: <14 IU/mL (ref <14)

## 2021-05-05 LAB — ANA: Anti Nuclear Antibody (ANA): NEGATIVE

## 2021-05-05 LAB — C-REACTIVE PROTEIN: CRP: 23.1 mg/L — ABNORMAL HIGH (ref <8.0)

## 2021-05-05 LAB — CYCLIC CITRUL PEPTIDE ANTIBODY, IGG: Cyclic Citrullin Peptide Ab: <16 UNITS

## 2021-05-05 LAB — SEDIMENTATION RATE: Sed Rate: 34 mm/h — ABNORMAL HIGH (ref 0–30)

## 2021-05-19 ENCOUNTER — Ambulatory Visit: Admit: 2021-05-19 | Discharge: 2021-05-20 | Payer: PRIVATE HEALTH INSURANCE

## 2021-05-27 ENCOUNTER — Other Ambulatory Visit (INDEPENDENT_AMBULATORY_CARE_PROVIDER_SITE_OTHER): Payer: Self-pay | Admitting: Primary Care

## 2021-05-27 ENCOUNTER — Other Ambulatory Visit: Payer: Self-pay | Admitting: Endocrinology

## 2021-05-27 ENCOUNTER — Ambulatory Visit: Admit: 2021-05-27 | Discharge: 2021-05-28 | Payer: PRIVATE HEALTH INSURANCE

## 2021-05-27 DIAGNOSIS — E119 Type 2 diabetes mellitus without complications: Secondary | ICD-10-CM

## 2021-05-27 DIAGNOSIS — J9801 Acute bronchospasm: Secondary | ICD-10-CM

## 2021-05-27 MED ORDER — ESTRADIOL 0.01% (0.1 MG/GRAM) VAGINAL CREAM
Freq: Every evening | VAGINAL | 11 refills | 30 days | Status: CP
Start: 2021-05-27 — End: 2022-05-27

## 2021-05-28 NOTE — Telephone Encounter (Signed)
Requested Prescriptions  ?Pending Prescriptions Disp Refills  ?? albuterol (VENTOLIN HFA) 108 (90 Base) MCG/ACT inhaler [Pharmacy Med Name: ALBUTEROL HFA (PROAIR) INHALER] 8.5 each 1  ?  Sig: TAKE 2 PUFFS BY MOUTH EVERY 6 HOURS AS NEEDED FOR WHEEZE OR SHORTNESS OF BREATH  ?  ? Pulmonology:  Beta Agonists 2 Passed - 05/27/2021  7:20 PM  ?  ?  Passed - Last BP in normal range  ?  BP Readings from Last 1 Encounters:  ?01/21/21 120/72  ?   ?  ?  Passed - Last Heart Rate in normal range  ?  Pulse Readings from Last 1 Encounters:  ?01/21/21 91  ?   ?  ?  Passed - Valid encounter within last 12 months  ?  Recent Outpatient Visits   ?      ? 9 months ago Productive cough  ? [facility] Grayce Sessions, NP  ? 1 year ago Exposure to COVID-19 virus  ? [facility] Grayce Sessions, NP  ? 1 year ago Urinary tract infection without hematuria, site unspecified  ? [facility] Grayce Sessions, NP  ? 1 year ago Dysuria  ? [facility] Grayce Sessions, NP  ? 1 year ago Dysuria  ? [facility]  ?  ?  ? ?  ?  ?  ? ? ?

## 2021-05-30 DIAGNOSIS — N12 Tubulo-interstitial nephritis, not specified as acute or chronic: Principal | ICD-10-CM

## 2021-05-30 MED ORDER — DOXYCYCLINE HYCLATE 100 MG CAPSULE
ORAL_CAPSULE | Freq: Two times a day (BID) | ORAL | 0 refills | 7 days | Status: CP
Start: 2021-05-30 — End: ?

## 2021-06-01 ENCOUNTER — Other Ambulatory Visit: Payer: Self-pay | Admitting: Endocrinology

## 2021-06-01 DIAGNOSIS — E119 Type 2 diabetes mellitus without complications: Secondary | ICD-10-CM

## 2021-06-12 ENCOUNTER — Ambulatory Visit: Payer: 59 | Admitting: Surgical

## 2021-06-18 ENCOUNTER — Other Ambulatory Visit (INDEPENDENT_AMBULATORY_CARE_PROVIDER_SITE_OTHER): Payer: Self-pay | Admitting: Primary Care

## 2021-06-18 DIAGNOSIS — E782 Mixed hyperlipidemia: Secondary | ICD-10-CM

## 2021-06-19 MED ORDER — EZETIMIBE 10 MG TABLET
ORAL_TABLET | Freq: Every day | ORAL | 3 refills | 90 days | Status: CP
Start: 2021-06-19 — End: ?

## 2021-06-20 ENCOUNTER — Other Ambulatory Visit (INDEPENDENT_AMBULATORY_CARE_PROVIDER_SITE_OTHER): Payer: Self-pay | Admitting: Primary Care

## 2021-06-20 DIAGNOSIS — E782 Mixed hyperlipidemia: Secondary | ICD-10-CM

## 2021-06-22 NOTE — Telephone Encounter (Signed)
Requested Prescriptions  Pending Prescriptions Disp Refills  . ezetimibe (ZETIA) 10 MG tablet [Pharmacy Med Name: Ezetimibe 10 MG Oral Tablet] 90 tablet 3    Sig: TAKE 1 TABLET BY MOUTH DAILY     Cardiovascular:  Antilipid - Sterol Transport Inhibitors Failed - 06/20/2021 12:20 AM      Failed - AST in normal range and within 360 days    AST  Date Value Ref Range Status  12/14/2020 44 (H) 15 - 41 U/L Final         Failed - ALT in normal range and within 360 days    ALT  Date Value Ref Range Status  12/14/2020 90 (H) 0 - 44 U/L Final         Failed - Lipid Panel in normal range within the last 12 months    Cholesterol, Total  Date Value Ref Range Status  08/08/2020 161 100 - 199 mg/dL Final   LDL Chol Calc (NIH)  Date Value Ref Range Status  08/08/2020 67 0 - 99 mg/dL Final   HDL  Date Value Ref Range Status  08/08/2020 32 (L) >39 mg/dL Final   Triglycerides  Date Value Ref Range Status  08/08/2020 398 (H) 0 - 149 mg/dL Final         Passed - Patient is not pregnant      Passed - Valid encounter within last 12 months    Recent Outpatient Visits          10 months ago Productive cough   [facility] Kerin Perna, NP   1 year ago Exposure to COVID-19 virus   [place], [place], NP   1 year ago Urinary tract infection without hematuria, site unspecified   [place], Michelle P, NP   1 year ago Dysuria   [place] Kerin Perna, NP   1 year ago Dysuria   [place]             . meloxicam (MOBIC) 15 MG tablet [Pharmacy Med Name: Meloxicam 15 MG Oral Tablet] 90 tablet 3    Sig: TAKE 1 TABLET BY MOUTH  DAILY     Analgesics:  COX2 Inhibitors Failed - 06/20/2021 12:20 AM      Failed - Manual Review: Labs are only required if the patient has taken medication for more than 8 weeks.      Failed - AST in normal range  and within 360 days    AST  Date Value Ref Range Status  12/14/2020 44 (H) 15 - 41 U/L Final         Failed - ALT in normal range and within 360 days    ALT  Date Value Ref Range Status  12/14/2020 90 (H) 0 - 44 U/L Final         Passed - HGB in normal range and within 360 days    Hemoglobin  Date Value Ref Range Status  12/14/2020 15.0 12.0 - 15.0 g/dL Final  03/15/2019 15.3 11.1 - 15.9 g/dL Final         Passed - Cr in normal range and within 360 days    Creatinine, Ser  Date Value Ref Range Status  12/14/2020 0.78 0.44 - 1.00 mg/dL Final         Passed - HCT in normal range and within 360 days    HCT  Date  Value Ref Range Status  12/14/2020 43.3 36.0 - 46.0 % Final   Hematocrit  Date Value Ref Range Status  03/15/2019 44.7 34.0 - 46.6 % Final         Passed - eGFR is 30 or above and within 360 days    GFR calc Af Amer  Date Value Ref Range Status  07/30/2019 117 >59 mL/min/1.73 Final    Comment:    **[facility] currently reports eGFR in compliance with the current**   recommendations of the [employer]. [facility] will   update reporting as new guidelines are published from the NKF-ASN   Task force.    GFR, Estimated  Date Value Ref Range Status  12/14/2020 >60 >60 mL/min Final    Comment:    (NOTE) Calculated using the CKD-EPI Creatinine Equation (2021)          Passed - Patient is not pregnant      Passed - Valid encounter within last 12 months    Recent Outpatient Visits          10 months ago Productive cough   [place] Kerin Perna, NP   1 year ago Exposure to COVID-19 virus   [place], [place], NP   1 year ago Urinary tract infection without hematuria, site unspecified   [place], [place], NP   1 year ago Dysuria   [place] Kerin Perna, NP   1 year ago Dysuria   [place]

## 2021-06-24 ENCOUNTER — Ambulatory Visit: Admit: 2021-06-24 | Payer: PRIVATE HEALTH INSURANCE

## 2021-06-24 DIAGNOSIS — Z1211 Encounter for screening for malignant neoplasm of colon: Principal | ICD-10-CM

## 2021-06-24 DIAGNOSIS — Z1212 Encounter for screening for malignant neoplasm of rectum: Principal | ICD-10-CM

## 2021-07-07 ENCOUNTER — Other Ambulatory Visit: Payer: Self-pay | Admitting: Surgical

## 2021-07-07 ENCOUNTER — Telehealth: Payer: Self-pay

## 2021-07-07 MED ORDER — CYCLOBENZAPRINE HCL 10 MG PO TABS
ORAL_TABLET | ORAL | 3 refills | Status: DC
Start: 2021-07-07 — End: 2022-05-24

## 2021-07-07 NOTE — Telephone Encounter (Signed)
Sent in RX as requested

## 2021-07-07 NOTE — Telephone Encounter (Signed)
Received fax from [facility] with refill request on behalf of  patient for flexeril 10mg  1 po tid prn spasms #60

## 2021-08-10 ENCOUNTER — Other Ambulatory Visit: Payer: Self-pay | Admitting: Internal Medicine

## 2021-08-10 DIAGNOSIS — E119 Type 2 diabetes mellitus without complications: Secondary | ICD-10-CM

## 2021-08-13 ENCOUNTER — Ambulatory Visit
Admit: 2021-08-13 | Discharge: 2021-08-13 | Disposition: A | Discharge status: Home / Self Care | Payer: PRIVATE HEALTH INSURANCE | Attending: Student in an Organized Health Care Education/Training Program

## 2021-08-13 DIAGNOSIS — J029 Acute pharyngitis, unspecified: Principal | ICD-10-CM

## 2021-08-18 MED ORDER — NITROFURANTOIN MONOHYDRATE/MACROCRYSTALS 100 MG CAPSULE
ORAL_CAPSULE | Freq: Two times a day (BID) | ORAL | 0 refills | 7 days | Status: CP
Start: 2021-08-18 — End: 2021-08-25

## 2021-08-26 ENCOUNTER — Other Ambulatory Visit: Payer: Self-pay | Admitting: Internal Medicine

## 2021-08-26 ENCOUNTER — Other Ambulatory Visit: Payer: Self-pay

## 2021-08-26 DIAGNOSIS — E039 Hypothyroidism, unspecified: Secondary | ICD-10-CM

## 2021-08-26 DIAGNOSIS — E119 Type 2 diabetes mellitus without complications: Secondary | ICD-10-CM

## 2021-08-26 MED ORDER — LEVOTHYROXINE SODIUM 125 MCG PO TABS: 125.0000 ug | ORAL_TABLET | Freq: Every day | ORAL | 3 refills | Status: AC

## 2021-09-13 DIAGNOSIS — N12 Tubulo-interstitial nephritis, not specified as acute or chronic: Principal | ICD-10-CM

## 2021-09-17 ENCOUNTER — Other Ambulatory Visit: Payer: Self-pay

## 2021-09-17 MED ORDER — MICROLET LANCETS MISC: 0 refills | Status: DC

## 2021-09-22 DIAGNOSIS — E119 Type 2 diabetes mellitus without complications: Principal | ICD-10-CM

## 2021-09-22 MED ORDER — METFORMIN 500 MG TABLET
ORAL_TABLET | 0 refills | 0 days | Status: CP
Start: 2021-09-22 — End: ?

## 2021-09-28 ENCOUNTER — Ambulatory Visit: Admit: 2021-09-28 | Discharge: 2021-09-29 | Payer: PRIVATE HEALTH INSURANCE

## 2021-09-28 MED ORDER — CYCLOBENZAPRINE 10 MG TABLET
ORAL_TABLET | Freq: Three times a day (TID) | ORAL | 3 refills | 30 days | Status: CP
Start: 2021-09-28 — End: ?

## 2021-09-28 MED ORDER — METFORMIN ER 500 MG TABLET,EXTENDED RELEASE 24 HR
ORAL_TABLET | Freq: Two times a day (BID) | ORAL | 3 refills | 90 days | Status: CP
Start: 2021-09-28 — End: 2022-09-23

## 2021-09-28 MED ORDER — AMITRIPTYLINE 25 MG TABLET
ORAL_TABLET | Freq: Every evening | ORAL | 3 refills | 45 days | Status: CP
Start: 2021-09-28 — End: 2022-09-28

## 2021-09-28 MED ORDER — PRAVASTATIN 20 MG TABLET
ORAL_TABLET | Freq: Every evening | ORAL | 11 refills | 30 days | Status: CP
Start: 2021-09-28 — End: 2022-09-28

## 2021-09-29 DIAGNOSIS — N3000 Acute cystitis without hematuria: Principal | ICD-10-CM

## 2021-09-29 MED ORDER — NITROFURANTOIN MONOHYDRATE/MACROCRYSTALS 100 MG CAPSULE
ORAL_CAPSULE | Freq: Two times a day (BID) | ORAL | 0 refills | 5 days | Status: CP
Start: 2021-09-29 — End: 2021-10-04

## 2021-10-05 ENCOUNTER — Other Ambulatory Visit: Payer: Self-pay | Admitting: Endocrinology

## 2021-10-05 DIAGNOSIS — E119 Type 2 diabetes mellitus without complications: Secondary | ICD-10-CM

## 2021-10-07 ENCOUNTER — Other Ambulatory Visit: Payer: Self-pay

## 2021-10-07 DIAGNOSIS — E119 Type 2 diabetes mellitus without complications: Secondary | ICD-10-CM

## 2021-10-08 MED ORDER — ACETAMINOPHEN 500 MG TABLET
ORAL_TABLET | Freq: Four times a day (QID) | ORAL | 2 refills | 13 days | Status: CP | PRN
Start: 2021-10-08 — End: 2022-10-08

## 2021-10-08 MED ORDER — MELOXICAM 7.5 MG TABLET
ORAL_TABLET | Freq: Two times a day (BID) | ORAL | 0 refills | 14 days | Status: CP
Start: 2021-10-08 — End: 2021-10-22

## 2021-10-09 ENCOUNTER — Other Ambulatory Visit: Payer: Self-pay | Admitting: Endocrinology

## 2021-10-09 ENCOUNTER — Other Ambulatory Visit (INDEPENDENT_AMBULATORY_CARE_PROVIDER_SITE_OTHER): Payer: Self-pay | Admitting: Primary Care

## 2021-10-09 DIAGNOSIS — E119 Type 2 diabetes mellitus without complications: Secondary | ICD-10-CM

## 2021-10-12 NOTE — Telephone Encounter (Signed)
Medication d/c'd 07/27/2020 - not on med list. Requested Prescriptions  Pending Prescriptions Disp Refills  . meloxicam (MOBIC) 15 MG tablet [Pharmacy Med Name: Meloxicam 15 MG Oral Tablet] 90 tablet 3    Sig: TAKE 1 TABLET BY MOUTH  DAILY     Analgesics:  COX2 Inhibitors Failed - 10/09/2021  7:32 PM      Failed - Manual Review: Labs are only required if the patient has taken medication for more than 8 weeks.      Failed - AST in normal range and within 360 days    AST  Date Value Ref Range Status  12/14/2020 44 (H) 15 - 41 U/L Final         Failed - ALT in normal range and within 360 days    ALT  Date Value Ref Range Status  12/14/2020 90 (H) 0 - 44 U/L Final         Failed - Valid encounter within last 12 months    Recent Outpatient Visits          1 year ago Productive cough   [place] Kerin Perna, NP   1 year ago Exposure to COVID-19 virus   [place], [place], NP   2 years ago Urinary tract infection without hematuria, site unspecified   [place], [place], NP   2 years ago Dysuria   [place] Kerin Perna, NP   2 years ago Dysuria   [place] - HGB in normal range and within 360 days    Hemoglobin  Date Value Ref Range Status  12/14/2020 15.0 12.0 - 15.0 g/dL Final  03/15/2019 15.3 11.1 - 15.9 g/dL Final         Passed - Cr in normal range and within 360 days    Creatinine, Ser  Date Value Ref Range Status  12/14/2020 0.78 0.44 - 1.00 mg/dL Final         Passed - HCT in normal range and within 360 days    HCT  Date Value Ref Range Status  12/14/2020 43.3 36.0 - 46.0 % Final   Hematocrit  Date Value Ref Range Status  03/15/2019 44.7 34.0 - 46.6 % Final         Passed - eGFR is 30 or above and within 360 days    GFR calc Af Amer  Date Value Ref Range Status  07/30/2019  117 >59 mL/min/1.73 Final    Comment:    **[facility] currently reports eGFR in compliance with the current**   recommendations of the [employer]. [facility] will   update reporting as new guidelines are published from the NKF-ASN   Task force.    GFR, Estimated  Date Value Ref Range Status  12/14/2020 >60 >60 mL/min Final    Comment:    (NOTE) Calculated using the CKD-EPI Creatinine Equation (2021)          Passed - Patient is not pregnant

## 2021-10-13 ENCOUNTER — Ambulatory Visit: Admit: 2021-10-13 | Discharge: 2021-10-14 | Payer: PRIVATE HEALTH INSURANCE

## 2021-10-16 ENCOUNTER — Other Ambulatory Visit: Payer: Self-pay

## 2021-10-16 DIAGNOSIS — E039 Hypothyroidism, unspecified: Secondary | ICD-10-CM

## 2021-10-30 ENCOUNTER — Other Ambulatory Visit (INDEPENDENT_AMBULATORY_CARE_PROVIDER_SITE_OTHER): Payer: Self-pay | Admitting: Primary Care

## 2021-10-30 ENCOUNTER — Other Ambulatory Visit: Payer: Self-pay | Admitting: Endocrinology

## 2021-10-30 DIAGNOSIS — E119 Type 2 diabetes mellitus without complications: Secondary | ICD-10-CM

## 2021-10-30 NOTE — Telephone Encounter (Signed)
Requested by interface surescripts. Medication discontinued 07/27/21 Requested Prescriptions  Refused Prescriptions Disp Refills  . meloxicam (MOBIC) 15 MG tablet [Pharmacy Med Name: Meloxicam 15 MG Oral Tablet] 90 tablet 3    Sig: TAKE 1 TABLET BY MOUTH  DAILY     Analgesics:  COX2 Inhibitors Failed - 10/30/2021  1:42 PM      Failed - Manual Review: Labs are only required if the patient has taken medication for more than 8 weeks.      Failed - AST in normal range and within 360 days    AST  Date Value Ref Range Status  12/14/2020 44 (H) 15 - 41 U/L Final         Failed - ALT in normal range and within 360 days    ALT  Date Value Ref Range Status  12/14/2020 90 (H) 0 - 44 U/L Final         Failed - Valid encounter within last 12 months    Recent Outpatient Visits          1 year ago Productive cough   [place] Kerin Perna, NP   1 year ago Exposure to COVID-19 virus   [place], [place], NP   2 years ago Urinary tract infection without hematuria, site unspecified   [place], [place], NP   2 years ago Dysuria   [place] Kerin Perna, NP   2 years ago Dysuria   [place] - HGB in normal range and within 360 days    Hemoglobin  Date Value Ref Range Status  12/14/2020 15.0 12.0 - 15.0 g/dL Final  03/15/2019 15.3 11.1 - 15.9 g/dL Final         Passed - Cr in normal range and within 360 days    Creatinine, Ser  Date Value Ref Range Status  12/14/2020 0.78 0.44 - 1.00 mg/dL Final         Passed - HCT in normal range and within 360 days    HCT  Date Value Ref Range Status  12/14/2020 43.3 36.0 - 46.0 % Final   Hematocrit  Date Value Ref Range Status  03/15/2019 44.7 34.0 - 46.6 % Final         Passed - eGFR is 30 or above and within 360 days    GFR calc Af Amer  Date Value Ref  Range Status  07/30/2019 117 >59 mL/min/1.73 Final    Comment:    **[facility] currently reports eGFR in compliance with the current**   recommendations of the [employer]. [facility] will   update reporting as new guidelines are published from the NKF-ASN   Task force.    GFR, Estimated  Date Value Ref Range Status  12/14/2020 >60 >60 mL/min Final    Comment:    (NOTE) Calculated using the CKD-EPI Creatinine Equation (2021)          Passed - Patient is not pregnant

## 2021-11-11 ENCOUNTER — Ambulatory Visit: Admit: 2021-11-11 | Discharge: 2021-11-12 | Payer: PRIVATE HEALTH INSURANCE

## 2021-11-11 DIAGNOSIS — Z1212 Encounter for screening for malignant neoplasm of rectum: Principal | ICD-10-CM

## 2021-11-11 DIAGNOSIS — K5900 Constipation, unspecified: Principal | ICD-10-CM

## 2021-11-11 DIAGNOSIS — Z1211 Encounter for screening for malignant neoplasm of colon: Principal | ICD-10-CM

## 2021-11-11 MED ORDER — LUBIPROSTONE 24 MCG CAPSULE
ORAL_CAPSULE | Freq: Two times a day (BID) | ORAL | 11 refills | 30 days | Status: CP
Start: 2021-11-11 — End: 2022-11-11

## 2021-12-17 ENCOUNTER — Other Ambulatory Visit: Payer: Self-pay | Admitting: Internal Medicine

## 2021-12-17 DIAGNOSIS — E119 Type 2 diabetes mellitus without complications: Secondary | ICD-10-CM

## 2022-01-24 DIAGNOSIS — N3010 Interstitial cystitis (chronic) without hematuria: Principal | ICD-10-CM

## 2022-01-25 MED ORDER — AMITRIPTYLINE 25 MG TABLET
ORAL_TABLET | Freq: Every evening | ORAL | 2 refills | 90 days | Status: CP
Start: 2022-01-25 — End: ?

## 2022-01-26 IMAGING — CT CT RENAL STONE PROTOCOL
2 of 4 series · 16 of 46 positions shown, 18 images · non-contrast
Comparison: June 15, 2020

CLINICAL DATA: UTI symptoms with bilateral flank pain.

EXAM:
CT ABDOMEN AND PELVIS WITHOUT CONTRAST
TECHNIQUE: Multidetector CT imaging of the abdomen and pelvis was performed
following the standard protocol without IV contrast.

[Series 2: stone full standard · axial · 0.69mm/px · z∈[-494,-24]mm · 13 of 104 slices shown, 15 images]
[im 5/104  soft-tissue]
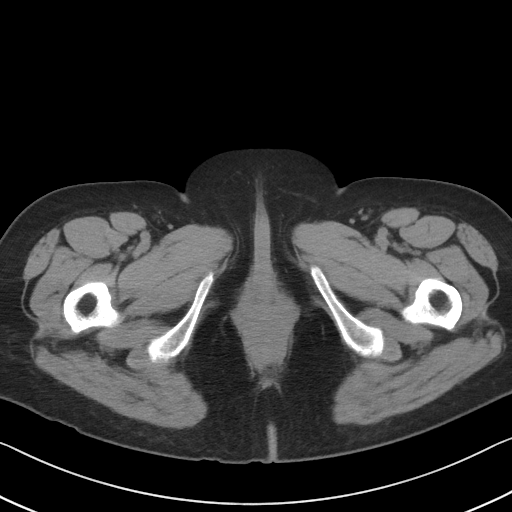
[im 5/104  bone]
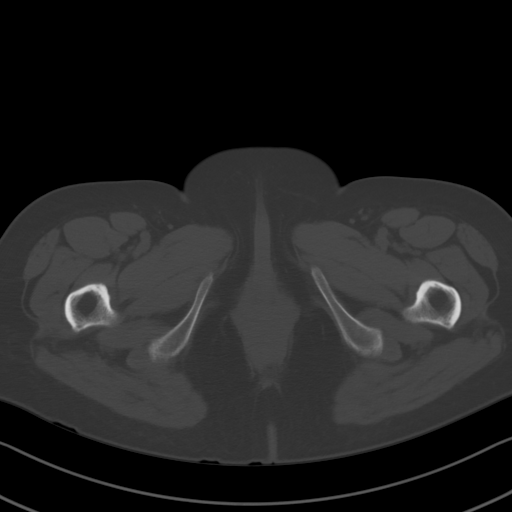
[im 13/104  soft-tissue]
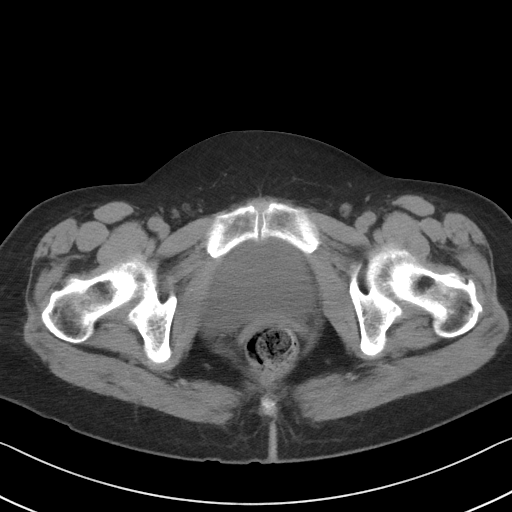
[im 22/104  soft-tissue]
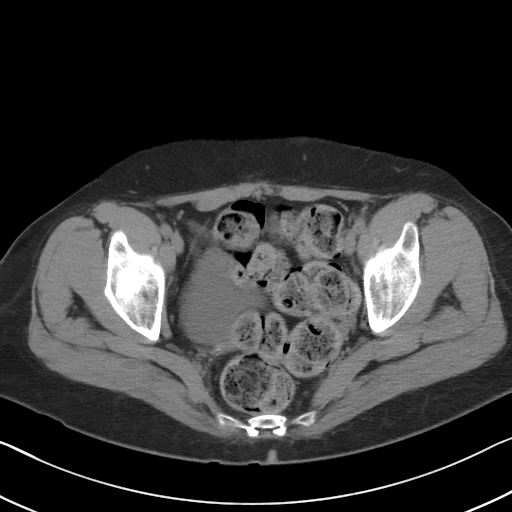
[im 31/104  soft-tissue]
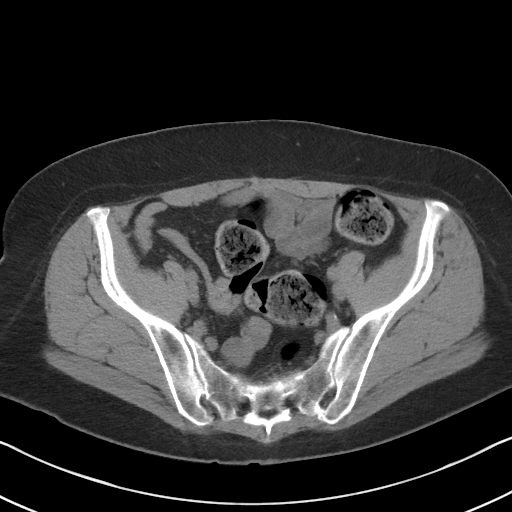
[im 35/104  soft-tissue]
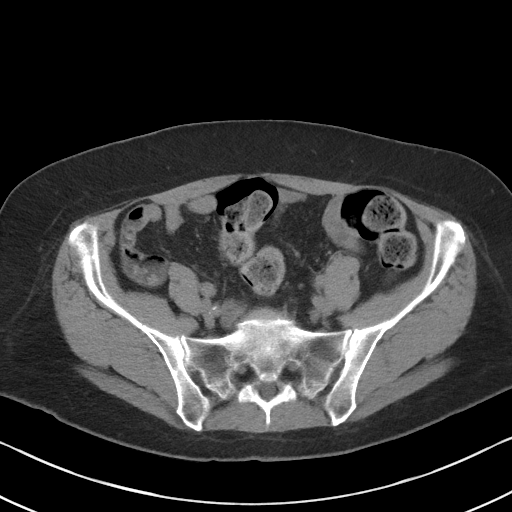
[im 43/104  soft-tissue]
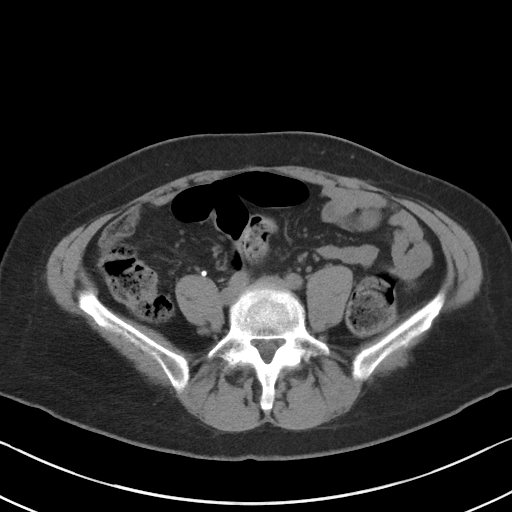
[im 52/104  soft-tissue]
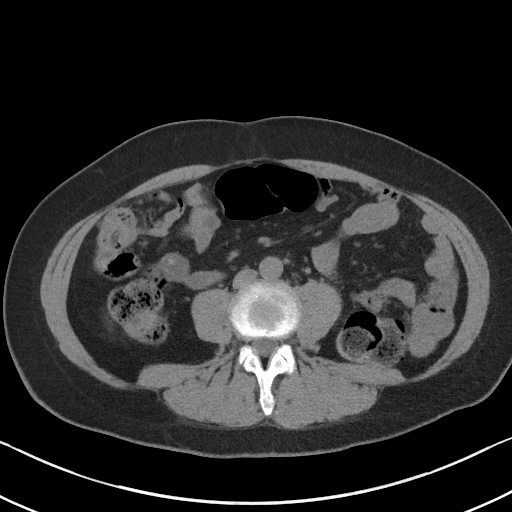
[im 61/104  soft-tissue]
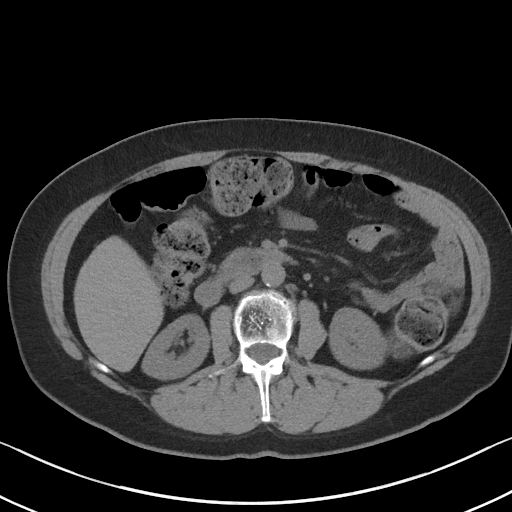
[im 69/104  soft-tissue]
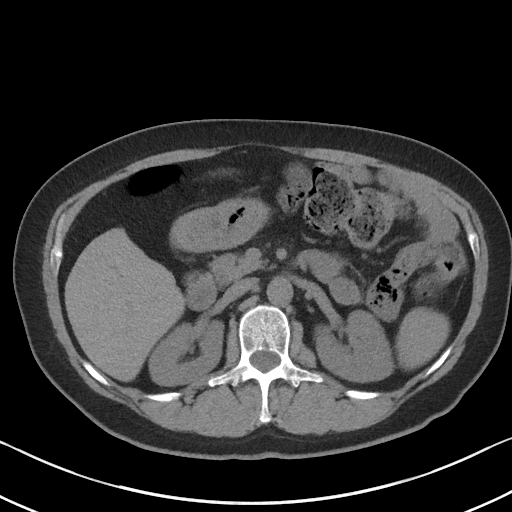
[im 69/104  bone]
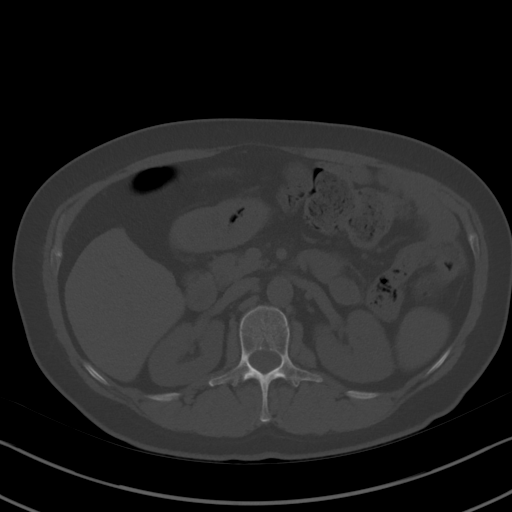
[im 73/104  soft-tissue]
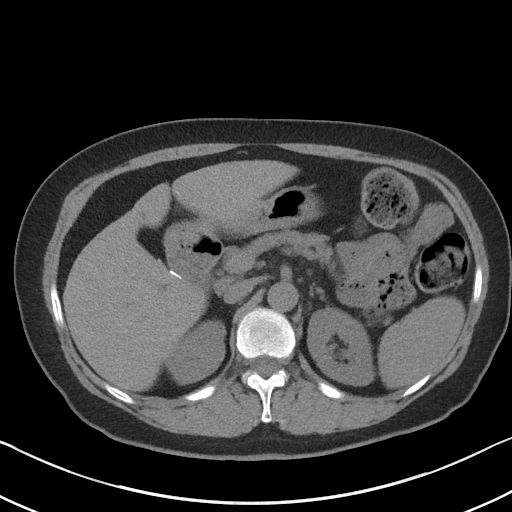
[im 82/104  soft-tissue]
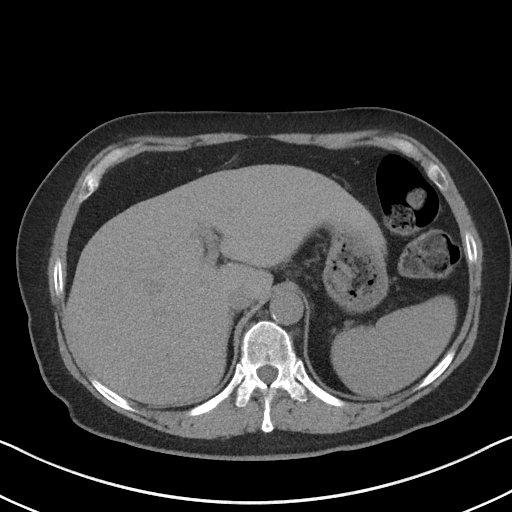
[im 91/104  soft-tissue]
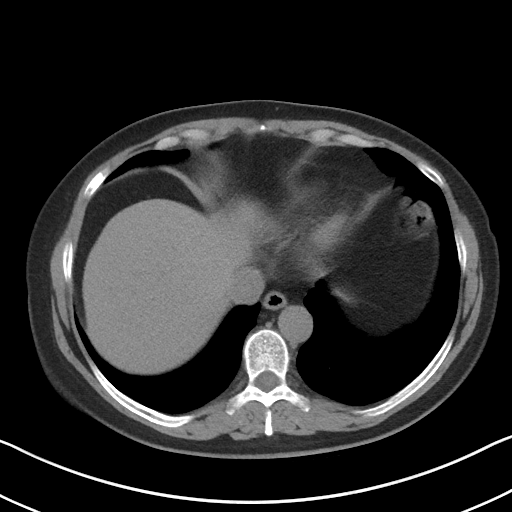
[im 99/104  soft-tissue]
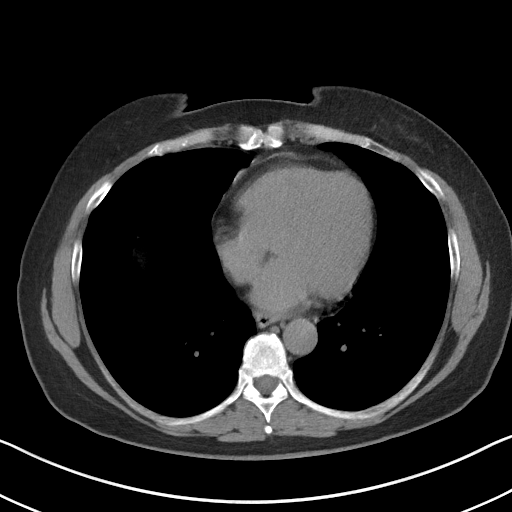

[Series 5: coronal · coronal · 0.90mm/px · 3 of 124 slices shown]
[im 42/124  soft-tissue]
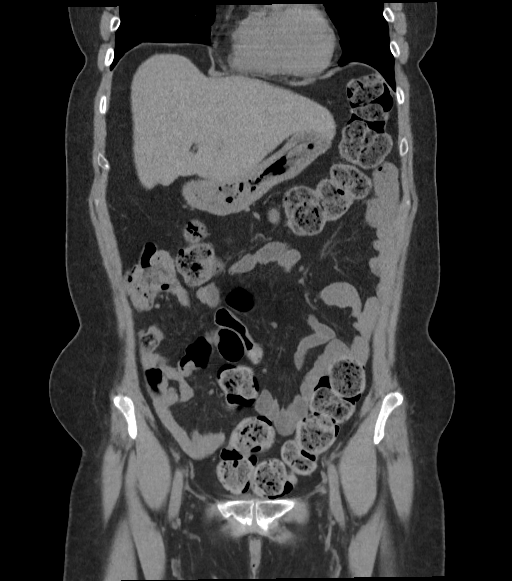
[im 55/124  soft-tissue]
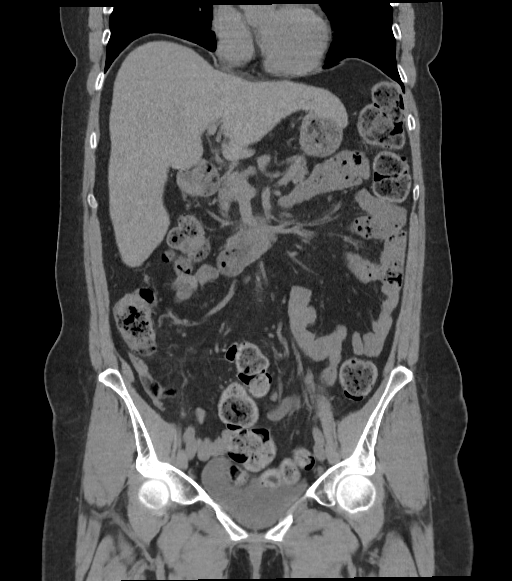
[im 69/124  soft-tissue]
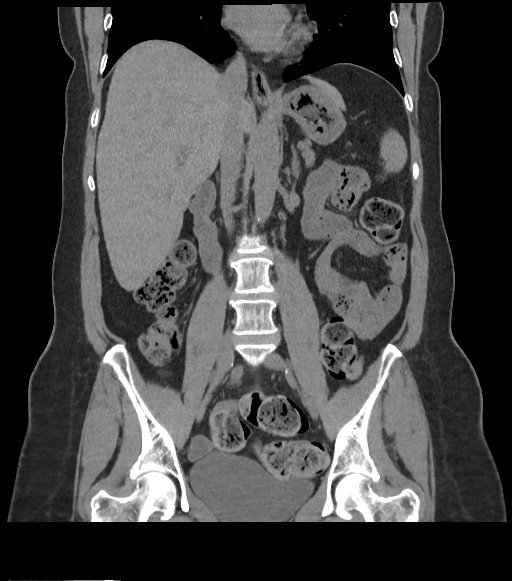

[16 of 46 positions shown; findings below may reference images not displayed]

FINDINGS: Lower chest: No acute abnormality.

Hepatobiliary: No focal liver abnormality is seen. Status post
cholecystectomy. No biliary dilatation.

Pancreas: Unremarkable. No pancreatic ductal dilatation or
surrounding inflammatory changes.

Spleen: Normal in size without focal abnormality.

Adrenals/Urinary Tract: Adrenal glands are unremarkable. Kidneys are
normal, without renal calculi, focal lesion, or hydronephrosis.
Bladder is unremarkable.

Stomach/Bowel: Stomach is within normal limits. Appendix appears
normal (best seen on coronal reformatted images 32 through 40, CT
series 5). Stool is seen throughout the large bowel. No evidence of
bowel wall thickening, distention, or inflammatory changes.

Vascular/Lymphatic: Aortic atherosclerosis. No enlarged abdominal or
pelvic lymph nodes.

Reproductive: Status post hysterectomy. No adnexal masses.

Other: No abdominal wall hernia or abnormality. Subcentimeter,
benign-appearing soft tissue calcifications are again seen adjacent
to the anterior aspect of the right psoas muscle. No abdominopelvic
ascites.

Musculoskeletal: No acute osseous abnormalities are identified. A
stable hemangioma is suspected at the level of the L2 vertebral body
(axial CT image 42, CT series 2).
IMPRESSION: 1. Evidence of prior cholecystectomy and hysterectomy.
2. Large stool burden without evidence of acute or active process
within the abdomen or pelvis.
3. Aortic atherosclerosis.

Aortic Atherosclerosis ([id number]).

## 2022-03-01 ENCOUNTER — Other Ambulatory Visit: Payer: Self-pay

## 2022-03-01 DIAGNOSIS — E119 Type 2 diabetes mellitus without complications: Secondary | ICD-10-CM

## 2022-03-15 ENCOUNTER — Ambulatory Visit: Admit: 2022-03-15 | Discharge: 2022-03-16 | Payer: PRIVATE HEALTH INSURANCE

## 2022-03-15 MED ORDER — VICTOZA 2-PAK 0.6 MG/0.1 ML (18 MG/3 ML) SUBCUTANEOUS PEN INJECTOR
SUBCUTANEOUS | 0 refills | 21 days | Status: CP
Start: 2022-03-15 — End: 2022-04-05

## 2022-04-12 MED ORDER — VICTOZA 3-PAK 0.6 MG/0.1 ML (18 MG/3 ML) SUBCUTANEOUS PEN INJECTOR
Freq: Every day | SUBCUTANEOUS | 11 refills | 30 days | Status: CP
Start: 2022-04-12 — End: 2023-04-12

## 2022-04-21 ENCOUNTER — Ambulatory Visit: Admit: 2022-04-21 | Discharge: 2022-04-23 | Payer: PRIVATE HEALTH INSURANCE

## 2022-04-21 ENCOUNTER — Encounter: Admit: 2022-04-21 | Discharge: 2022-04-23 | Payer: PRIVATE HEALTH INSURANCE

## 2022-04-23 MED ORDER — OZEMPIC 2 MG/DOSE (8 MG/3 ML) SUBCUTANEOUS PEN INJECTOR
SUBCUTANEOUS | 0 refills | 0 days
Start: 2022-04-23 — End: 2022-04-23

## 2022-04-23 MED ORDER — INSULIN GLARGINE (U-100) 100 UNIT/ML SUBCUTANEOUS SOLUTION
Freq: Every evening | SUBCUTANEOUS | 0 refills | 30 days | Status: CP
Start: 2022-04-23 — End: ?

## 2022-04-23 MED ORDER — ATORVASTATIN 40 MG TABLET
ORAL_TABLET | Freq: Every evening | ORAL | 0 refills | 30 days | Status: CP
Start: 2022-04-23 — End: ?

## 2022-04-23 MED ORDER — INSULIN LISPRO (U-100) 100 UNIT/ML SUBCUTANEOUS SOLUTION
Freq: Three times a day (TID) | SUBCUTANEOUS | 0 refills | 45 days | Status: CP
Start: 2022-04-23 — End: 2022-05-23

## 2022-04-23 MED ORDER — IPRATROPIUM BROMIDE 17 MCG/ACTUATION HFA AEROSOL INHALER
Freq: Four times a day (QID) | RESPIRATORY_TRACT | 0 refills | 0 days | Status: CP
Start: 2022-04-23 — End: ?

## 2022-04-23 MED ORDER — CEPHALEXIN 500 MG CAPSULE
ORAL_CAPSULE | Freq: Two times a day (BID) | ORAL | 0 refills | 5 days | Status: CP
Start: 2022-04-23 — End: 2022-04-28

## 2022-04-24 DIAGNOSIS — M62838 Other muscle spasm: Principal | ICD-10-CM

## 2022-04-24 MED ORDER — ASPIRIN 81 MG CHEWABLE TABLET
ORAL_TABLET | Freq: Every day | ORAL | 11 refills | 30 days | Status: CP
Start: 2022-04-24 — End: ?

## 2022-04-26 ENCOUNTER — Telehealth (INDEPENDENT_AMBULATORY_CARE_PROVIDER_SITE_OTHER): Payer: Self-pay

## 2022-04-26 MED ORDER — CYCLOBENZAPRINE 10 MG TABLET
ORAL_TABLET | Freq: Three times a day (TID) | ORAL | 3 refills | 30 days | Status: CP | PRN
Start: 2022-04-26 — End: ?

## 2022-04-26 MED ORDER — INSULIN ASPART (U-100) 100 UNIT/ML (3 ML) SUBCUTANEOUS PEN
1 refills | 0 days | Status: CP
Start: 2022-04-26 — End: ?

## 2022-04-26 NOTE — Transitions of Care (Post Inpatient/ED Visit) (Signed)
   04/26/2022  Name: Whitney Thornton MRN: [id number] DOB: [birth year]  Today's TOC FU Call Status: Today's TOC FU Call Status:: Unsuccessul Call (1st Attempt) Unsuccessful Call (1st Attempt) Date: 04/26/22  Attempted to reach the patient regarding the most recent Inpatient/ED visit.  Follow Up Plan: Additional outreach attempts will be made to reach the patient to complete the Transitions of Care (Post Inpatient/ED visit) call.   Agnes Lawrence, CMA (AAMA)  [facility] [phone]

## 2022-04-27 MED ORDER — LANTUS SOLOSTAR U-100 INSULIN 100 UNIT/ML (3 ML) SUBCUTANEOUS PEN
Freq: Every evening | SUBCUTANEOUS | 2 refills | 20 days | Status: CP
Start: 2022-04-27 — End: ?

## 2022-05-13 NOTE — Transitions of Care (Post Inpatient/ED Visit) (Signed)
   05/13/2022  Name: Shuntia Exton MRN: [id number] DOB: 10-16-1965  Today's TOC FU Call Status: Today's TOC FU Call Status:: Unsuccessul Call (1st Attempt) Unsuccessful Call (1st Attempt) Date: 04/26/22  Attempted to reach the patient regarding the most recent Inpatient/ED visit.  Follow Up Plan: No further outreach attempts will be made at this time. We have been unable to contact the patient.  Signature Agnes Lawrence, CMA (AAMA)  [facility] [phone]

## 2022-05-24 ENCOUNTER — Other Ambulatory Visit: Payer: Self-pay | Admitting: Surgical

## 2022-05-24 ENCOUNTER — Other Ambulatory Visit: Payer: Self-pay | Admitting: Endocrinology

## 2022-05-24 DIAGNOSIS — E119 Type 2 diabetes mellitus without complications: Secondary | ICD-10-CM

## 2022-05-24 MED ORDER — EZETIMIBE 10 MG TABLET
ORAL_TABLET | Freq: Every day | ORAL | 3 refills | 90 days | Status: CP
Start: 2022-05-24 — End: ?

## 2022-06-03 ENCOUNTER — Emergency Department
Admit: 2022-06-03 | Discharge: 2022-06-03 | Disposition: A | Discharge status: Home / Self Care | Payer: PRIVATE HEALTH INSURANCE | Attending: Emergency Medicine

## 2022-06-03 ENCOUNTER — Ambulatory Visit
Admit: 2022-06-03 | Discharge: 2022-06-03 | Disposition: A | Discharge status: Home / Self Care | Payer: PRIVATE HEALTH INSURANCE | Attending: Emergency Medicine

## 2022-06-03 DIAGNOSIS — R079 Chest pain, unspecified: Principal | ICD-10-CM

## 2022-06-04 DIAGNOSIS — R0609 Other forms of dyspnea: Principal | ICD-10-CM

## 2022-06-17 ENCOUNTER — Ambulatory Visit: Admit: 2022-06-17 | Discharge: 2022-06-18 | Payer: PRIVATE HEALTH INSURANCE

## 2022-06-17 DIAGNOSIS — E119 Type 2 diabetes mellitus without complications: Principal | ICD-10-CM

## 2022-06-17 DIAGNOSIS — Z794 Long term (current) use of insulin: Principal | ICD-10-CM

## 2022-06-17 MED ORDER — LANTUS SOLOSTAR U-100 INSULIN 100 UNIT/ML (3 ML) SUBCUTANEOUS PEN
Freq: Two times a day (BID) | SUBCUTANEOUS | 3 refills | 90 days | Status: CP
Start: 2022-06-17 — End: 2023-06-12

## 2022-06-17 MED ORDER — MOUNJARO 2.5 MG/0.5 ML SUBCUTANEOUS PEN INJECTOR
SUBCUTANEOUS | 0 refills | 28 days | Status: CP
Start: 2022-06-17 — End: 2022-07-09

## 2022-06-18 DIAGNOSIS — Z794 Long term (current) use of insulin: Principal | ICD-10-CM

## 2022-06-18 DIAGNOSIS — E119 Type 2 diabetes mellitus without complications: Principal | ICD-10-CM

## 2022-06-20 ENCOUNTER — Ambulatory Visit
Admit: 2022-06-20 | Discharge: 2022-06-20 | Disposition: A | Discharge status: Home / Self Care | Payer: PRIVATE HEALTH INSURANCE | Attending: Family

## 2022-06-20 ENCOUNTER — Emergency Department
Admit: 2022-06-20 | Discharge: 2022-06-20 | Disposition: A | Discharge status: Home / Self Care | Payer: PRIVATE HEALTH INSURANCE | Attending: Family

## 2022-06-20 DIAGNOSIS — S8992XA Unspecified injury of left lower leg, initial encounter: Principal | ICD-10-CM

## 2022-06-20 MED ORDER — OXYCODONE 5 MG TABLET
ORAL_TABLET | ORAL | 0 refills | 2 days | Status: CP | PRN
Start: 2022-06-20 — End: 2022-06-25

## 2022-06-21 MED ORDER — MOUNJARO 2.5 MG/0.5 ML SUBCUTANEOUS PEN INJECTOR
SUBCUTANEOUS | 0 refills | 28 days | Status: CP
Start: 2022-06-21 — End: 2022-07-13

## 2022-06-29 ENCOUNTER — Ambulatory Visit: Admit: 2022-06-29 | Discharge: 2022-06-30 | Payer: PRIVATE HEALTH INSURANCE | Attending: Family | Primary: Family

## 2022-06-29 MED ORDER — MOBIC 7.5 MG TABLET
ORAL_TABLET | 1 refills | 0 days | Status: CP
Start: 2022-06-29 — End: ?

## 2022-07-02 ENCOUNTER — Ambulatory Visit: Admit: 2022-07-02 | Discharge: 2022-07-03 | Payer: PRIVATE HEALTH INSURANCE

## 2022-07-02 MED ORDER — IPRATROPIUM BROMIDE 17 MCG/ACTUATION HFA AEROSOL INHALER
Freq: Four times a day (QID) | RESPIRATORY_TRACT | 0 refills | 0 days | Status: CP
Start: 2022-07-02 — End: ?

## 2022-07-05 MED ORDER — MELOXICAM 7.5 MG TABLET
ORAL_TABLET | 1 refills | 0 days | Status: CP
Start: 2022-07-05 — End: ?

## 2022-07-06 ENCOUNTER — Other Ambulatory Visit: Payer: Self-pay | Admitting: Endocrinology

## 2022-07-06 DIAGNOSIS — E119 Type 2 diabetes mellitus without complications: Secondary | ICD-10-CM

## 2022-07-06 DIAGNOSIS — Z794 Long term (current) use of insulin: Principal | ICD-10-CM

## 2022-07-07 MED ORDER — MOUNJARO 5 MG/0.5 ML SUBCUTANEOUS PEN INJECTOR
11 refills | 0 days | Status: CP
Start: 2022-07-07 — End: ?

## 2022-07-09 ENCOUNTER — Ambulatory Visit: Admit: 2022-07-09 | Discharge: 2022-07-10 | Payer: PRIVATE HEALTH INSURANCE

## 2022-07-09 MED ORDER — MOUNJARO 2.5 MG/0.5 ML SUBCUTANEOUS PEN INJECTOR
SUBCUTANEOUS | 0 refills | 28 days | Status: CP
Start: 2022-07-09 — End: 2022-07-31

## 2022-07-13 DIAGNOSIS — S8992XA Unspecified injury of left lower leg, initial encounter: Principal | ICD-10-CM

## 2022-07-14 ENCOUNTER — Ambulatory Visit
Admit: 2022-07-14 | Discharge: 2022-07-15 | Payer: PRIVATE HEALTH INSURANCE | Attending: Student in an Organized Health Care Education/Training Program | Primary: Student in an Organized Health Care Education/Training Program

## 2022-07-14 DIAGNOSIS — K5904 Chronic idiopathic constipation: Principal | ICD-10-CM

## 2022-07-14 MED ORDER — ATORVASTATIN 40 MG TABLET
ORAL_TABLET | Freq: Every evening | ORAL | 3 refills | 90 days | Status: CP
Start: 2022-07-14 — End: ?

## 2022-07-14 NOTE — Unmapped (Signed)
Pulmonary Clinic - Initial Visit    Referring Physician :  Derrill Jennings  PCP:     Kelli Crigler, MD    HISTORY:     History of Present Illness:  Kelli Jennings is a 57 y.o.  with the below medical issues who I am seeing for evaluation of dyspnea    Per chart review patient presented with atypical chest pain and dyspnea on exertion concerning for unstable angina to [facility] in 04/2022. Workup at that time notable for negative troponin and normal PET stress despite several cardiovascular risk factors. She was referred to pulmonary for further workup.    Today  - she reports that going up stairs she would feel more short of breath, progressed to the point that she could not walk down the hall  - this led her to present to the [facility] spring 2024  - prior to that she is quite active and works as a Copywriter, advertising for [employer]  - as above got an extensive cardiac workup while inpatient - all reassuring  - reports she did get relief from the nitroglycerin that she got in the [facility]  - has been feeling better since discharge using atrovent HFA - she is taking it once per day  - reports she has a lot of mucus  - changed the way that she took her thyroid - now taking it before food  - stopped her glipizide which is the other change  - no cough or wheeze, no palpitations, no syncopized   - never happens at rest, always with exertion   - feels reasonably well in the AM, doesn't snore, no problems staying awake  - does also report a sense of generalized fatigue at times (ie hard to get up out of bed)    Past Medical History: The medical and surgical history were personally reviewed and updated in the patient's electronic medical record. Pertinent positives are documented above.    #HLD  #T2DM  #MDD  #hypothyroid  #constipation    Social History  - Smoking: never smoker  - Vaping: never  - Chewing: never  - Other ilicits: never  - Alcohol use: socially  - Occupation: Delivered   - Exposures:   [No  ] Medications - amiodarone, bleomycin, cyclophosphamide, methotrexate, nitrofurantoin, penicillamine, other chemotherapy  [No  ] Asbestos  [No  ] Beryllium, glass cutting, mining activities, silica (sandblasting), woodworking  [No  ] Farm work or mushroom farming  Kelli Jennings  ] Pet birds/raise birds or down bedding  [No  ] Mold - mold damage at home or home ever flooded  Kelli Jennings  ] Hot tub with standing water or sauna exposure/use  - Travel History:    - Goes on ocassional cruises to     Kelli Jennings:   -  Home Medications: Medications were reviewed and updated in the patient's electronic medical record. Pertinent positives are documented above.  Allergies: Allergies were reviewed and updated in the patient's electronic medical record. Pertinent positives are documented above.  Review of Systems: A comprehensive review of systems was completed and negative except as noted in HPI.    PHYSICAL EXAM:     BP 116/73 (BP Site: L Arm, BP Position: Sitting, BP Cuff Size: Medium)  - Pulse 77  - Temp 36.1 ??C (97 ??F) (Temporal)  - Wt 82.1 kg (181 lb)  - SpO2 95%  - BMI 29.21 kg/m??     Comfortable  Conversant  No acute distress  Breathing easily on room air  RRR  No m/r/g  Lungs CTAB  No LEE    LABORATORY and RADIOLOGY DATA:      I have personally reviewed all of the below data in the chart and pulled from extra-chart sources (CareEverywhere, Media tab)     Pulmonary Function Tests/Interpretation:    Date:  FVC (% Pred)  Pre FEV1 (%Pred)   Post FEV1 (%Pred)   FEV1/FVC  FEF25-75(% Pred)  DLCO (% Pred)     06/2022 3.11 2.68 2.78      15.9 - 77%                                        :      Pertinent Laboratory Data:  05/2022   - TSH wnl - previously high  - Hgb wnl  - Cr wnl  - chronically elevated ALT, Alk phos  - A1c 12.9    Pertinent Imaging Data:  05/2022 CXR wnl  CTA Chest 11/2020 - reportedly normal    Pertinent Cardiac Data:  04/2022 PET CT  - Normal myocardial perfusion study  - No evidence of any significant ischemia or scar  - Left ventricular systolic function is normal. Post stress the ejection fraction is > 60%.  - Mild coronary calcifications are noted  - Surgically absent gallbladder.    04/2022 TTE  Summary    1. The left ventricle is normal in size with normal wall thickness.    2. The left ventricular systolic function is normal, LVEF is visually  estimated at > 55%.    3. The right ventricle is normal in size, with normal systolic function.    4. There are no significant valvular abnormalities.      ASSESSMENT and PLAN     Problem List    #dyspnea on exertion    Assessment/Plan    #dyspnea on exertion  Kelli Jennings presents with subacute worsening dyspnea on exertion and intermittent generalized fatigue. Her cardiopulmonary evaluation is very reassuring. From a pulmonary perspective, she carries no particular symptom (cough, wheeze, etc), exposure history (smoking, etc), imaging finding, or PFT abnormality that suggest a pulmonary cause to her symptoms. I feel her symptoms - though atypically described - could be consistent with her uncontrolled diabetes (A1c 13) +/- contribution of hypothyroidism. This is particularly true if a generalized sense of fatigue or dehydration (orthostasis, palpitations) are prominent features of her presentation - though I could only intermittently elicit those on ROS. Regardless, she is feeling better than this Spring and as above there is no clear obvious pulmonary pathology to target, we will make no changes. I told her that if she continues to feel good she can stop the Atrovent.    The patient was seen with Dr. Nada Libman and will return to clinic  if needed.     Kelli Jennings, Kelli Jennings, Kelli Abbe, MD    Jessica Priest, MD, MBA  Pulmonary and Critical Care Fellow

## 2022-07-15 MED ORDER — MOUNJARO 5 MG/0.5 ML SUBCUTANEOUS PEN INJECTOR
SUBCUTANEOUS | 0 refills | 0 days | Status: CP
Start: 2022-07-15 — End: 2022-08-06

## 2022-07-15 NOTE — Unmapped (Signed)
[facility] Specialty Medication Onboarding    Specialty Medication: MOUNJARO 2.5 mg/0.5 mL Pnij (tirzepatide)  Prior Authorization: Not Required   Financial Assistance: No - copay  <$25  Final Copay/Day Supply: $0 / 28    Insurance Restrictions: None     Notes to Pharmacist: new to therapy  Credit Card on File: no    The triage team has completed the benefits investigation and has determined that the patient is able to fill this medication at [facility]. Please contact the patient to complete the onboarding or follow up with the prescribing physician as needed.

## 2022-07-16 NOTE — Unmapped (Signed)
[facility] has reached out to this patient via MyChart to onboard them to our Specialty Lite services for their Mounjaro. They will now receive proactive outreach from the pharmacy team for refills.    Austin Pongratz C Tanysha Quant, PharmD  [facility] Pharmacist

## 2022-07-26 ENCOUNTER — Ambulatory Visit: Payer: 59 | Admitting: Surgical

## 2022-08-01 ENCOUNTER — Other Ambulatory Visit: Payer: Self-pay | Admitting: Endocrinology

## 2022-08-01 DIAGNOSIS — E039 Hypothyroidism, unspecified: Secondary | ICD-10-CM

## 2022-08-03 ENCOUNTER — Ambulatory Visit: Payer: 59 | Admitting: Surgical

## 2022-08-04 ENCOUNTER — Ambulatory Visit: Payer: 59 | Admitting: Surgical

## 2022-08-08 ENCOUNTER — Other Ambulatory Visit: Payer: Self-pay | Admitting: Surgical

## 2022-08-08 ENCOUNTER — Other Ambulatory Visit: Payer: Self-pay | Admitting: Endocrinology

## 2022-08-08 DIAGNOSIS — E119 Type 2 diabetes mellitus without complications: Secondary | ICD-10-CM

## 2022-08-08 MED ORDER — ATROVENT HFA 17 MCG/ACTUATION AEROSOL INHALER
0 refills | 0 days
Start: 2022-08-08 — End: ?

## 2022-08-10 MED ORDER — ATROVENT HFA 17 MCG/ACTUATION AEROSOL INHALER
0 refills | 0 days | Status: CP
Start: 2022-08-10 — End: ?

## 2022-08-12 MED ORDER — MOUNJARO 7.5 MG/0.5 ML SUBCUTANEOUS PEN INJECTOR
SUBCUTANEOUS | 0 refills | 0 days | Status: CP
Start: 2022-08-12 — End: 2022-09-03

## 2022-08-19 MED ORDER — MELOXICAM 7.5 MG TABLET
ORAL_TABLET | 1 refills | 0 days
Start: 2022-08-19 — End: ?

## 2022-08-20 ENCOUNTER — Other Ambulatory Visit: Payer: Self-pay

## 2022-08-20 DIAGNOSIS — E039 Hypothyroidism, unspecified: Secondary | ICD-10-CM

## 2022-08-20 MED ORDER — MELOXICAM 7.5 MG TABLET
ORAL_TABLET | 1 refills | 0 days | Status: CP
Start: 2022-08-20 — End: ?

## 2022-08-23 DIAGNOSIS — Z794 Long term (current) use of insulin: Principal | ICD-10-CM

## 2022-08-23 DIAGNOSIS — E119 Type 2 diabetes mellitus without complications: Principal | ICD-10-CM

## 2022-08-23 MED ORDER — MOUNJARO 2.5 MG/0.5 ML SUBCUTANEOUS PEN INJECTOR
SUBCUTANEOUS | 0 refills | 28 days | Status: CP
Start: 2022-08-23 — End: 2022-09-14

## 2022-09-15 ENCOUNTER — Ambulatory Visit: Payer: 59 | Admitting: Surgical

## 2022-09-26 MED ORDER — ATROVENT HFA 17 MCG/ACTUATION AEROSOL INHALER
0 refills | 0 days
Start: 2022-09-26 — End: ?

## 2022-09-27 MED ORDER — ATROVENT HFA 17 MCG/ACTUATION AEROSOL INHALER
0 refills | 0 days | Status: CP
Start: 2022-09-27 — End: ?

## 2022-09-28 ENCOUNTER — Ambulatory Visit: Admit: 2022-09-28 | Discharge: 2022-09-29 | Payer: PRIVATE HEALTH INSURANCE

## 2022-09-28 LAB — ALBUMIN / CREATININE URINE RATIO
ALBUMIN QUANT URINE: 14.6 mg/dL
ALBUMIN/CREATININE RATIO: 61.9 ug/mg — ABNORMAL HIGH (ref 0.0–30.0)
CREATININE, URINE: 236 mg/dL

## 2022-09-28 LAB — URINALYSIS WITH MICROSCOPY WITH CULTURE REFLEX PERFORMABLE
BILIRUBIN UA: NEGATIVE
NITRITE UA: POSITIVE — AB
PH UA: 6 (ref 5.0–9.0)
PROTEIN UA: 50 — AB
RBC UA: 4 /HPF (ref <=4)
SPECIFIC GRAVITY UA: 1.022 (ref 1.003–1.030)
SQUAMOUS EPITHELIAL: 5 /HPF (ref 0–5)
UROBILINOGEN UA: 12 — AB
WBC UA: >182 /HPF — ABNORMAL HIGH (ref 0–5)

## 2022-09-28 MED ORDER — ONDANSETRON 4 MG DISINTEGRATING TABLET
ORAL_TABLET | Freq: Three times a day (TID) | 1 refills | 10 days | Status: CP | PRN
Start: 2022-09-28 — End: 2022-10-28

## 2022-09-28 MED ORDER — MOUNJARO 5 MG/0.5 ML SUBCUTANEOUS PEN INJECTOR
SUBCUTANEOUS | 0 refills | 0 days | Status: CP
Start: 2022-09-28 — End: 2022-10-20

## 2022-09-28 MED ORDER — PYRIDOXINE (VITAMIN B6) 25 MG TABLET
ORAL_TABLET | Freq: Every day | ORAL | 3 refills | 90 days | Status: CP
Start: 2022-09-28 — End: ?

## 2022-09-28 MED ORDER — NITROFURANTOIN MONOHYDRATE/MACROCRYSTALS 100 MG CAPSULE
ORAL_CAPSULE | Freq: Two times a day (BID) | ORAL | 0 refills | 5 days | Status: CP
Start: 2022-09-28 — End: 2022-10-03

## 2022-09-28 NOTE — Unmapped (Signed)
Sx stable, no SI/HI. Pt reports mood is doing well, thinks she feels much better on Elavil 50 mg nightly for interstitial cystitis and depression overlap.

## 2022-09-28 NOTE — Unmapped (Signed)
Continue drinking LOTS of water to keep yourself hydrated and maintain your blood pressure at a normal level  Pick up the zofran for nausea, and also see if they will dispense the Vitamin B6 (pyridoxine) or if you need to get it over the counter (should be inexpensive, 25 mg daily is enough)  Please call for your mammogram: You may call the [facility] center at [phone] to schedule your appointment.  If your sugars are on the lower end with Lantus 80 units twice daily, you should drop to 40 units twice daily or even 40 units ONCE daily, but do not stop it completely for more than 1 day (e.g. if your blood sugar is 80 or lower)

## 2022-09-28 NOTE — Unmapped (Addendum)
Pt with poorly controlled DM, followed by endocrine. Last A1C 11. Unfortunately not enough blood collected to run A1C today. Continues on Metformin 1000 mg BID (but sometimes only takes in PM), stopped glipizide 10 mg daily (pt reports stopped because allergic). Also taking Mounjaro 2.5 mg dose, NOT taking insulin Lantus at all for last few days, but had been taking 80 mg BID prior to last few days. Not taking mealtime insulin lispro. Started Mounjaro 3 weeks ago (Takes on Saturdays) but now is getting very nauseous. Pt does not want to switch to Trulicity for better tolerance, opts to try anti-nausea medicine. Counseled on reducing insulin Lantus if BS low again (had symptomatic BS 90). Pt has follow up with endocrine in 3 weeks.    // Diabetes Type 2: uncontrolled  - Recent Labs:   Lab Results   Component Value Date    HGB A1C, POC 11.0 (H) 09/28/2021    Hemoglobin A1C 12.9 (H) 04/21/2022    Hemoglobin A1C 8.9 (H) 03/06/2021    LDL Calculated 137 (H) 04/21/2022      - The 10-year ASCVD risk score (Arnett DK, et al., 2019) is: 3.6% on ASA/Statin Prevention Therapy Yes.  - Treated with: Mounjaro 2.5 mg weekly, titrating up as able. Metformin 1000 mg BID, Lantus 80 mg BID though counseled on possibly dropping that iso low blood sugars intermittently  - Plan Discussed general issues about diabetes pathophysiology and management.

## 2022-09-28 NOTE — Unmapped (Addendum)
Pt with ongoing dysuria, has long history of dysuria and interstitial cystitis. UA today suspicious for UTI with many WBC and +LE/N. Has historically grown organisms susceptible to macrobid, will send today. Suspect some of dysuria is also in setting of significant glucosuria with poorly controlled DM. Has upcoming appt with endocrine. Working on glucose control as per problem. Counseling provided today on symptomatic relief, hydration (pt reports drinking a lot though urine appears very concentrated), dietary changes to reduce interstitial cystitis. Sending to urogynecology.  - Macrobid BID x5 days  - Continue elavil to 50 mg nightly  - f/up urogynecology referral  - consider re-referral to pelvic floor PT

## 2022-09-28 NOTE — Unmapped (Signed)
I reviewed with the resident the medical history and the resident's findings on physical examination. I discussed with the resident the patient's diagnoses and concur with the treatment plan as documented in the resident note.

## 2022-09-28 NOTE — Unmapped (Signed)
Addended by: Luther Hearing on: 09/28/2022 12:48 PM     Modules accepted: Orders

## 2022-09-28 NOTE — Unmapped (Addendum)
[facility]  Established Patient Clinic Note    Assessment/Plan:   Ms. Duvernay is a 57 y.o.female who  has a past medical history of Diabetes mellitus (CMS-HCC), Disease of thyroid gland, High cholesterol, Interstitial cystitis (03/20/2021), Pericardial effusion (02/18/2020), Personal history of COVID-19 (02/18/2020), Recurrent pyelonephritis, and SOB (shortness of breath) (02/18/2020). coming in for the following issues:    Diagnoses and all orders for this visit:    Type 2 diabetes mellitus without complication, with long-term current use of insulin (CMS-HCC)  Assessment & Plan:  Pt with poorly controlled DM, followed by endocrine. Last A1C 11. Unfortunately not enough blood collected to run A1C today. Continues on Metformin 1000 mg BID (but sometimes only takes in PM), stopped glipizide 10 mg daily (pt reports stopped because allergic). Also taking Mounjaro 2.5 mg dose, NOT taking insulin Lantus at all for last few days, but had been taking 80 mg BID prior to last few days. Not taking mealtime insulin lispro. Started Mounjaro 3 weeks ago (Takes on Saturdays) but now is getting very nauseous. Pt does not want to switch to Trulicity for better tolerance, opts to try anti-nausea medicine. Counseled on reducing insulin Lantus if BS low again (had symptomatic BS 90). Pt has follow up with endocrine in 3 weeks.    // Diabetes Type 2: uncontrolled  - Recent Labs:   Lab Results   Component Value Date    HGB A1C, POC 11.0 (H) 09/28/2021    Hemoglobin A1C 12.9 (H) 04/21/2022    Hemoglobin A1C 8.9 (H) 03/06/2021    LDL Calculated 137 (H) 04/21/2022      - The 10-year ASCVD risk score (Arnett DK, et al., 2019) is: 3.6% on ASA/Statin Prevention Therapy Yes.  - Treated with: Mounjaro 2.5 mg weekly, titrating up as able. Metformin 1000 mg BID, Lantus 80 mg BID though counseled on possibly dropping that iso low blood sugars intermittently  - Plan Discussed general issues about diabetes pathophysiology and management.      Orders:  -     ondansetron (ZOFRAN-ODT) 4 MG disintegrating tablet; Dissolve 1 tablet (4 mg total) in the mouth every eight (8) hours as needed for nausea.  -     pyridoxine, vitamin B6, (B-6) 25 MG tablet; Take 1 tablet (25 mg total) by mouth daily.  -     Urinalysis with Microscopy with Culture Reflex; Future  -     Albumin/creatinine urine ratio; Future  -     Urine Culture    Pain due to interstitial cystitis  Assessment & Plan:  Pt with ongoing dysuria, has long history of dysuria and interstitial cystitis. UA today suspicious for UTI with many WBC and +LE/N. Has historically grown organisms susceptible to macrobid, will send today. Suspect some of dysuria is also in setting of significant glucosuria with poorly controlled DM. Has upcoming appt with endocrine. Working on glucose control as per problem. Counseling provided today on symptomatic relief, hydration (pt reports drinking a lot though urine appears very concentrated), dietary changes to reduce interstitial cystitis. Sending to urogynecology.  - Macrobid BID x5 days  - Continue elavil to 50 mg nightly  - f/up urogynecology referral  - consider re-referral to pelvic floor PT    Orders:  -     Urinalysis with Microscopy with Culture Reflex; Future  -     Urine Culture    Current moderate episode of major depressive disorder, unspecified whether recurrent (CMS-HCC)  Assessment & Plan:  Sx  stable, no SI/HI. Pt reports mood is doing well, thinks she feels much better on Elavil 50 mg nightly for interstitial cystitis and depression overlap.      Encounter for screening mammogram for breast cancer  -     Mammography screening bilateral; Future    Acute cystitis without hematuria       PHQ-9 Score: 12      Screening complete, depression identified / today's follow-up action documented in note    Luther Hearing MD MPH  Family Medicine PGY3    Attending: Dr. Lanell Matar    Subjective   Ms. Poznanski is a 57 y.o. female coming to clinic today for the following issues:    Chief Complaint   Patient presents with    Follow-up     Has been nauseas.       HPI:     # DM    # Urine      I have reviewed the problem list, medications, and allergies and have updated/reconciled them if needed.    Ms. Saravia  reports that she has never smoked. She has never used smokeless tobacco.  Health Maintenance   Topic Date Due    Retinal Eye Exam  Never done    Colon Cancer Screening  Never done    Zoster Vaccines (1 of 2) Never done    Pneumococcal Vaccine 0-64 (2 of 2 - PCV) 06/10/2018    Mammogram Start Age 29  04/11/2021    Pap Smear (21-65)  03/29/2022    Hemoglobin A1c  07/21/2022    COVID-19 Vaccine (3 - 2023-24 season) 09/19/2022    Influenza Vaccine (1) 09/19/2022    Foot Exam  03/16/2023    Serum Creatinine Monitoring  06/03/2023    Potassium Monitoring  06/03/2023    Urine Albumin/Creatinine Ratio  09/28/2023    DTaP/Tdap/Td Vaccines (2 - Td or Tdap) 06/10/2027    Hepatitis C Screen  Completed       Objective     VITALS: BP 87/59 (BP Site: L Arm, BP Position: Sitting, BP Cuff Size: Medium)  - Pulse 94  - Wt 80.6 kg (177 lb 12.8 oz)  - BMI 28.70 kg/m??     Physical Exam  GEN: well appearing, appears stated age, NAD   HEENT: NCAT, No scleral icterus. MMM. EOMI. Poor dentition.  CV: Clinically well perfused. RRR, no MRG.  Pulm: Normal work of breathing ORA. Lungs CTAB. No wheezes, rales, ronchi.  Abd: Flat, soft, nontender, nondistended.  Neuro: A&O x 3. No focal deficits.  Ext: Warm. Moves all extremities spontaneously.  Skin: No concerning rashes or skin lesions grossly observable on exposed skin.  Psych: Anxious but otherwise appropriate affect.      LABS/IMAGING  I have reviewed pertinent recent labs and imaging in Epic    [facility]  [place] [facility] [place] [facility]  [id number] [place], [place] [address]  Telephone [phone]  Fax [phone]  [email or web address]

## 2022-09-28 NOTE — Unmapped (Deleted)
Pt with ongoing dysuria, has long history of dysuria and interstitial cystitis. Do not suspect true UTI though will send for UA and reflex culture today. Suspect dysuria in setting of significant glucosuria with poorly controlled DM. Has upcoming appt with endocrine.

## 2022-09-30 NOTE — Unmapped (Signed)
Specialty Medication(s): [facility]    Ms.Reller has been dis-enrolled from the [facility] specialty pharmacy services due to a pharmacy change. The patient is now filling at [facility] .    Additional information provided to the patient: n/a    Arnold Long, PharmD  [facility] Specialty Pharmacist

## 2022-10-06 MED ORDER — CEPHALEXIN 500 MG CAPSULE
ORAL_CAPSULE | Freq: Two times a day (BID) | ORAL | 0 refills | 5 days | Status: CP
Start: 2022-10-06 — End: 2022-10-11

## 2022-10-23 MED ORDER — ATROVENT HFA 17 MCG/ACTUATION AEROSOL INHALER
0 refills | 0 days
Start: 2022-10-23 — End: ?

## 2022-10-25 MED ORDER — ATROVENT HFA 17 MCG/ACTUATION AEROSOL INHALER
0 refills | 0 days | Status: CP
Start: 2022-10-25 — End: ?

## 2022-10-26 DIAGNOSIS — N3010 Interstitial cystitis (chronic) without hematuria: Principal | ICD-10-CM

## 2022-10-26 MED ORDER — AMITRIPTYLINE 25 MG TABLET
ORAL_TABLET | Freq: Every evening | ORAL | 3 refills | 90 days | Status: CP
Start: 2022-10-26 — End: ?

## 2022-10-27 MED ORDER — MOUNJARO 2.5 MG/0.5 ML SUBCUTANEOUS PEN INJECTOR
SUBCUTANEOUS | 11 refills | 28 days | Status: CP
Start: 2022-10-27 — End: ?

## 2022-10-28 ENCOUNTER — Ambulatory Visit: Admit: 2022-10-28 | Discharge: 2022-10-29 | Payer: PRIVATE HEALTH INSURANCE

## 2022-10-28 DIAGNOSIS — E119 Type 2 diabetes mellitus without complications: Principal | ICD-10-CM

## 2022-10-28 DIAGNOSIS — R110 Nausea: Principal | ICD-10-CM

## 2022-10-28 DIAGNOSIS — Z794 Long term (current) use of insulin: Principal | ICD-10-CM

## 2022-10-28 MED ORDER — PROMETHAZINE 25 MG TABLET
ORAL_TABLET | Freq: Four times a day (QID) | ORAL | 2 refills | 8 days | Status: CP | PRN
Start: 2022-10-28 — End: ?

## 2022-11-05 ENCOUNTER — Ambulatory Visit: Admit: 2022-11-05 | Discharge: 2022-11-06 | Payer: PRIVATE HEALTH INSURANCE

## 2022-11-05 MED ORDER — PHENAZOPYRIDINE 100 MG TABLET
ORAL_TABLET | Freq: Three times a day (TID) | ORAL | 0 refills | 7 days | Status: CP | PRN
Start: 2022-11-05 — End: 2023-11-05

## 2022-11-06 DIAGNOSIS — E119 Type 2 diabetes mellitus without complications: Principal | ICD-10-CM

## 2022-11-06 MED ORDER — METFORMIN ER 500 MG TABLET,EXTENDED RELEASE 24 HR
ORAL_TABLET | 3 refills | 0 days
Start: 2022-11-06 — End: ?

## 2022-11-08 MED ORDER — METFORMIN ER 500 MG TABLET,EXTENDED RELEASE 24 HR
ORAL_TABLET | 3 refills | 0 days | Status: CP
Start: 2022-11-08 — End: ?

## 2022-11-10 ENCOUNTER — Ambulatory Visit: Payer: 59 | Admitting: Surgical

## 2022-11-19 ENCOUNTER — Ambulatory Visit: Payer: 59 | Admitting: Surgical

## 2022-12-05 ENCOUNTER — Other Ambulatory Visit (INDEPENDENT_AMBULATORY_CARE_PROVIDER_SITE_OTHER): Payer: Self-pay | Admitting: Endocrinology

## 2022-12-08 DIAGNOSIS — R0609 Other forms of dyspnea: Principal | ICD-10-CM

## 2022-12-08 MED ORDER — ATROVENT HFA 17 MCG/ACTUATION AEROSOL INHALER
0 refills | 0 days
Start: 2022-12-08 — End: ?

## 2022-12-09 MED ORDER — ATROVENT HFA 17 MCG/ACTUATION AEROSOL INHALER
0 refills | 0 days | Status: CP
Start: 2022-12-09 — End: ?

## 2022-12-15 DIAGNOSIS — M62838 Other muscle spasm: Principal | ICD-10-CM

## 2022-12-15 MED ORDER — CYCLOBENZAPRINE 10 MG TABLET
ORAL_TABLET | Freq: Three times a day (TID) | ORAL | 3 refills | 30 days | PRN
Start: 2022-12-15 — End: ?

## 2022-12-17 MED ORDER — PHENAZOPYRIDINE 100 MG TABLET
ORAL_TABLET | Freq: Every day | ORAL | 0 refills | 0 days | Status: CP | PRN
Start: 2022-12-17 — End: ?

## 2023-01-23 DIAGNOSIS — M62838 Other muscle spasm: Principal | ICD-10-CM

## 2023-01-23 MED ORDER — CYCLOBENZAPRINE 10 MG TABLET
ORAL_TABLET | Freq: Three times a day (TID) | ORAL | 3 refills | 0.00 days | PRN
Start: 2023-01-23 — End: ?

## 2023-01-26 MED ORDER — CYCLOBENZAPRINE 10 MG TABLET
ORAL_TABLET | Freq: Three times a day (TID) | ORAL | 3 refills | 30.00 days | Status: CP | PRN
Start: 2023-01-26 — End: ?

## 2023-02-01 MED ORDER — LUBIPROSTONE 24 MCG CAPSULE
ORAL_CAPSULE | 3 refills | 0.00 days
Start: 2023-02-01 — End: ?

## 2023-02-02 MED ORDER — LUBIPROSTONE 24 MCG CAPSULE
ORAL_CAPSULE | 3 refills | 0.00 days
Start: 2023-02-02 — End: ?

## 2023-02-03 MED ORDER — LUBIPROSTONE 24 MCG CAPSULE
ORAL_CAPSULE | 3 refills | 0.00 days
Start: 2023-02-03 — End: ?

## 2023-02-17 ENCOUNTER — Ambulatory Visit: Admit: 2023-02-17 | Payer: PRIVATE HEALTH INSURANCE

## 2023-02-26 MED ORDER — MOUNJARO 2.5 MG/0.5 ML SUBCUTANEOUS PEN INJECTOR
SUBCUTANEOUS | 11 refills | 28.00 days
Start: 2023-02-26 — End: ?

## 2023-03-03 MED ORDER — MOUNJARO 2.5 MG/0.5 ML SUBCUTANEOUS PEN INJECTOR
SUBCUTANEOUS | 11 refills | 28.00 days | Status: CP
Start: 2023-03-03 — End: ?

## 2023-03-07 MED ORDER — MOUNJARO 2.5 MG/0.5 ML SUBCUTANEOUS PEN INJECTOR
SUBCUTANEOUS | 11 refills | 28.00 days
Start: 2023-03-07 — End: ?

## 2023-03-10 MED ORDER — MOUNJARO 2.5 MG/0.5 ML SUBCUTANEOUS PEN INJECTOR
SUBCUTANEOUS | 11 refills | 28.00 days
Start: 2023-03-10 — End: ?

## 2023-03-11 MED ORDER — MOUNJARO 2.5 MG/0.5 ML SUBCUTANEOUS PEN INJECTOR
SUBCUTANEOUS | 11 refills | 28.00 days
Start: 2023-03-11 — End: ?

## 2023-03-14 MED ORDER — MOUNJARO 2.5 MG/0.5 ML SUBCUTANEOUS PEN INJECTOR
SUBCUTANEOUS | 11 refills | 28.00 days | Status: CP
Start: 2023-03-14 — End: ?

## 2023-04-01 ENCOUNTER — Emergency Department
Admit: 2023-04-01 | Discharge: 2023-04-01 | Disposition: A | Discharge status: Home / Self Care | Payer: PRIVATE HEALTH INSURANCE

## 2023-04-01 DIAGNOSIS — G4489 Other headache syndrome: Principal | ICD-10-CM

## 2023-04-01 DIAGNOSIS — H539 Unspecified visual disturbance: Principal | ICD-10-CM

## 2023-04-01 DIAGNOSIS — R739 Hyperglycemia, unspecified: Principal | ICD-10-CM

## 2023-04-01 MED ORDER — MAGNESIUM 300 MG (AS MAGNESIUM OXIDE) TABLET
ORAL_TABLET | Freq: Every day | ORAL | 0 refills | 21.00 days | Status: CP
Start: 2023-04-01 — End: 2023-04-22

## 2023-04-01 MED ORDER — BUTALBITAL-ACETAMINOPHEN-CAFFEINE 50 MG-325 MG-40 MG TABLET
ORAL_TABLET | ORAL | 0 refills | 5.00 days | Status: CP | PRN
Start: 2023-04-01 — End: 2023-04-06

## 2023-04-01 MED ORDER — CEFDINIR 300 MG CAPSULE
ORAL_CAPSULE | Freq: Every day | ORAL | 0 refills | 7.00 days | Status: CP
Start: 2023-04-01 — End: 2023-04-08

## 2023-04-12 ENCOUNTER — Ambulatory Visit: Admit: 2023-04-12 | Discharge: 2023-04-13 | Payer: PRIVATE HEALTH INSURANCE

## 2023-04-12 DIAGNOSIS — H539 Unspecified visual disturbance: Principal | ICD-10-CM

## 2023-05-03 MED ORDER — EZETIMIBE 10 MG TABLET
ORAL_TABLET | Freq: Every day | ORAL | 3 refills | 90.00 days | Status: CP
Start: 2023-05-03 — End: ?

## 2023-05-06 ENCOUNTER — Ambulatory Visit: Admit: 2023-05-06 | Payer: PRIVATE HEALTH INSURANCE

## 2023-05-18 ENCOUNTER — Other Ambulatory Visit: Payer: Self-pay | Admitting: Surgical

## 2023-05-21 DIAGNOSIS — N3010 Interstitial cystitis (chronic) without hematuria: Principal | ICD-10-CM

## 2023-05-21 MED ORDER — AMITRIPTYLINE 25 MG TABLET
ORAL_TABLET | Freq: Every evening | ORAL | 3 refills | 90.00000 days | Status: CP
Start: 2023-05-21 — End: ?

## 2023-05-21 MED ORDER — ATORVASTATIN 40 MG TABLET
ORAL_TABLET | Freq: Every evening | ORAL | 3 refills | 90.00000 days | Status: CP
Start: 2023-05-21 — End: ?

## 2023-05-28 DIAGNOSIS — R300 Dysuria: Principal | ICD-10-CM

## 2023-05-28 DIAGNOSIS — M62838 Other muscle spasm: Principal | ICD-10-CM

## 2023-05-28 MED ORDER — MELOXICAM 7.5 MG TABLET
ORAL_TABLET | 1 refills | 0.00000 days
Start: 2023-05-28 — End: ?

## 2023-05-28 MED ORDER — MOUNJARO 2.5 MG/0.5 ML SUBCUTANEOUS PEN INJECTOR
SUBCUTANEOUS | 11 refills | 28.00000 days
Start: 2023-05-28 — End: ?

## 2023-05-28 MED ORDER — CYCLOBENZAPRINE 10 MG TABLET
ORAL_TABLET | Freq: Three times a day (TID) | ORAL | 3 refills | 30.00000 days | PRN
Start: 2023-05-28 — End: ?

## 2023-05-28 MED ORDER — PHENAZOPYRIDINE 100 MG TABLET
ORAL_TABLET | Freq: Every day | ORAL | 0 refills | 0.00000 days | PRN
Start: 2023-05-28 — End: ?

## 2023-05-30 MED ORDER — MOUNJARO 2.5 MG/0.5 ML SUBCUTANEOUS PEN INJECTOR
SUBCUTANEOUS | 5 refills | 28.00000 days | Status: CP
Start: 2023-05-30 — End: ?

## 2023-05-30 MED ORDER — MELOXICAM 7.5 MG TABLET
ORAL_TABLET | ORAL | 1 refills | 0.00000 days | Status: CP
Start: 2023-05-30 — End: ?

## 2023-06-01 MED ORDER — CYCLOBENZAPRINE 10 MG TABLET
ORAL_TABLET | Freq: Three times a day (TID) | ORAL | 3 refills | 30.00000 days | Status: CP | PRN
Start: 2023-06-01 — End: ?

## 2023-06-01 MED ORDER — PHENAZOPYRIDINE 100 MG TABLET
ORAL_TABLET | Freq: Every day | ORAL | 0 refills | 0.00000 days | Status: CP | PRN
Start: 2023-06-01 — End: ?

## 2023-06-02 ENCOUNTER — Ambulatory Visit: Admit: 2023-06-02 | Discharge: 2023-06-03 | Payer: PRIVATE HEALTH INSURANCE

## 2023-06-02 MED ORDER — MOUNJARO 5 MG/0.5 ML SUBCUTANEOUS PEN INJECTOR
SUBCUTANEOUS | 4 refills | 0.00000 days | Status: CP
Start: 2023-06-02 — End: ?

## 2023-06-02 MED ORDER — ONDANSETRON 4 MG DISINTEGRATING TABLET
ORAL_TABLET | Freq: Three times a day (TID) | 0 refills | 10.00000 days | Status: CP | PRN
Start: 2023-06-02 — End: ?

## 2023-06-03 DIAGNOSIS — N3090 Cystitis, unspecified without hematuria: Principal | ICD-10-CM

## 2023-06-03 MED ORDER — CEFDINIR 300 MG CAPSULE
ORAL_CAPSULE | Freq: Two times a day (BID) | ORAL | 0 refills | 5.00000 days | Status: CP
Start: 2023-06-03 — End: 2023-06-08

## 2023-06-08 MED ORDER — ESTRADIOL 0.01% (0.1 MG/GRAM) VAGINAL CREAM
VAGINAL | 11 refills | 0.00000 days | Status: CP
Start: 2023-06-08 — End: ?

## 2023-06-14 ENCOUNTER — Ambulatory Visit: Admit: 2023-06-14 | Payer: PRIVATE HEALTH INSURANCE

## 2023-06-20 ENCOUNTER — Emergency Department
Admit: 2023-06-20 | Discharge: 2023-06-20 | Disposition: A | Discharge status: Home / Self Care | Payer: PRIVATE HEALTH INSURANCE

## 2023-06-20 DIAGNOSIS — R7989 Other specified abnormal findings of blood chemistry: Principal | ICD-10-CM

## 2023-06-20 DIAGNOSIS — R3982 Chronic bladder pain: Principal | ICD-10-CM

## 2023-07-07 MED ORDER — ASCORBIC ACID (VITAMIN C) 500 MG TABLET
ORAL_TABLET | Freq: Every day | ORAL | 3 refills | 90.00000 days
Start: 2023-07-07 — End: 2024-07-06

## 2023-07-07 MED ORDER — METHENAMINE HIPPURATE 1 GRAM TABLET
ORAL_TABLET | Freq: Two times a day (BID) | ORAL | 11 refills | 30.00000 days
Start: 2023-07-07 — End: 2024-07-06

## 2023-07-08 MED ORDER — METHENAMINE HIPPURATE 1 GRAM TABLET
ORAL_TABLET | Freq: Two times a day (BID) | ORAL | 11 refills | 30.00000 days | Status: CP
Start: 2023-07-08 — End: 2024-07-07

## 2023-07-08 MED ORDER — ASCORBIC ACID (VITAMIN C) 500 MG TABLET
ORAL_TABLET | Freq: Every day | ORAL | 3 refills | 90.00000 days | Status: CP
Start: 2023-07-08 — End: ?

## 2023-07-08 MED ORDER — PANTOPRAZOLE 40 MG TABLET,DELAYED RELEASE
ORAL_TABLET | Freq: Every day | ORAL | 1 refills | 30.00000 days | Status: CP
Start: 2023-07-08 — End: ?

## 2023-07-08 MED ORDER — MOUNJARO 5 MG/0.5 ML SUBCUTANEOUS PEN INJECTOR
SUBCUTANEOUS | 4 refills | 28.00000 days | Status: CP
Start: 2023-07-08 — End: ?

## 2023-07-18 DIAGNOSIS — E039 Hypothyroidism, unspecified: Principal | ICD-10-CM

## 2023-07-18 MED ORDER — LEVOTHYROXINE 125 MCG TABLET
ORAL_TABLET | Freq: Every day | ORAL | 1 refills | 30.00000 days | Status: CP
Start: 2023-07-18 — End: ?

## 2023-07-30 DIAGNOSIS — K219 Gastro-esophageal reflux disease without esophagitis: Principal | ICD-10-CM

## 2023-07-30 MED ORDER — PANTOPRAZOLE 40 MG TABLET,DELAYED RELEASE
ORAL_TABLET | Freq: Every day | ORAL | 1 refills | 90.00000 days
Start: 2023-07-30 — End: ?

## 2023-08-22 ENCOUNTER — Emergency Department
Admit: 2023-08-22 | Discharge: 2023-08-23 | Disposition: A | Discharge status: Home / Self Care | Payer: PRIVATE HEALTH INSURANCE

## 2023-09-07 DIAGNOSIS — E039 Hypothyroidism, unspecified: Principal | ICD-10-CM

## 2023-09-07 MED ORDER — SYNTHROID 125 MCG TABLET
ORAL_TABLET | Freq: Every day | ORAL | 1 refills | 0.00000 days
Start: 2023-09-07 — End: ?

## 2023-09-08 MED ORDER — SYNTHROID 125 MCG TABLET
ORAL_TABLET | Freq: Every day | ORAL | 1 refills | 90.00000 days | Status: CP
Start: 2023-09-08 — End: ?

## 2023-09-09 ENCOUNTER — Encounter
Admit: 2023-09-09 | Discharge: 2023-09-09 | Payer: PRIVATE HEALTH INSURANCE | Attending: Sports Medicine | Primary: Sports Medicine

## 2023-09-11 DIAGNOSIS — K219 Gastro-esophageal reflux disease without esophagitis: Principal | ICD-10-CM

## 2023-09-11 MED ORDER — PANTOPRAZOLE 40 MG TABLET,DELAYED RELEASE
ORAL_TABLET | Freq: Every day | ORAL | 1 refills | 0.00000 days
Start: 2023-09-11 — End: ?

## 2023-09-12 MED ORDER — PANTOPRAZOLE 40 MG TABLET,DELAYED RELEASE
ORAL_TABLET | Freq: Every day | ORAL | 1 refills | 30.00000 days | Status: CP
Start: 2023-09-12 — End: ?

## 2023-09-26 ENCOUNTER — Other Ambulatory Visit: Payer: Self-pay

## 2023-09-28 DIAGNOSIS — M62838 Other muscle spasm: Principal | ICD-10-CM

## 2023-09-28 MED ORDER — CYCLOBENZAPRINE 10 MG TABLET
ORAL_TABLET | Freq: Three times a day (TID) | ORAL | 3 refills | 30.00000 days | Status: CP | PRN
Start: 2023-09-28 — End: ?

## 2023-09-29 ENCOUNTER — Other Ambulatory Visit: Payer: Self-pay | Admitting: Surgical

## 2023-09-29 ENCOUNTER — Ambulatory Visit: Admit: 2023-09-29 | Payer: PRIVATE HEALTH INSURANCE

## 2023-09-29 MED ORDER — MELOXICAM 7.5 MG TABLET
ORAL_TABLET | 1 refills | 0.00000 days
Start: 2023-09-29 — End: ?

## 2023-10-03 MED ORDER — MELOXICAM 7.5 MG TABLET
ORAL_TABLET | 1 refills | 0.00000 days
Start: 2023-10-03 — End: ?

## 2023-10-28 ENCOUNTER — Other Ambulatory Visit: Payer: Self-pay

## 2023-11-01 ENCOUNTER — Ambulatory Visit: Admit: 2023-11-01 | Discharge: 2023-11-01 | Payer: PRIVATE HEALTH INSURANCE

## 2023-11-01 ENCOUNTER — Inpatient Hospital Stay: Admit: 2023-11-01 | Discharge: 2023-11-01 | Payer: PRIVATE HEALTH INSURANCE

## 2023-11-01 DIAGNOSIS — R300 Dysuria: Principal | ICD-10-CM

## 2023-11-01 DIAGNOSIS — R053 Chronic cough: Principal | ICD-10-CM

## 2023-11-01 DIAGNOSIS — M546 Pain in thoracic spine: Principal | ICD-10-CM

## 2023-11-01 DIAGNOSIS — E063 Autoimmune thyroiditis: Principal | ICD-10-CM

## 2023-11-01 DIAGNOSIS — G8929 Other chronic pain: Principal | ICD-10-CM

## 2023-11-01 DIAGNOSIS — M25561 Pain in right knee: Principal | ICD-10-CM

## 2023-11-01 MED ORDER — PHENAZOPYRIDINE 100 MG TABLET
ORAL_TABLET | Freq: Two times a day (BID) | ORAL | 1 refills | 8.00000 days | Status: CP | PRN
Start: 2023-11-01 — End: ?

## 2023-11-03 DIAGNOSIS — R937 Abnormal findings on diagnostic imaging of other parts of musculoskeletal system: Principal | ICD-10-CM

## 2023-11-10 ENCOUNTER — Inpatient Hospital Stay: Admit: 2023-11-10 | Discharge: 2023-11-10 | Payer: PRIVATE HEALTH INSURANCE

## 2023-11-14 DIAGNOSIS — G8929 Other chronic pain: Principal | ICD-10-CM

## 2023-11-14 DIAGNOSIS — M546 Pain in thoracic spine: Principal | ICD-10-CM

## 2023-11-14 MED ORDER — TIZANIDINE 4 MG TABLET
ORAL_TABLET | Freq: Three times a day (TID) | ORAL | 0 refills | 30.00000 days | Status: CP | PRN
Start: 2023-11-14 — End: 2024-11-13

## 2023-11-16 DIAGNOSIS — E039 Hypothyroidism, unspecified: Principal | ICD-10-CM

## 2023-11-16 MED ORDER — LEVOTHYROXINE 112 MCG TABLET
ORAL_TABLET | Freq: Every day | ORAL | 3 refills | 90.00000 days | Status: CP
Start: 2023-11-16 — End: 2024-11-15

## 2023-11-17 ENCOUNTER — Ambulatory Visit: Admitting: Surgical

## 2023-11-21 ENCOUNTER — Encounter: Payer: Self-pay | Admitting: Radiology

## 2023-11-21 ENCOUNTER — Inpatient Hospital Stay: Admit: 2023-11-21 | Discharge: 2023-11-21 | Payer: PRIVATE HEALTH INSURANCE

## 2023-11-26 DIAGNOSIS — Z794 Long term (current) use of insulin: Principal | ICD-10-CM

## 2023-11-26 DIAGNOSIS — E119 Type 2 diabetes mellitus without complications: Principal | ICD-10-CM

## 2023-11-26 MED ORDER — MOUNJARO 5 MG/0.5 ML SUBCUTANEOUS PEN INJECTOR
4 refills | 0.00000 days
Start: 2023-11-26 — End: ?

## 2023-11-30 MED ORDER — MOUNJARO 5 MG/0.5 ML SUBCUTANEOUS PEN INJECTOR
SUBCUTANEOUS | 4 refills | 0.00000 days | Status: CP
Start: 2023-11-30 — End: ?

## 2023-12-06 DIAGNOSIS — M546 Pain in thoracic spine: Principal | ICD-10-CM

## 2023-12-06 DIAGNOSIS — G8929 Other chronic pain: Principal | ICD-10-CM

## 2023-12-06 MED ORDER — TIZANIDINE 4 MG TABLET
ORAL_TABLET | 1 refills | 0.00000 days
Start: 2023-12-06 — End: ?

## 2023-12-07 DIAGNOSIS — M546 Pain in thoracic spine: Principal | ICD-10-CM

## 2023-12-07 DIAGNOSIS — G8929 Other chronic pain: Principal | ICD-10-CM

## 2023-12-07 MED ORDER — TIZANIDINE 4 MG TABLET
ORAL_TABLET | Freq: Three times a day (TID) | ORAL | 1 refills | 30.00000 days | Status: CP | PRN
Start: 2023-12-07 — End: 2024-12-06

## 2023-12-22 DIAGNOSIS — E039 Hypothyroidism, unspecified: Principal | ICD-10-CM

## 2023-12-22 DIAGNOSIS — E119 Type 2 diabetes mellitus without complications: Principal | ICD-10-CM

## 2023-12-22 DIAGNOSIS — M546 Pain in thoracic spine: Principal | ICD-10-CM

## 2023-12-22 DIAGNOSIS — G8929 Other chronic pain: Principal | ICD-10-CM

## 2023-12-22 DIAGNOSIS — Z794 Long term (current) use of insulin: Principal | ICD-10-CM

## 2023-12-22 MED ORDER — LEVOTHYROXINE 112 MCG TABLET
ORAL_TABLET | Freq: Every day | ORAL | 3 refills | 90.00000 days | Status: CP
Start: 2023-12-22 — End: 2024-12-21

## 2023-12-22 MED ORDER — MOUNJARO 5 MG/0.5 ML SUBCUTANEOUS PEN INJECTOR
SUBCUTANEOUS | 4 refills | 7.00000 days | Status: CP
Start: 2023-12-22 — End: ?

## 2023-12-22 MED ORDER — TIZANIDINE 4 MG TABLET
ORAL_TABLET | Freq: Three times a day (TID) | ORAL | 1 refills | 30.00000 days | Status: CP | PRN
Start: 2023-12-22 — End: 2024-12-21

## 2024-02-02 ENCOUNTER — Ambulatory Visit: Admit: 2024-02-02 | Discharge: 2024-02-03 | Payer: MEDICARE

## 2024-02-02 DIAGNOSIS — E119 Type 2 diabetes mellitus without complications: Principal | ICD-10-CM

## 2024-02-02 DIAGNOSIS — R300 Dysuria: Principal | ICD-10-CM

## 2024-02-03 DIAGNOSIS — M546 Pain in thoracic spine: Principal | ICD-10-CM

## 2024-02-03 DIAGNOSIS — G8929 Other chronic pain: Principal | ICD-10-CM

## 2024-02-03 MED ORDER — TIZANIDINE 4 MG TABLET
ORAL_TABLET | 1 refills | 0.00000 days
Start: 2024-02-03 — End: ?

## 2024-02-07 DIAGNOSIS — N390 Urinary tract infection, site not specified: Principal | ICD-10-CM

## 2024-02-07 MED ORDER — TIZANIDINE 4 MG TABLET
ORAL_TABLET | 1 refills | 0.00000 days | Status: CP
Start: 2024-02-07 — End: ?

## 2024-02-07 MED ORDER — NITROFURANTOIN MONOHYDRATE/MACROCRYSTALS 100 MG CAPSULE
ORAL_CAPSULE | Freq: Two times a day (BID) | ORAL | 0 refills | 7.00000 days | Status: CP
Start: 2024-02-07 — End: ?
